# Patient Record
Sex: Female | Born: 1970 | Race: White | Hispanic: No | Smoking: Never smoker
Health system: Southern US, Academic
[De-identification: ages and names within clinical notes are randomized; demographics above are authoritative.]

## PROBLEM LIST (undated history)

## (undated) DIAGNOSIS — D649 Anemia, unspecified: Secondary | ICD-10-CM

## (undated) DIAGNOSIS — I639 Cerebral infarction, unspecified: Secondary | ICD-10-CM

## (undated) DIAGNOSIS — G4730 Sleep apnea, unspecified: Secondary | ICD-10-CM

## (undated) DIAGNOSIS — E079 Disorder of thyroid, unspecified: Secondary | ICD-10-CM

## (undated) DIAGNOSIS — Z853 Personal history of malignant neoplasm of breast: Secondary | ICD-10-CM

## (undated) DIAGNOSIS — G40909 Epilepsy, unspecified, not intractable, without status epilepticus: Secondary | ICD-10-CM

## (undated) DIAGNOSIS — E119 Type 2 diabetes mellitus without complications: Secondary | ICD-10-CM

## (undated) DIAGNOSIS — Z9989 Dependence on other enabling machines and devices: Secondary | ICD-10-CM

## (undated) DIAGNOSIS — C801 Malignant (primary) neoplasm, unspecified: Secondary | ICD-10-CM

## (undated) DIAGNOSIS — I10 Essential (primary) hypertension: Secondary | ICD-10-CM

## (undated) HISTORY — DX: Personal history of malignant neoplasm of breast: Z85.3

## (undated) HISTORY — DX: Anemia, unspecified: D64.9

## (undated) HISTORY — DX: Malignant (primary) neoplasm, unspecified: C80.1

## (undated) HISTORY — PX: KIDNEY SURGERY: SHX687

## (undated) HISTORY — DX: Essential (primary) hypertension: I10

## (undated) HISTORY — PX: HX HERNIA REPAIR: SHX51

## (undated) HISTORY — PX: GASTROJEJUNOSTOMY W/ JEJUNOSTOMY TUBE: SHX1698

## (undated) HISTORY — DX: Epilepsy, unspecified, not intractable, without status epilepticus: G40.909

## (undated) HISTORY — DX: Disorder of thyroid, unspecified: E07.9

## (undated) HISTORY — PX: HX GALL BLADDER SURGERY/CHOLE: SHX55

## (undated) HISTORY — PX: BRAIN SURGERY: SHX531

## (undated) HISTORY — DX: Type 2 diabetes mellitus without complications: E11.9

---

## 1992-12-02 ENCOUNTER — Other Ambulatory Visit (HOSPITAL_COMMUNITY): Payer: Self-pay

## 2017-01-30 NOTE — Telephone Encounter (Signed)
 Call was transferred from CL to triage nurse. [facility] calling to request copy of letter regarding retro authorization on MRI done 05/21/2016. Have routed to referrals and authorization to assist.  Maylon Cos, RN 01/30/2017 1:41 PM

## 2021-02-27 IMAGING — MR MRI BRAIN WITHOUT AND WITH CONTRAST
8 of 13 series · 25 of 48 positions shown · IV contrast (gadavist)
Comparison: MRI brain from [facility] dated 03/05/2020, & 02/15/2019

﻿EXAM:  [id number]   MRI BRAIN WITHOUT AND WITH CONTRAST
INDICATION: 50-year-old female with prior history of grade 3 astrocytoma of left frontal temporal region diagnosed in 9027 status post radiation therapy.
TECHNIQUE: Axial, coronal and sagittal images including diffusion-weighted sequence, T1 and T2 sequence, FLAIR sequence and postcontrast T1 series after administration of 10 mL Gadavist IV.

[Series 6: DWI · axial · 5.0mm · 1.35mm/px · z∈[-71,+55]mm · 2 of 22 slices shown (1 of 2)]
[im 1/22]
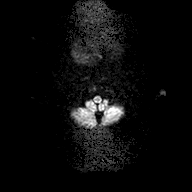
[im 22/22]
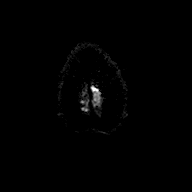

[Series 7: DWI · axial · 5.0mm · 1.35mm/px · z∈[-71,+55]mm · 2 of 21 slices shown (2 of 2)]
[im 1/21]
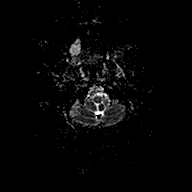
[im 21/21]
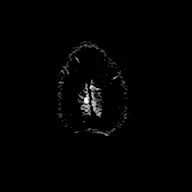

[Series 8: FLAIR · sagittal · 4.0mm · 0.75mm/px · 4 of 26 slices shown (1 of 2)]
[im 1/26]
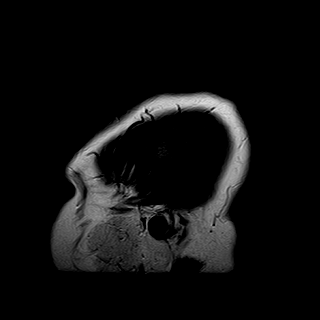
[im 9/26]
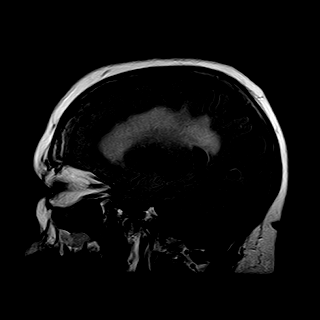
[im 17/26]
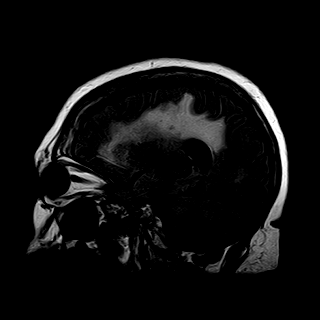
[im 26/26]
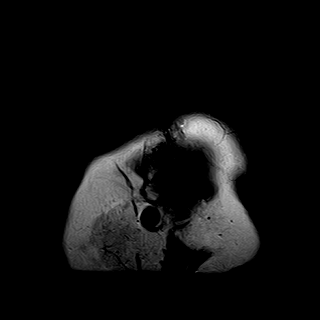

[Series 9: T2 · axial · 5.0mm · 0.43mm/px · z∈[-84,+60]mm · 4 of 25 slices shown (1 of 2)]
[im 1/25]
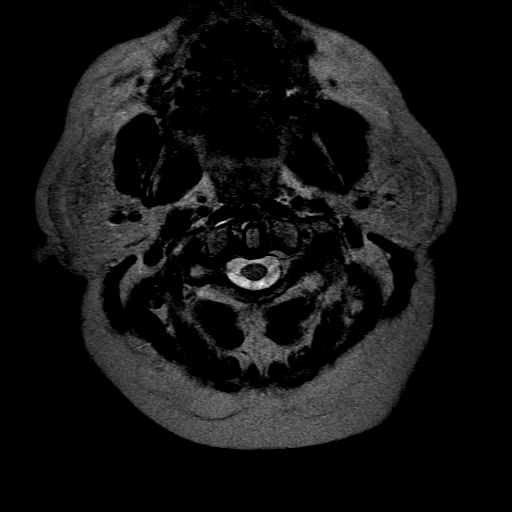
[im 9/25]
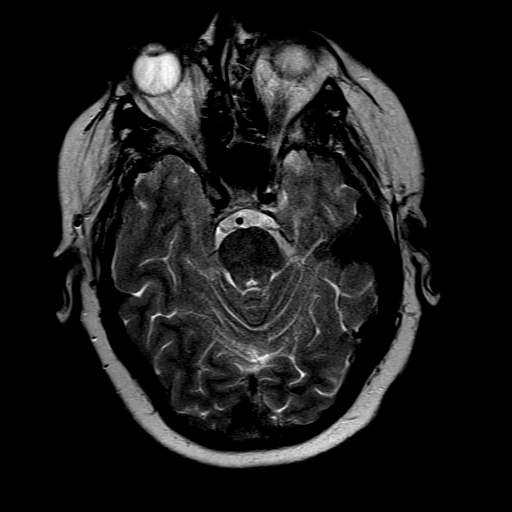
[im 17/25]
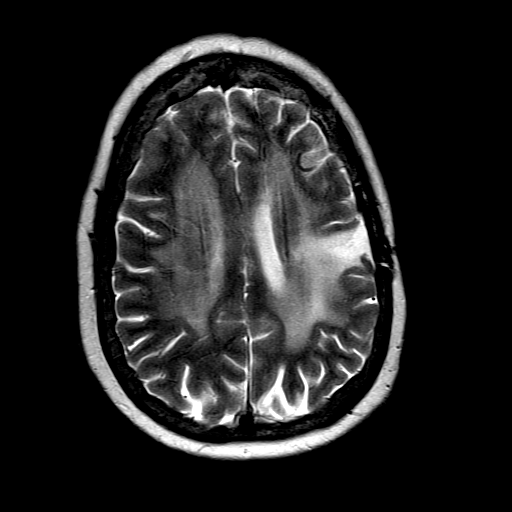
[im 25/25]
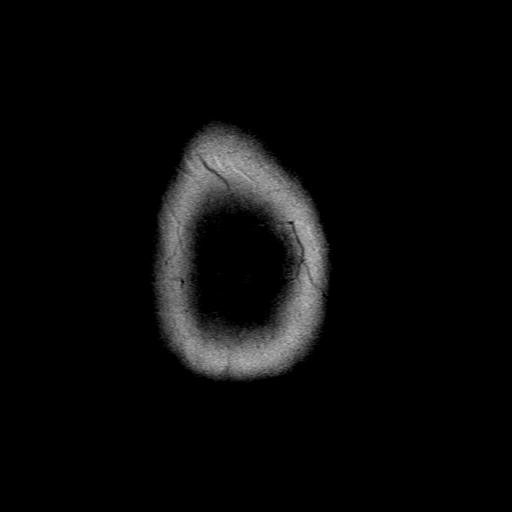

[Series 10: FLAIR · axial · 5.0mm · 0.43mm/px · z∈[-84,+60]mm · 4 of 25 slices shown (2 of 2)]
[im 1/25]
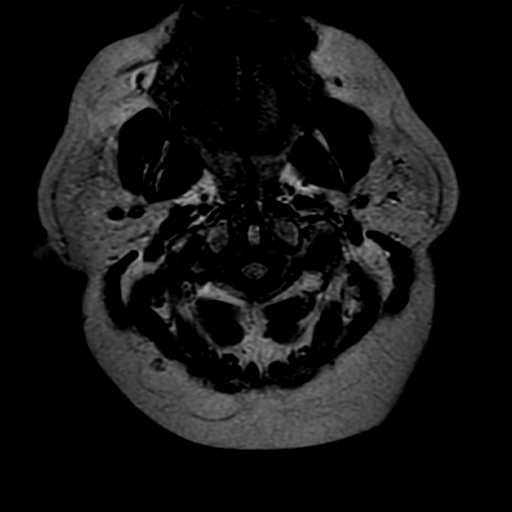
[im 9/25]
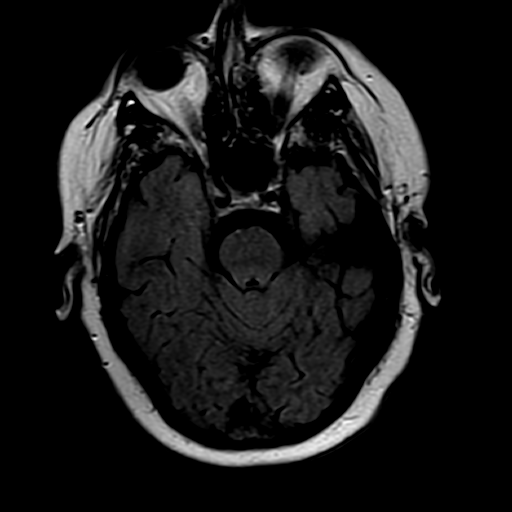
[im 17/25]
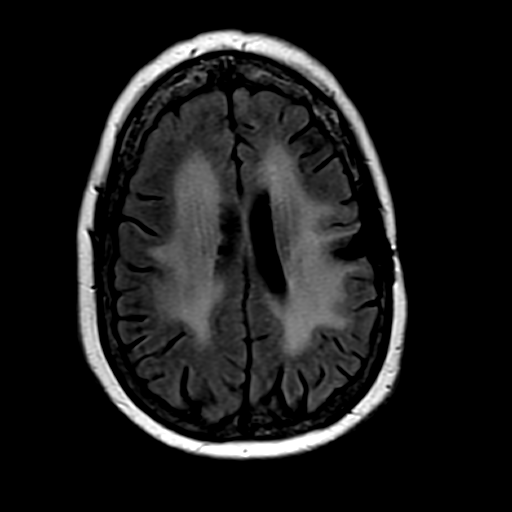
[im 25/25]
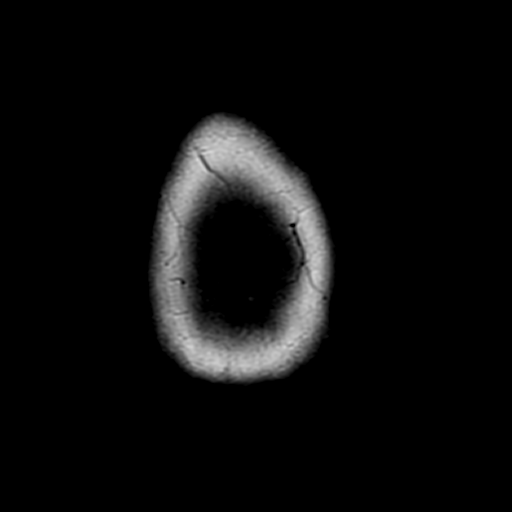

[Series 11: T1 · axial · 5.0mm · 0.43mm/px · z∈[-84,+60]mm · 4 of 25 slices shown]
[im 1/25]
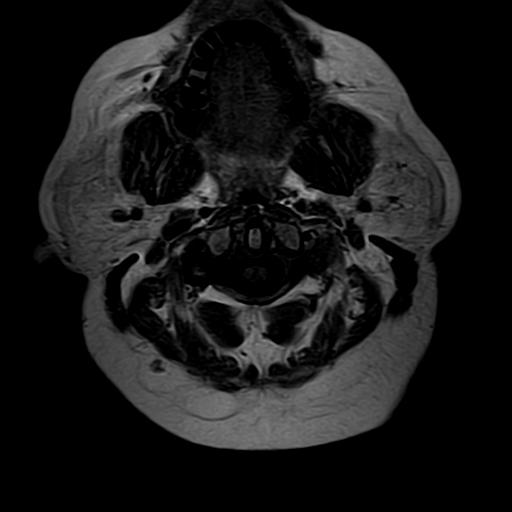
[im 9/25]
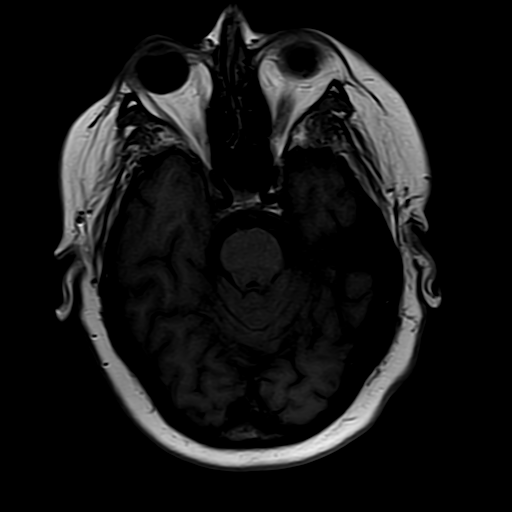
[im 17/25]
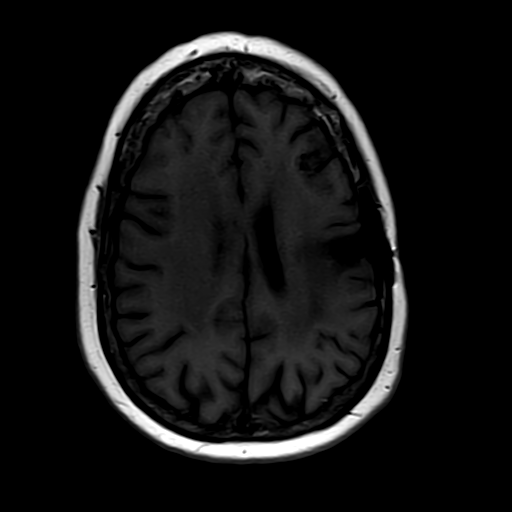
[im 25/25]
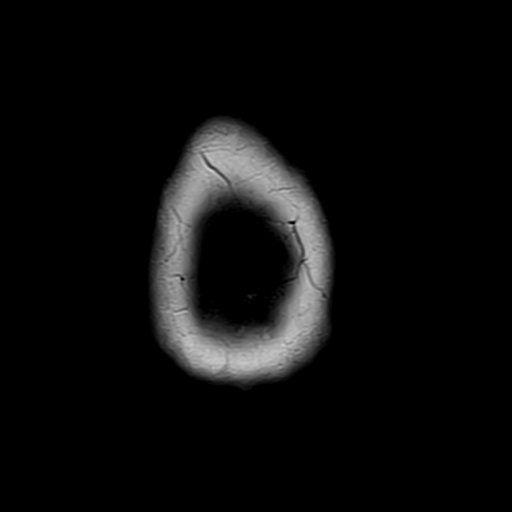

[Series 13: T2 · coronal · 6.0mm · 0.43mm/px · 4 of 24 slices shown (2 of 2)]
[im 1/24]
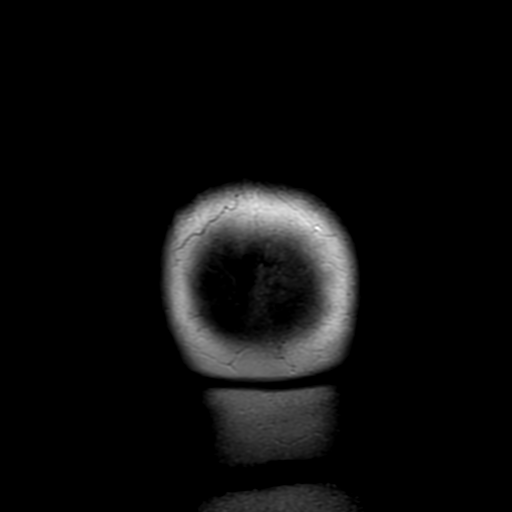
[im 8/24]
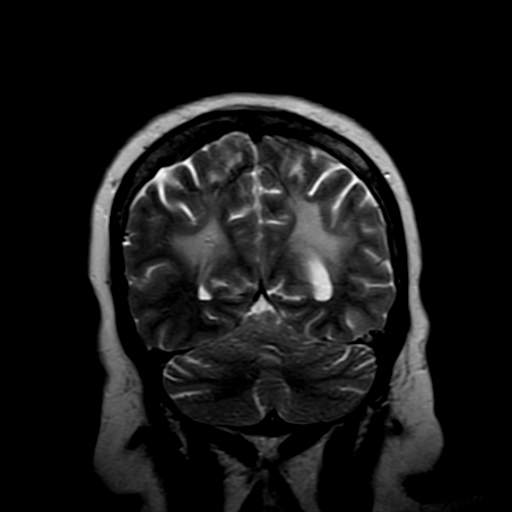
[im 16/24]
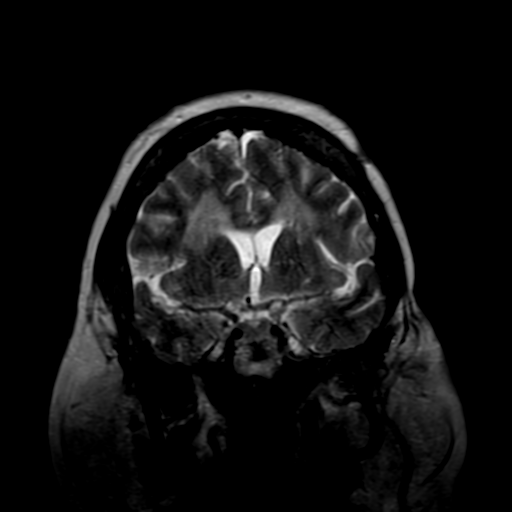
[im 24/24]
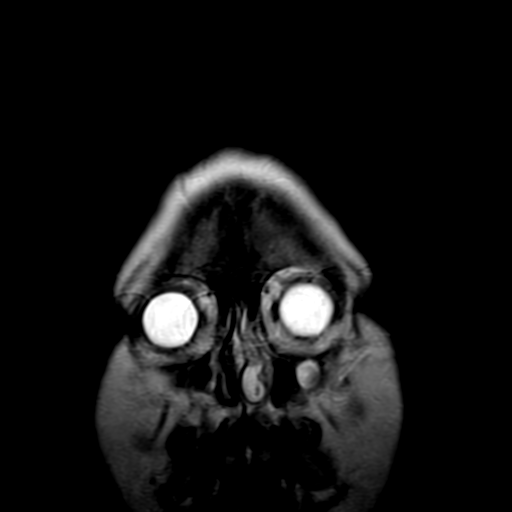

[Series 14: T1 fat-sat · coronal · 5.0mm · 0.57mm/px · 1 of 24 slices shown]
[im 1/24]
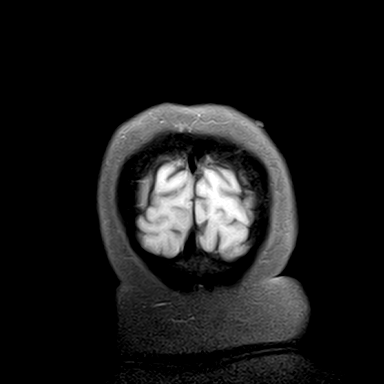

[25 of 48 positions shown; findings below may reference images not displayed]

FINDINGS: No focal areas of restricted diffusion.  No intracranial bleed or extra-axial collections.  No ventriculomegaly or midline shift.  Major arteries of circle of Willis are patent.  

Significant encephalomalacia of the left frontotemporal region is noted.  Residual cavitary lesion measures 2.3 x 1 cm in axial dimensions.  No abnormal enhancement on the postcontrast study.  Dural venous sinuses are patent.
IMPRESSION: 1. Stable encephalomalacia and post radiation changes of left frontotemporal region with stable cystic cavity.  No abnormal enhancement is noted.  

2. Chronic small vessel ischemic change of periventricular white matter on both sides.  No midline shift or intracranial bleed.  

3. No interval change noted from prior examination from [facility] dated 03/05/2020& 02/15/2019.

## 2021-04-05 ENCOUNTER — Other Ambulatory Visit: Payer: Self-pay

## 2021-04-05 ENCOUNTER — Encounter (INDEPENDENT_AMBULATORY_CARE_PROVIDER_SITE_OTHER): Payer: Self-pay

## 2021-04-05 ENCOUNTER — Ambulatory Visit (INDEPENDENT_AMBULATORY_CARE_PROVIDER_SITE_OTHER): Payer: BC Managed Care – PPO | Admitting: Hematology & Oncology

## 2021-04-05 ENCOUNTER — Encounter (INDEPENDENT_AMBULATORY_CARE_PROVIDER_SITE_OTHER): Payer: Self-pay | Admitting: Hematology & Oncology

## 2021-04-05 VITALS — BP 194/94 | HR 98 | Temp 97.9°F | Ht 62.0 in | Wt 220.7 lb

## 2021-04-05 DIAGNOSIS — D496 Neoplasm of unspecified behavior of brain: Secondary | ICD-10-CM

## 2021-04-18 ENCOUNTER — Encounter (INDEPENDENT_AMBULATORY_CARE_PROVIDER_SITE_OTHER): Payer: Self-pay | Admitting: Hematology & Oncology

## 2021-04-18 NOTE — Progress Notes (Signed)
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    HISTORY OF PRESENT ILLNESS:  Elizabeth [name] is a 51 y.o. 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  Patient was in usual state of health until August 2015 when patient had generalized seizure, patient went to the ER ,and  CT scan of the brain August 2015 showed white matter hypodensity within posterior inferior left frontal lobe, concern for malignancy, patient was transferred to [facility] hospital and  had left frontal temporal craniotomy with mapping for resection of the brain lesion on September 10, 2013 and it showed grade 3 anaplastic astrocytoma ,patient saw radiation oncologist Dr. Candee Furbish ,who recommended postoperative radiation along with Temodar which  patient got for 6 months ,patient had some speech difficulty.  Patient had MRI March 05, 2020 was negative and patient was continued on Dilantin ,patient finished radiation and Temodar October 2015.      ROS:  Brain Tumor, hypothyroidism, anemia ,seizure disorder, hypertension.    History:  Past Medical History:   Diagnosis Date   . Anemia    . Cancer (CMS [place])    . Disorder of thyroid    . 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 Essential hypertension            Past Surgical History:   Procedure Laterality Date   . BRAIN SURGERY     . HX CHOLECYSTECTOMY             Social History     Socioeconomic History   . Marital status: Divorced     Spouse name: Not on file   . Number of children: Not on file   . Years of education: Not on file   . Highest education level: Not on file   Occupational History   . Not on file   Tobacco Use   . Smoking status: Never   . Smokeless tobacco: Never   Vaping Use   . Vaping Use: Never used   Substance and  Sexual Activity   . Alcohol use: Never   . Drug use: Never   . Sexual activity: Not on file   Other Topics Concern   . Not on file   Social History Narrative   . Not on file     Social Determinants of Health     Financial Resource Strain: Not on file   Transportation Needs: Not on file   Social Connections: Not on file   Intimate Partner Violence: Not on file   Housing Stability: Not on file       Social History     Social History Narrative   . Not on file       Social History     Substance and Sexual Activity   Drug Use Never       Family Medical History:     Problem Relation (Age of Onset)    Arthritis-osteo Mother    Heart Disease Father    Hypertension (High Blood Pressure) Mother            Current Outpatient Medications   Medication Sig   . ergocalciferol, vitamin D2, (DRISDOL) 1,250 mcg (50,000 unit) Oral Capsule AS DIRECTED ONCE A  WEEK   . escitalopram oxalate (LEXAPRO) 10 mg Oral Tablet Take 1 Tablet (10 mg total) by mouth Every night   . famotidine (PEPCID) 20 mg Oral Tablet Take 1 Tablet (20 mg total) by mouth Once a day   . hydroCHLOROthiazide (HYDRODIURIL) 25 mg Oral Tablet Take 1 Tablet (25 mg total) by mouth Every morning   . levothyroxine (SYNTHROID) 25 mcg Oral Tablet TAKE 1 TABLET ONCE A DAY IN THE MORNING 30 MINUTES BEFORE MEALS OR ANY OTHER MEDICATIONS   . losartan (COZAAR) 100 mg Oral Tablet Take 1 Tablet (100 mg total) by mouth Once a day   . magnesium Oxide 420 mg Oral Tablet Take 1 Tablet (420 mg total) by mouth Once a day   . phenytoin sodium extended release (DILANTIN) 100 mg Oral Capsule TAKE 3 CAPSULES BY MOUTH TWICE A DAY       Allergies   Allergen Reactions   . Ace Inhibitors      Other reaction(s): Angioedema         PHYSICAL EXAM:  Most Recent Vitals    Flowsheet Row Office Visit from 04/05/2021 in Hematology/Oncology, [facility]   Temperature 36.6 C (97.9 F) filed at... 04/05/2021 1522   Heart Rate 98 filed at... 04/05/2021 1522   BP (Non-Invasive) 194/94 filed  at... 04/05/2021 1522   Height 1.575 m ('5\' 2"'$ ) filed at... 04/05/2021 1522   Weight 100 kg (220 lb 11.2 oz) filed at... 04/05/2021 1522   BMI (Calculated) 40.45 filed at... 04/05/2021 1522   BSA (Calculated) 2.09 filed at... 04/05/2021 1522      Physical Exam  Constitutional:       Appearance: Normal appearance.   HENT:      Head: Normocephalic and atraumatic.      Right Ear: Tympanic membrane normal.      Left Ear: Tympanic membrane normal.      Nose: Nose normal.      Mouth/Throat:      Mouth: Mucous membranes are moist.   Eyes:      Extraocular Movements: Extraocular movements intact.      Pupils: Pupils are equal, round, and reactive to light.   Cardiovascular:      Rate and Rhythm: Normal rate and regular rhythm.      Pulses: Normal pulses.      Heart sounds: Normal heart sounds.   Pulmonary:      Effort: Pulmonary effort is normal.      Breath sounds: Normal breath sounds.   Abdominal:      General: Abdomen is flat.   Musculoskeletal:         General: Normal range of motion.      Cervical back: Normal range of motion and neck supple.   Skin:     General: Skin is warm.      Capillary Refill: Capillary refill takes less than 2 seconds.   Neurological:      General: No focal deficit present.      Mental Status: She is alert and oriented to person, place, and time.   Psychiatric:         Mood and Affect: Mood normal.         Behavior: Behavior normal.         Thought Content: Thought content normal.         Judgment: Judgment normal.            ASSESSMENT:  anaplastic astrocytoma.      PLAN:  1. All relative external and internal medical records were reviewed including available H&Ps, progress notes, procedure notes, imaging's, laboratories, and pathology.   2. All labs from last visit were reviewed with the patient including CBC/differential, CMP, LFTs,details of exam finding's discussed.   Patient with history of astrocytoma, had surgery followed by Temodar and radiation, we will check CBC CMP CEA T3,T4 , TSH,  lipid profile follow up in 2 months.    Elizabeth [name] was given the chance to ask questions, and these were answered to their satisfaction. The patient is welcome to call with any questions or concerns in the meantime.     On the day of the encounter, a total of  45 minutes was spent on this patient encounter including review of historical information, examination, documentation and post-visit activities.   No follow-ups on file.     Livia Snellen, MD  04/18/2021, 20:32    CC:  Dan Humphreys, FNP-BC  [place] [id number]    Adolphus Birchwood, [address] [place]  [place],  [place] [address]      This note was partially generated using MModal Fluency Direct system, and there may be some incorrect words, spellings, and punctuation that were not noted in checking the note before saving.

## 2021-05-22 ENCOUNTER — Other Ambulatory Visit (HOSPITAL_COMMUNITY): Payer: Self-pay | Admitting: NURSE PRACTITIONER

## 2021-05-22 DIAGNOSIS — Z1231 Encounter for screening mammogram for malignant neoplasm of breast: Secondary | ICD-10-CM

## 2021-06-07 ENCOUNTER — Ambulatory Visit (HOSPITAL_COMMUNITY): Payer: Self-pay | Admitting: HEMATOLOGY-ONCOLOGY

## 2021-06-14 ENCOUNTER — Encounter (HOSPITAL_COMMUNITY): Payer: Self-pay

## 2021-06-14 ENCOUNTER — Inpatient Hospital Stay
Admission: RE | Admit: 2021-06-14 | Discharge: 2021-06-14 | Disposition: A | Discharge status: Home / Self Care | Payer: BC Managed Care – PPO | Source: Ambulatory Visit | Attending: NURSE PRACTITIONER | Admitting: NURSE PRACTITIONER

## 2021-06-14 ENCOUNTER — Other Ambulatory Visit: Payer: Self-pay

## 2021-06-14 DIAGNOSIS — Z1231 Encounter for screening mammogram for malignant neoplasm of breast: Secondary | ICD-10-CM | POA: Insufficient documentation

## 2021-07-05 ENCOUNTER — Other Ambulatory Visit: Payer: Self-pay

## 2021-07-05 ENCOUNTER — Encounter (HOSPITAL_COMMUNITY): Payer: Self-pay | Admitting: HEMATOLOGY-ONCOLOGY

## 2021-07-05 ENCOUNTER — Ambulatory Visit: Payer: BC Managed Care – PPO | Attending: HEMATOLOGY-ONCOLOGY | Admitting: HEMATOLOGY-ONCOLOGY

## 2021-07-05 VITALS — BP 158/70 | HR 76 | Temp 98.3°F | Ht 62.0 in | Wt 208.3 lb

## 2021-07-05 DIAGNOSIS — Z9221 Personal history of antineoplastic chemotherapy: Secondary | ICD-10-CM | POA: Insufficient documentation

## 2021-07-05 DIAGNOSIS — Z923 Personal history of irradiation: Secondary | ICD-10-CM | POA: Insufficient documentation

## 2021-07-05 DIAGNOSIS — Z85841 Personal history of malignant neoplasm of brain: Secondary | ICD-10-CM | POA: Insufficient documentation

## 2021-07-05 DIAGNOSIS — Z9889 Other specified postprocedural states: Secondary | ICD-10-CM | POA: Insufficient documentation

## 2021-07-05 DIAGNOSIS — Z803 Family history of malignant neoplasm of breast: Secondary | ICD-10-CM | POA: Insufficient documentation

## 2021-07-05 DIAGNOSIS — D496 Neoplasm of unspecified behavior of brain: Secondary | ICD-10-CM

## 2021-07-05 DIAGNOSIS — Z08 Encounter for follow-up examination after completed treatment for malignant neoplasm: Secondary | ICD-10-CM | POA: Insufficient documentation

## 2021-07-05 NOTE — Progress Notes (Signed)
Department of Hematology/Oncology  Progress Note   Name: Elizabeth [name]  GLO:[id number]  Date of Birth: [birth year]  Encounter Date: 07/05/2021    REFERRING PROVIDER:  Dan Humphreys, FNP-BC  [place],   [address]    REASON FOR OFFICE VISIT:  Follow Up (Anaplastic astrocytoma)       HISTORY OF PRESENT ILLNESS:  Elizabeth [name] is a 51 y.o. 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  She was treated with surgery followed by temozolomide plus radiation.  The combination therapy was finished in October 2015.    Clinically, she is been doing well since that time.  She does have some aphasia and word-finding difficulty.    ROS:   Pertinent review of systems as discussed in HPI    HISTORY:  Past Medical History:   Diagnosis Date   . Anemia    . Cancer (CMS [facility])     brain tumor 8/15 with chemo and radiation   . Disorder of thyroid    . 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 Essential hypertension          Past Surgical History:   Procedure Laterality Date   . BRAIN SURGERY     . HX CHOLECYSTECTOMY           Social History     Socioeconomic History   . Marital status: Divorced     Spouse name: Not on file   . Number of children: Not on file   . Years of education: Not on file   . Highest education level: Not on file   Occupational History   . Not on file   Tobacco Use   . Smoking status: Never   . Smokeless tobacco: Never   Vaping Use   . Vaping Use: Never used   Substance and Sexual Activity   . Alcohol use: Never   . Drug use: Never   . Sexual activity: Not on file   Other Topics Concern   . Not on file   Social History Narrative   . Not on file     Social Determinants of Health     Financial Resource Strain: Not on file   Transportation Needs: Not on file   Social Connections: Not on file   Intimate Partner Violence: Not on file   Housing Stability: Not on file     Family Medical History:     Problem Relation (Age of Onset)     Arthritis-osteo Mother    Breast Cancer Paternal Grandmother    Heart Disease Father    Hypertension (High Blood Pressure) Mother    No Known Problems Sister, Brother, Maternal Grandmother, Maternal Grandfather, Paternal Grandfather, Daughter, Son, Maternal Aunt, Maternal Uncle, Paternal Aunt, Paternal Uncle, Other          Current Outpatient Medications   Medication Sig   . atorvastatin (LIPITOR) 20 mg Oral Tablet Take 1 Tablet (20 mg total) by mouth Every night   . clobetasoL (CORMAX) 0.05 % Solution APPLY TO SCALP EVERY DAY TWO WEEKS OUT OF THE MONTH OR LESS   . ergocalciferol, vitamin D2, (DRISDOL) 1,250 mcg (50,000 unit) Oral Capsule AS DIRECTED ONCE A WEEK   . escitalopram oxalate (LEXAPRO) 10 mg Oral Tablet Take 1 Tablet (10 mg total) by mouth Every night   . famotidine (PEPCID) 20 mg Oral Tablet Take 1 Tablet (20 mg total) by mouth Once a  day   . FREESTYLE LANCETS 28 gauge Misc TEST ONCE DAILY   . FREESTYLE LITE METER Kit EVERY DAY   . FREESTYLE LITE STRIPS Strip TEST EVERY DAY   . hydroCHLOROthiazide (HYDRODIURIL) 25 mg Oral Tablet Take 1 Tablet (25 mg total) by mouth Every morning   . ketoconazole (NIZORAL) 2 % Shampoo PLEASE SEE ATTACHED FOR DETAILED DIRECTIONS   . levothyroxine (SYNTHROID) 25 mcg Oral Tablet TAKE 1 TABLET ONCE A DAY IN THE MORNING 30 MINUTES BEFORE MEALS OR ANY OTHER MEDICATIONS   . losartan (COZAAR) 100 mg Oral Tablet Take 1 Tablet (100 mg total) by mouth Once a day   . magnesium Oxide 420 mg Oral Tablet Take 1 Tablet (420 mg total) by mouth Once a day   . metFORMIN (GLUCOPHAGE) 500 mg Oral Tablet Take 1 Tablet (500 mg total) by mouth Twice daily   . phenytoin sodium extended release (DILANTIN) 100 mg Oral Capsule TAKE 3 CAPSULES BY MOUTH TWICE A DAY     Allergies   Allergen Reactions   . Ace Inhibitors      Other reaction(s): Angioedema       PHYSICAL EXAM:  Most Recent Vitals    Flowsheet Row Office Visit from 07/05/2021 in Hematology/Oncology, [facility]    Temperature 36.8 C (98.3 F) filed at... 07/05/2021 1548   Heart Rate 76 filed at... 07/05/2021 1548   Respiratory Rate --   BP (Non-Invasive) 158/70 filed at... 07/05/2021 1548   SpO2 --   Height 1.575 m (5' 2" ) filed at... 07/05/2021 1548   Weight 94.5 kg (208 lb 4.8 oz) filed at... 07/05/2021 1548   BMI (Calculated) 38.18 filed at... 07/05/2021 1548   BSA (Calculated) 2.03 filed at... 07/05/2021 1548      ECOG Status: (1) Restricted in physically strenuous activity, ambulatory and able to do work of light nature   Physical Exam  Constitutional:       General: She is not in acute distress.     Appearance: Normal appearance.   Eyes:      Extraocular Movements: Extraocular movements intact.   Cardiovascular:      Rate and Rhythm: Normal rate and regular rhythm.   Pulmonary:      Effort: Pulmonary effort is normal.   Abdominal:      General: Abdomen is flat.      Palpations: Abdomen is soft.   Musculoskeletal:         General: Normal range of motion.      Cervical back: Normal range of motion.   Skin:     General: Skin is warm and dry.   Neurological:      General: No focal deficit present.      Mental Status: She is alert.   Psychiatric:         Mood and Affect: Mood normal.         DIAGNOSTIC DATA:  No results found for this or any previous visit (from the past 17520 hour(s)).    LABS:   CBC  Diff   No results found for: WBC, WBCJ, HGB, HCT, PLTCNT, SEDRATE, ESR, RBC, MCV, MCHC, MCH, RDW, MPV No results found for: PMNS, LYMPHOCYTES, EOSINOPHIL, MONOCYTES, BASOPHILS, PMNABS, LYMPHSABS, EOSABS, MONOSABS, BASOSABS, BASABS         Comprehensive Metabolic Profile    No results found for: SODIUM, POTASSIUM, CHLORIDE, CO2, ANIONGAP, BUN, CREATININE, ALBUMIN, CALCIUM, GLUCOSENF, GLUCOSEFAST, ALKPHOS, ALT, AST, TOTBILIRUBIN, TOTALPROTEIN       BASIC METABOLIC  PANEL  No results found for: SODIUM, POTASSIUM, CHLORIDE, CO2, ANIONGAP, BUN, CREATININE, BUNCRRATIO, GFR, CALCIUM, GLUCOSENF        ASSESSMENT:  Problem List Items  Addressed This Visit    None  Visit Diagnoses     Brain tumor (CMS [place])    -  Primary           ICD-10-CM    1. Brain tumor (CMS [place])  D49.6            PLAN:   1. All relevant medical records were reviewed including available pertinent provider notes, procedure notes, imaging, laboratory, and pathology.   2. All pertinent labs and/or imaging were reviewed with the patient.   3. Anaplastic astrocytoma:  The patient received definitive treat 8 years ago and has been stable since that time.  At this point, we will see her back every 6 months for ongoing surveillance.    Anyeli Nakama was given the chance to ask questions, and these were answered to their satisfaction. The patient is welcome to call with any questions or concerns in the meantime.     On the day of the encounter, a total of 35 minutes was spent on this patient encounter including review of historical information, examination, documentation and post-visit activities.   Return in about 6 months (around 01/04/2022).     Narda Rutherford, MD  07/05/2021 , 16:57    CC:  [phone] [place] [address]    Dan Humphreys, FNP-BC  [place],  [place] [address]    This note was partially generated using MModal Fluency Direct system, and there may be some incorrect words, spellings, and punctuation that were not noted in checking the note before saving.

## 2021-09-13 ENCOUNTER — Ambulatory Visit (HOSPITAL_BASED_OUTPATIENT_CLINIC_OR_DEPARTMENT_OTHER): Payer: Self-pay

## 2021-09-18 ENCOUNTER — Other Ambulatory Visit: Payer: Self-pay

## 2021-09-20 ENCOUNTER — Other Ambulatory Visit: Payer: Self-pay

## 2021-09-20 ENCOUNTER — Inpatient Hospital Stay
Admission: RE | Admit: 2021-09-20 | Discharge: 2021-09-20 | Disposition: A | Discharge status: Still In Hospital | Payer: BC Managed Care – PPO | Source: Ambulatory Visit | Attending: Radiation Oncology | Admitting: Radiation Oncology

## 2021-09-20 NOTE — Progress Notes (Signed)
Name: Elizabeth [name] MRN:  [id number]   Date: 09/20/2021 Age: 51 y.o.        TUMOR:  Grade 3 left frontotemporal astrocytoma.    TREATMENT INTERVAL:  November 23, 2013 she completed a 2 phase IMRT plan delivering 45 Gy to the initial volume and 59.4 Gy to the boost volume along with concurrent temozolomide chemotherapy.    CHIEF COMPLAINT:  Routine follow-up.    INTERVAL ONCOLOGIC HISTORY:  I saw her on September 07, 2020.  She continues to have an expressive aphasia and mild receptive aphasia as well as right leg weakness.  She is had no further seizures and no visual problems or headaches.    OTHER MEDICAL PROBLEMS:  She has been started on medication for diabetes.  She continues on medication for seizures, thyroid replacement, depression, blood pressure, and reflux.    PAST MEDICAL HISTORY    ALLERGIES:   Allergies   Allergen Reactions    Ace Inhibitors      Other reaction(s): Angioedema        MEDICATIONS:   Current Outpatient Medications   Medication Instructions    atorvastatin (LIPITOR) 20 mg, Oral, NIGHTLY    clobetasoL (CORMAX) 0.05 % Solution APPLY TO SCALP EVERY DAY TWO WEEKS OUT OF THE MONTH OR LESS    ergocalciferol, vitamin D2, (DRISDOL) 1,250 mcg (50,000 unit) Oral Capsule AS DIRECTED ONCE A WEEK    escitalopram oxalate (LEXAPRO) 10 mg, Oral, NIGHTLY    famotidine (PEPCID) 20 mg, Oral, DAILY    FREESTYLE LANCETS 28 gauge Misc TEST ONCE DAILY    FREESTYLE LITE METER Kit EVERY DAY    FREESTYLE LITE STRIPS Strip TEST EVERY DAY    hydroCHLOROthiazide (HYDRODIURIL) 25 mg, Oral, EVERY MORNING    ketoconazole (NIZORAL) 2 % Shampoo PLEASE SEE ATTACHED FOR DETAILED DIRECTIONS    levothyroxine (SYNTHROID) 25 mcg Oral Tablet TAKE 1 TABLET ONCE A DAY IN THE MORNING 30 MINUTES BEFORE MEALS OR ANY OTHER MEDICATIONS    losartan (COZAAR) 100 mg, Oral, DAILY    magnesium Oxide 420 mg, Oral, DAILY    metFORMIN (GLUCOPHAGE) 500 mg, Oral, 2 TIMES DAILY    phenytoin sodium extended release (DILANTIN) 100 mg Oral Capsule TAKE 3  CAPSULES BY MOUTH TWICE A DAY        INTERVAL SURGERIES:   Past Surgical History:   Procedure Laterality Date    BRAIN SURGERY      HX CHOLECYSTECTOMY          REVIEW OF SYSTEMS  General: No anorexia, weight loss, chills, night sweats, or documented fevers.  Eyes: No loss of acuity, diplopia, cataracts, glaucoma, macular degeneration.  Ears: No loss of acuity, tinnitus, otalgia, eustachian tube dysfunction.  Endocrine: No diabetes, thyroid problems, corticosteroid use.  Respiratory: No coughing, wheezing, shortness of breath, asthma, emphysema, tuberculosis, black lung, asbestos exposure.  Cardiovascular: No syncope, palpitations, angina, hypertension, hyperlipidemia, myocardial infarction, deep vein thrombosis, history of rheumatic fever.  GI: No dysphagia, abdominal pain, nausea, vomiting, diarrhea, constipation, rectal bleeding.  GU: No kidney stones, urinary infections, urinary leakage.  Musculoskeletal: No back pain, joint pain, broken bones.  Psychiatric: No anxiety, depression.  Neurologic: No headaches, stroke, seizures, paresthesias, weakness.      PHYSICAL EXAMINATION  General:  Ill-appearing  Performance Status:  ECOG 1  Vital Signs: There were no vitals taken for this visit.   Skin: No jaundice, urticaria, pigmented lesions, or radiation changes.  Oral: No visible or palpable lesions of the oral cavity or oropharynx.  Extremities: No clubbing, cyanosis, edema, or arthritic deformities.  Psychiatric: Alert, oriented, and cooperative, with appropriate judgment and affect.  Neurologic: Cranial nerves II through XII intact.  Motor strength 5/5 and equal.  Rapid alternating motions are unimpaired. Gait normal.    RADIOGRAPHS:   MRI brain report February 26, 2021 described stable encephalomalacia with a residual 10 x 23 mm left frontotemporal cavity.  There was no change from prior studies and no evidence of recurrent brain tumor.    COMMENT:  It has been 8 years from diagnosis of brain tumor in August  2015.    She appears clinically disease-free.      She is due for another MRI in January.  I will see her a couple of weeks after that.      Charlotte Crumb, MD       ML:[id number]@

## 2021-09-20 NOTE — Addendum Note (Signed)
Encounter addended by: Milas Kocher, RN on: 09/20/2021 1:45 PM   Actions taken: Flowsheet accepted

## 2022-01-03 ENCOUNTER — Encounter (HOSPITAL_COMMUNITY): Payer: Self-pay | Admitting: NURSE PRACTITIONER

## 2022-01-03 ENCOUNTER — Ambulatory Visit: Payer: BC Managed Care – PPO | Attending: NURSE PRACTITIONER | Admitting: NURSE PRACTITIONER

## 2022-01-03 ENCOUNTER — Other Ambulatory Visit: Payer: Self-pay

## 2022-01-03 VITALS — BP 166/78 | HR 78 | Temp 96.2°F | Ht 62.0 in | Wt 204.5 lb

## 2022-01-03 DIAGNOSIS — Z85841 Personal history of malignant neoplasm of brain: Secondary | ICD-10-CM | POA: Insufficient documentation

## 2022-01-03 DIAGNOSIS — Z853 Personal history of malignant neoplasm of breast: Secondary | ICD-10-CM | POA: Insufficient documentation

## 2022-01-03 DIAGNOSIS — R4701 Aphasia: Secondary | ICD-10-CM | POA: Insufficient documentation

## 2022-01-03 DIAGNOSIS — Z803 Family history of malignant neoplasm of breast: Secondary | ICD-10-CM | POA: Insufficient documentation

## 2022-01-03 DIAGNOSIS — Z923 Personal history of irradiation: Secondary | ICD-10-CM | POA: Insufficient documentation

## 2022-01-03 DIAGNOSIS — Z08 Encounter for follow-up examination after completed treatment for malignant neoplasm: Secondary | ICD-10-CM | POA: Insufficient documentation

## 2022-01-03 DIAGNOSIS — D496 Neoplasm of unspecified behavior of brain: Secondary | ICD-10-CM

## 2022-01-03 NOTE — Cancer Center Note (Signed)
Department of Hematology/Oncology  Progress Note   Name: Elizabeth [name]  BEE:[id number]  Date of Birth: 1970-11-11  Encounter Date: 01/03/2022    REFERRING PROVIDER:  No referring provider defined for this encounter.    REASON FOR OFFICE VISIT:  Follow Up (Brain tumor)       HISTORY OF PRESENT ILLNESS:  Elizabeth [name] is a 51 y.o. 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  She was treated with surgery followed by temozolomide plus radiation.  The combination therapy was finished in October 2015.    Clinically, she is been doing well since that time.  She does have some aphasia and word-finding difficulty. She reports some right lower back pain that developed this am upon awakening and believes it occurred while helping her daughter bathe. She additionally added she has intermittent chest pain that comes and goes without any chest pain occurences today. She also adds she recently discontinued her prescribed levothyroxine ordered per her PCP without there knowledge.     ROS:   Pertinent review of systems as discussed in HPI    HISTORY:  Past Medical History:   Diagnosis Date    Anemia     Cancer (CMS [facility])     brain tumor 8/15 with chemo and radiation    Disorder of thyroid     Epileptic seizure (CMS HCC)     Essential hypertension     Hx of breast cancer        Past Surgical History:   Procedure Laterality Date    BRAIN SURGERY      HX CHOLECYSTECTOMY           Social History     Socioeconomic History    Marital status: Divorced     Spouse name: Not on file    Number of children: Not on file    Years of education: Not on file    Highest education level: Not on file   Occupational History    Not on file   Tobacco Use    Smoking status: Never    Smokeless tobacco: Never   Vaping Use    Vaping Use: Never used   Substance and Sexual Activity    Alcohol use: Never    Drug use: Never    Sexual activity: Not on file   Other Topics  Concern    Not on file   Social History Narrative    Not on file     Social Determinants of Health     Financial Resource Strain: Not on file   Transportation Needs: Not on file   Social Connections: Not on file   Intimate Partner Violence: Not on file   Housing Stability: Not on file     Family Medical History:       Problem Relation (Age of Onset)    Arthritis-osteo Mother    Breast Cancer Paternal Grandmother    Heart Disease Father    Hypertension (High Blood Pressure) Mother    No Known Problems Sister, Brother, Maternal Grandmother, Maternal Grandfather, Paternal Grandfather, Daughter, Son, Maternal Aunt, Maternal Uncle, Paternal Aunt, Paternal Uncle, Other            Current Outpatient Medications   Medication Sig    atorvastatin (LIPITOR) 20 mg Oral Tablet Take 1 Tablet (20 mg total) by mouth Every night    clobetasoL (CORMAX) 0.05 % Solution APPLY TO SCALP EVERY DAY TWO WEEKS OUT  OF THE MONTH OR LESS    ergocalciferol, vitamin D2, (DRISDOL) 1,250 mcg (50,000 unit) Oral Capsule AS DIRECTED ONCE A WEEK    escitalopram oxalate (LEXAPRO) 10 mg Oral Tablet Take 1 Tablet (10 mg total) by mouth Every night    famotidine (PEPCID) 20 mg Oral Tablet Take 1 Tablet (20 mg total) by mouth Once a day    FREESTYLE LANCETS 28 gauge Misc TEST ONCE DAILY    FREESTYLE LITE METER Kit EVERY DAY    FREESTYLE LITE STRIPS Strip TEST EVERY DAY    hydroCHLOROthiazide (HYDRODIURIL) 25 mg Oral Tablet Take 1 Tablet (25 mg total) by mouth Every morning    ketoconazole (NIZORAL) 2 % Shampoo PLEASE SEE ATTACHED FOR DETAILED DIRECTIONS    levothyroxine (SYNTHROID) 25 mcg Oral Tablet TAKE 1 TABLET ONCE A DAY IN THE MORNING 30 MINUTES BEFORE MEALS OR ANY OTHER MEDICATIONS    losartan (COZAAR) 100 mg Oral Tablet Take 1 Tablet (100 mg total) by mouth Once a day    magnesium Oxide 420 mg Oral Tablet Take 1 Tablet (420 mg total) by mouth Once a day    metFORMIN (GLUCOPHAGE) 500 mg Oral Tablet Take 1 Tablet (500 mg total) by mouth Twice daily     methocarbamoL (ROBAXIN) 500 mg Oral Tablet Take 1 Tablet (500 mg total) by mouth Three times a day as needed    phenytoin sodium extended release (DILANTIN) 100 mg Oral Capsule TAKE 3 CAPSULES BY MOUTH TWICE A DAY     Allergies   Allergen Reactions    Ace Inhibitors      Other reaction(s): Angioedema       PHYSICAL EXAM:  BP (!) 166/78 (Site: Left, Patient Position: Sitting, Cuff Size: Adult)   Pulse 78   Temp (!) 35.7 C (96.2 F) (Temporal)   Ht 1.575 m (_0 )   Wt 92.8 kg (204 lb 8 oz)   SpO2 99%   BMI 37.40 kg/m        ECOG Status: (1) Restricted in physically strenuous activity, ambulatory and able to do work of light nature   Physical Exam  Constitutional:       General: She is not in acute distress.     Appearance: Normal appearance.   Eyes:      Extraocular Movements: Extraocular movements intact.   Cardiovascular:      Rate and Rhythm: Normal rate and regular rhythm.   Pulmonary:      Effort: Pulmonary effort is normal.   Abdominal:      Palpations: Abdomen is soft.      Tenderness: There is abdominal tenderness (Right upper quadrant and left flank). There is no right CVA tenderness or left CVA tenderness.   Musculoskeletal:         General: Normal range of motion.      Cervical back: Normal range of motion.   Skin:     General: Skin is warm and dry.   Neurological:      General: No focal deficit present.      Mental Status: She is oriented to person, place, and time.      Gait: Gait abnormal (Right side cane use).   Psychiatric:         Mood and Affect: Mood normal. Affect is flat.         Cognition and Memory: Memory is impaired.      Comments: Dysphagia Chronic and unchanged            ASSESSMENT:  Problem List Items Addressed This Visit    None  Visit Diagnoses       Brain tumor (CMS [facility])    -  Primary               ICD-10-CM    1. Brain tumor (CMS [place])  D49.6              PLAN:   1. All relevant medical records were reviewed including available pertinent provider notes, procedure notes,  imaging, laboratory, and pathology.   2. Anaplastic astrocytoma:  The patient received definitive treat 8 years ago and has been stable since that time.  At this point, we will see her back every 6 months for ongoing surveillance.    Renelle Lodato was given the chance to ask questions, and these were answered to their satisfaction. The patient is welcome to call with any questions or concerns in the meantime.     On the day of the encounter, a total of 32 minutes was spent on this patient encounter including review of historical information, examination, documentation and post-visit activities.   Return in about 6 months (around 07/04/2022).     Juwann Sherk, APRN,FNP-BC ,01/03/2022 ,14:18     CC:  [facility]  [address]  [place] [address]    No referring provider defined for this encounter.    This note was partially generated using MModal Fluency Direct system, and there may be some incorrect words, spellings, and punctuation that were not noted in checking the note before saving.

## 2022-02-27 ENCOUNTER — Other Ambulatory Visit: Payer: Self-pay

## 2022-02-27 ENCOUNTER — Other Ambulatory Visit: Payer: BC Managed Care – PPO | Attending: RADIATION ONCOLOGY

## 2022-02-27 DIAGNOSIS — C719 Malignant neoplasm of brain, unspecified: Secondary | ICD-10-CM | POA: Insufficient documentation

## 2022-02-28 LAB — BUN: BUN: 14 mg/dL (ref 7–25)

## 2022-02-28 LAB — CREATININE WITH EGFR
CREATININE: 0.61 mg/dL (ref 0.60–1.30)
ESTIMATED GFR: 107 mL/min/{1.73_m2} (ref >59)

## 2022-03-04 IMAGING — MR MRI BRAIN WITHOUT AND WITH CONTRAST
11 series · 42 of 48 positions shown · IV contrast (gadavist)
Comparison: MRI brain dated 02/27/2021, 03/05/2020 with and without contrast.

﻿EXAM:  MRI BRAIN WITHOUT AND WITH CONTRAST
INDICATION: 51-year-old with diagnosis of astrocytoma status post surgery and radiation, chemotherapy in 7425.  Follow-up evaluation.
TECHNIQUE: Multiplanar, multisequential MRI of the brain was performed without and with 10 mL of Gadavist.

[Series 5: DWI · axial · 5.0mm · 1.35mm/px · z∈[-60,+66]mm · 9 of 88 slices shown (1 of 3)]
[im 1/88]
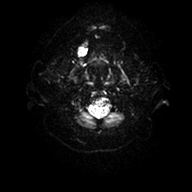
[im 16/88]
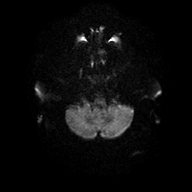
[im 24/88]
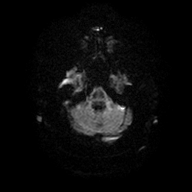
[im 40/88]
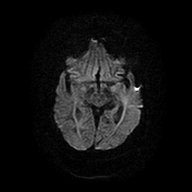
[im 48/88]
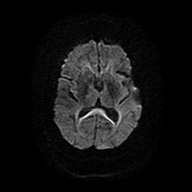
[im 64/88]
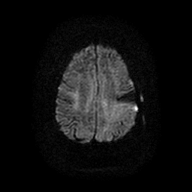
[im 72/88]
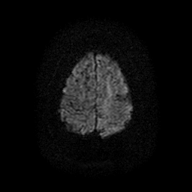
[im 80/88]
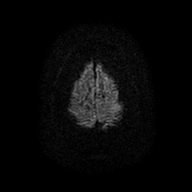
[im 88/88]
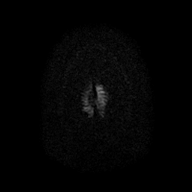

[Series 6: DWI · axial · 5.0mm · 1.35mm/px · z∈[-60,+66]mm · 2 of 22 slices shown (2 of 3)]
[im 1/22]
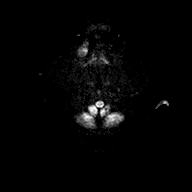
[im 22/22]
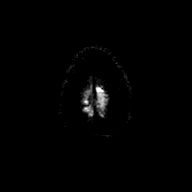

[Series 7: DWI · axial · 5.0mm · 1.35mm/px · z∈[-60,+66]mm · 3 of 22 slices shown (3 of 3)]
[im 1/22]
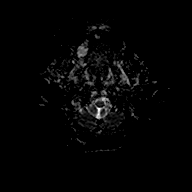
[im 11/22]
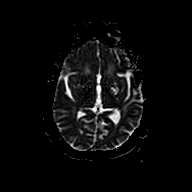
[im 22/22]
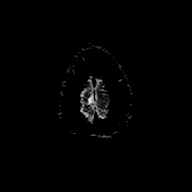

[Series 8: FLAIR · sagittal · 4.0mm · 0.75mm/px · 4 of 26 slices shown (1 of 2)]
[im 1/26]
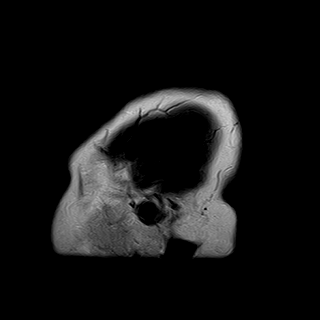
[im 9/26]
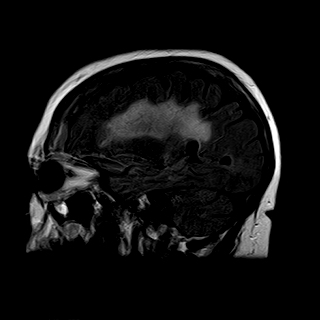
[im 17/26]
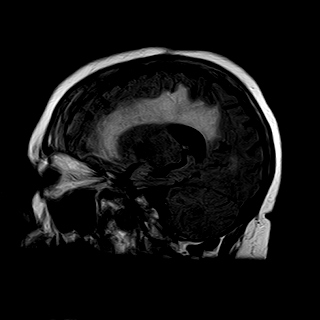
[im 26/26]
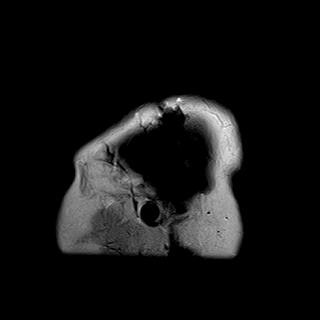

[Series 9: T2 · axial · 5.0mm · 0.43mm/px · z∈[-64,+74]mm · 4 of 24 slices shown (1 of 2)]
[im 1/24]
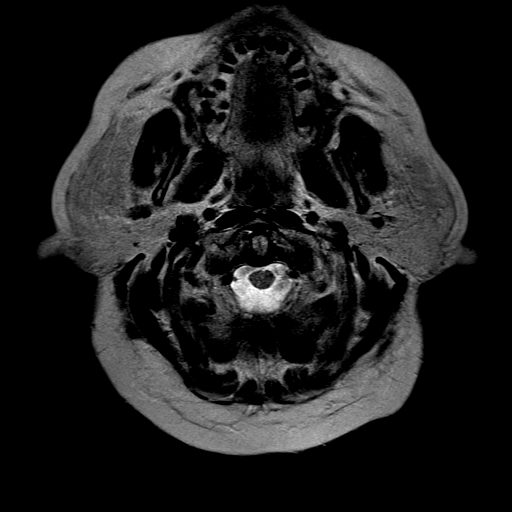
[im 8/24]
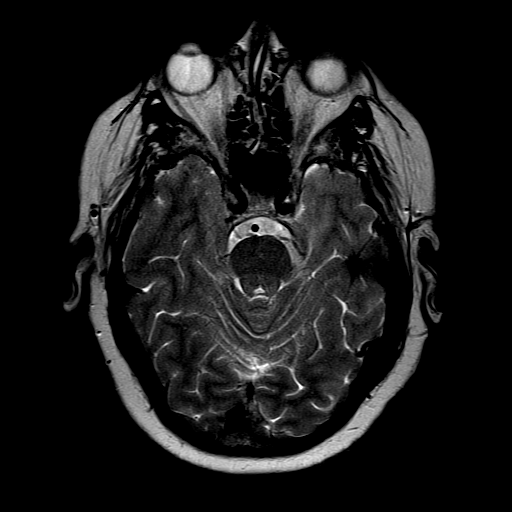
[im 16/24]
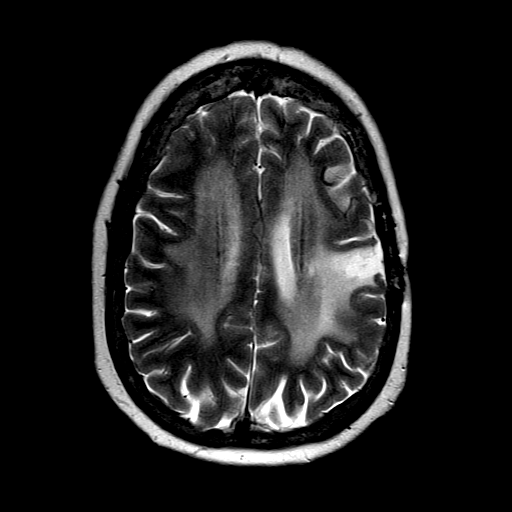
[im 24/24]
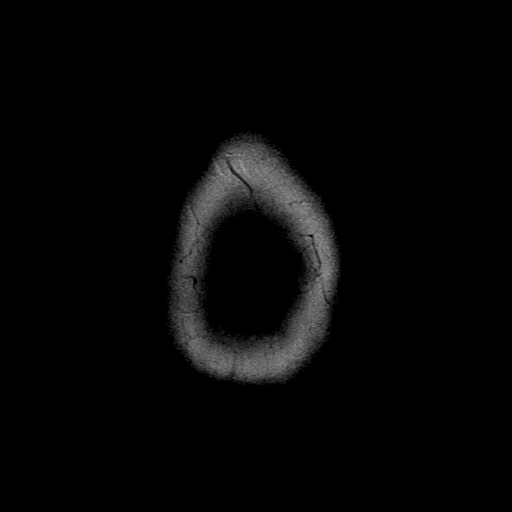

[Series 10: T2-star · axial · 5.0mm · 0.69mm/px · 1 of 24 slices shown]
[im 1/24]
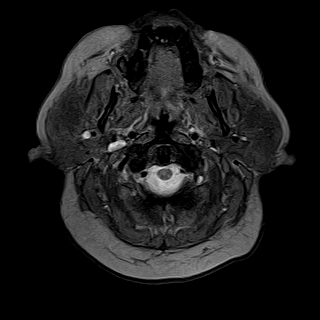

[Series 11: FLAIR · axial · 5.0mm · 0.76mm/px · z∈[-58,+68]mm · 3 of 22 slices shown (2 of 2)]
[im 1/22]
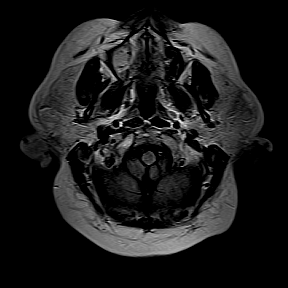
[im 11/22]
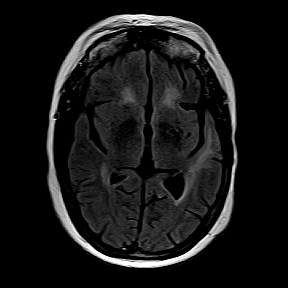
[im 22/22]
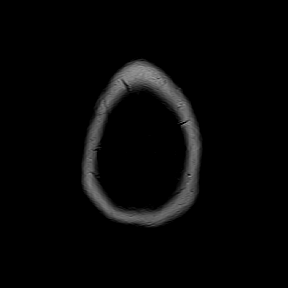

[Series 12: T2 · coronal · 6.0mm · 0.43mm/px · 4 of 24 slices shown (2 of 2)]
[im 1/24]
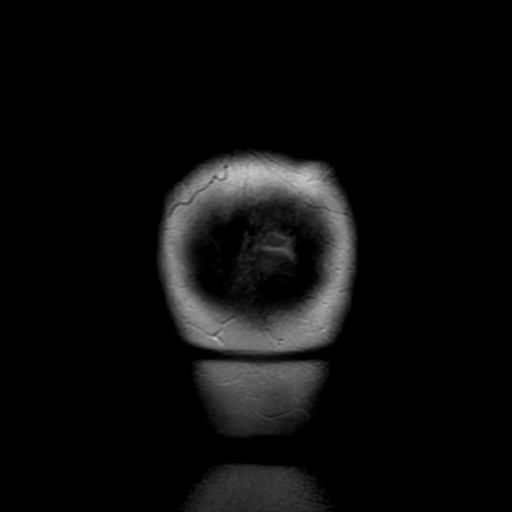
[im 8/24]
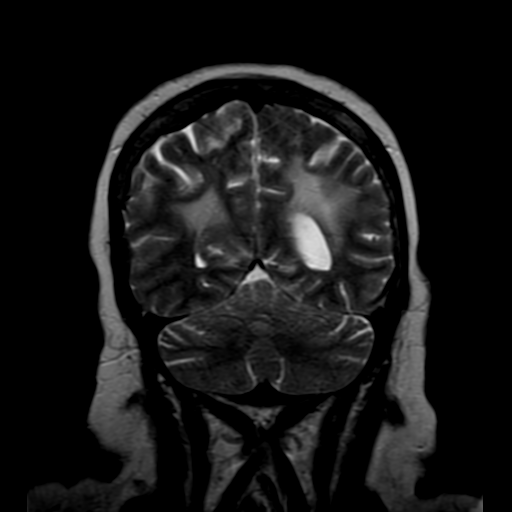
[im 16/24]
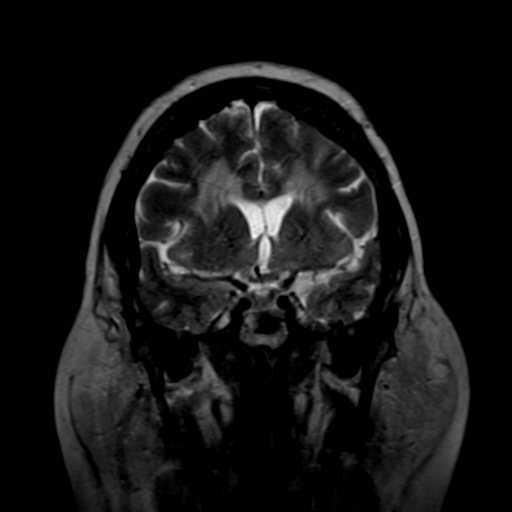
[im 24/24]
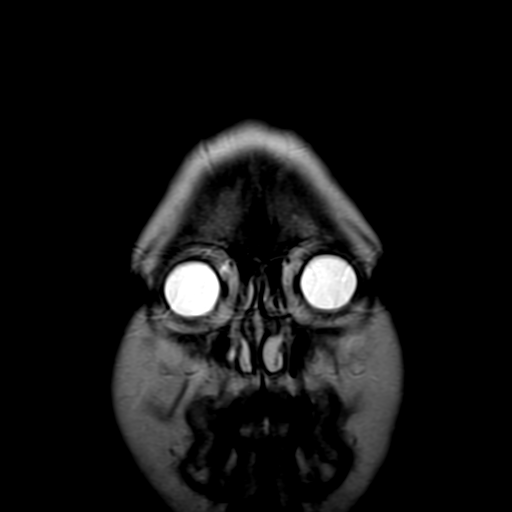

[Series 13: T1 · axial · non-contrast · 5.0mm · 0.69mm/px · z∈[-64,+74]mm · 4 of 24 slices shown (1 of 2)]
[im 1/24]
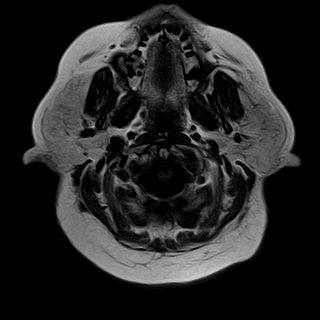
[im 8/24]
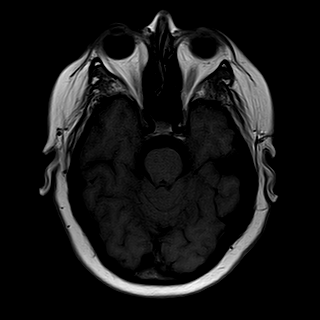
[im 16/24]
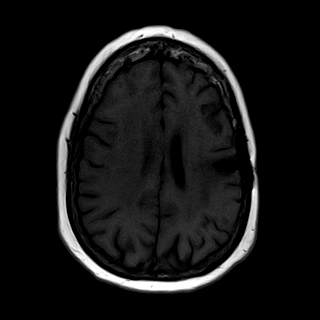
[im 24/24]
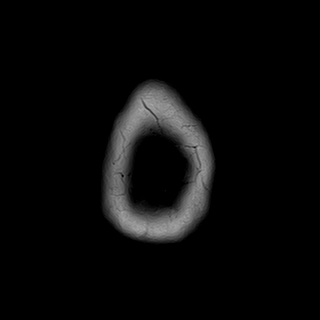

[Series 15: T1 fat-sat post-contrast · coronal · 5.0mm · 0.57mm/px · 4 of 24 slices shown]
[im 1/24]
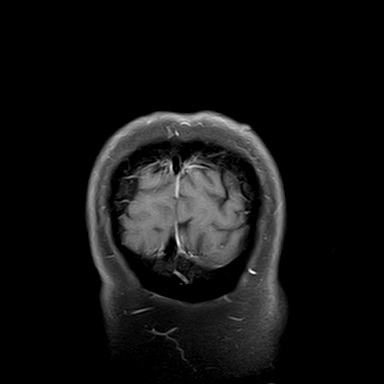
[im 8/24]
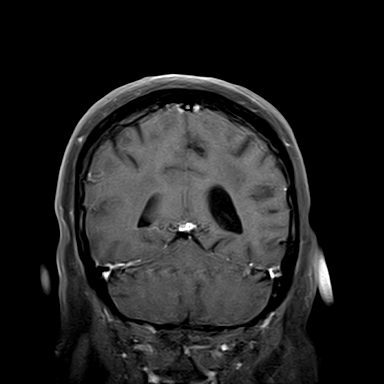
[im 16/24]
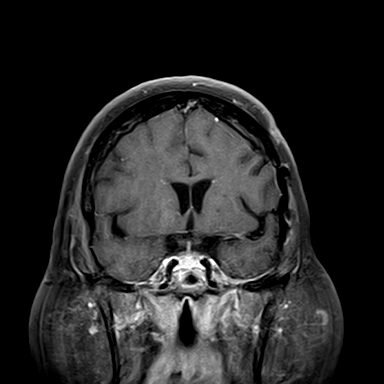
[im 24/24]
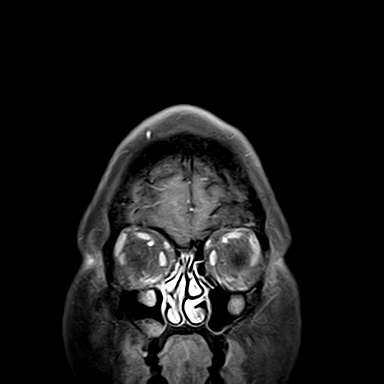

[Series 16: T1 · axial · 5.0mm · 0.69mm/px · z∈[-79,+59]mm · 4 of 24 slices shown (2 of 2)]
[im 1/24]
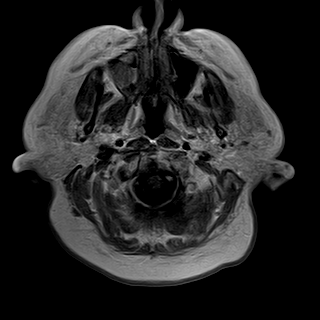
[im 8/24]
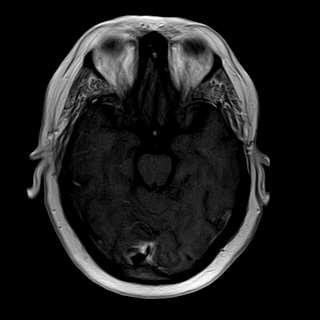
[im 16/24]
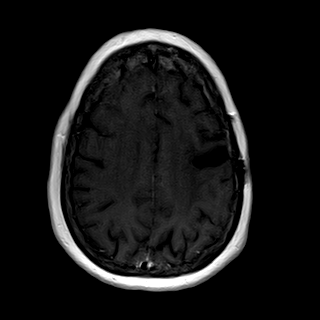
[im 24/24]
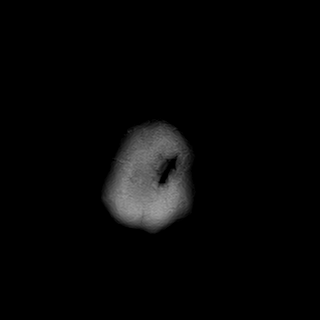

[42 of 48 positions shown; findings below may reference images not displayed]

FINDINGS: No focal areas of restricted diffusion on diffusion sequence.  Stable encephalomalacia of left frontoparietal region representing postsurgical changes and postradiation changes. No abnormal enhancement is noted in this region on postcontrast study.

Chronic ischemic change of periventricular white matter on both sides.  Major arteries of circle of Willis and dural venous sinuses are patent.  No ventriculomegaly or midline shift.
IMPRESSION: 1. Stable chronic encephalomalacia representing posttreatment changes of left frontoparietal region.  No abnormal enhancement is noted on postcontrast study.

2. Stable chronic small-vessel ischemic change of periventricular white matter.  No significant change in the MRI findings from 03/05/2020 and 02/27/2021.

## 2022-03-11 ENCOUNTER — Ambulatory Visit (HOSPITAL_BASED_OUTPATIENT_CLINIC_OR_DEPARTMENT_OTHER): Payer: Self-pay

## 2022-03-15 ENCOUNTER — Inpatient Hospital Stay
Admission: RE | Admit: 2022-03-15 | Discharge: 2022-03-15 | Disposition: A | Discharge status: Still In Hospital | Payer: BC Managed Care – PPO | Source: Ambulatory Visit | Attending: RADIATION ONCOLOGY | Admitting: RADIATION ONCOLOGY

## 2022-03-15 ENCOUNTER — Other Ambulatory Visit: Payer: Self-pay

## 2022-03-15 NOTE — Progress Notes (Signed)
Name: Elizabeth [name] MRN:  [id number]   Date: 03/15/2022 Age: 52 y.o.       TUMOR:  Astrocytoma post resection and adjuvant chemoradiation completed 2015    TREATMENT INTERVAL:  9 years    INTERVAL ONCOLOGIC HISTORY:  Elizabeth [name] 52 year old who is now 9 years post treatment for primary astrocytoma.  She reports stable difficulty with word finding.  She has some mild balance issues.  She has no new headache or other new complaints.  MRI was performed January 29th [facility] that showed stable findings.  There is no new contrast enhancing areas.    OTHER MEDICAL PROBLEMS:     There are no exam notes on file for this visit.     PAST MEDICAL HISTORY    Past Medical History:   Diagnosis Date    Anemia     Cancer (CMS [facility])     brain tumor 8/15 with chemo and radiation    Disorder of thyroid     Epileptic seizure (CMS HCC)     Essential hypertension     Hx of breast cancer         ALLERGIES:   Allergies   Allergen Reactions    Ace Inhibitors      Other reaction(s): Angioedema        MEDICATIONS:   Current Outpatient Medications   Medication Instructions    atorvastatin (LIPITOR) 20 mg, Oral, NIGHTLY    clobetasoL (CORMAX) 0.05 % Solution APPLY TO SCALP EVERY DAY TWO WEEKS OUT OF THE MONTH OR LESS    ergocalciferol, vitamin D2, (DRISDOL) 1,250 mcg (50,000 unit) Oral Capsule AS DIRECTED ONCE A WEEK    escitalopram oxalate (LEXAPRO) 10 mg, Oral, NIGHTLY    famotidine (PEPCID) 20 mg, Oral, DAILY    FREESTYLE LANCETS 28 gauge Misc TEST ONCE DAILY    FREESTYLE LITE METER Kit EVERY DAY    FREESTYLE LITE STRIPS Strip TEST EVERY DAY    hydroCHLOROthiazide (HYDRODIURIL) 25 mg, Oral, EVERY MORNING    ketoconazole (NIZORAL) 2 % Shampoo PLEASE SEE ATTACHED FOR DETAILED DIRECTIONS    levothyroxine (SYNTHROID) 25 mcg Oral Tablet TAKE 1 TABLET ONCE A DAY IN THE MORNING 30 MINUTES BEFORE MEALS OR ANY OTHER MEDICATIONS    losartan (COZAAR) 100 mg, Oral, DAILY    magnesium Oxide 420 mg, Oral, DAILY    metFORMIN (GLUCOPHAGE) 500 mg,  Oral, 2 TIMES DAILY    methocarbamoL (ROBAXIN) 500 mg, Oral, 3 TIMES DAILY PRN    Ozempic 0.25 mg, Subcutaneous, EVERY 7 DAYS    phenytoin sodium extended release (DILANTIN) 100 mg Oral Capsule TAKE 3 CAPSULES BY MOUTH TWICE A DAY        INTERVAL SURGERIES:   Past Surgical History:   Procedure Laterality Date    BRAIN SURGERY      HX CHOLECYSTECTOMY          FAMILY HISTORY: Reviewed and unchanged    SOCIAL HISTORY: Reviewed and unchanged    REVIEW OF SYSTEMS  Pertinent review of systems as discussed in interval oncological history      PHYSICAL EXAMINATION  Physical Exam  Eyes:      Extraocular Movements: Extraocular movements intact.      Pupils: Pupils are equal, round, and reactive to light.   Neurological:      Mental Status: She is alert and oriented to person, place, and time. Mental status is at baseline.          RADIOGRAPHS:   MRI reviewed  LABORATORY RESULT:  None  ASSESSMENT & PLAN  52 year old with primary brain tumor 9 years out from chemoradiation in the adjuvant setting.  She is doing well without any evidence of recurrence radiographically or clinically.  We will repeat MRI in 1 year in follow up and see her at that time.  She is instructed to call in the interim should she have problems or concerns.    Adah Salvage, MD  03/15/2022 11:54     KT:[id number]

## 2022-07-04 ENCOUNTER — Ambulatory Visit (HOSPITAL_COMMUNITY): Payer: Self-pay | Admitting: NURSE PRACTITIONER

## 2023-02-18 ENCOUNTER — Other Ambulatory Visit (HOSPITAL_BASED_OUTPATIENT_CLINIC_OR_DEPARTMENT_OTHER): Payer: Self-pay | Admitting: RADIATION ONCOLOGY

## 2023-02-18 DIAGNOSIS — C719 Malignant neoplasm of brain, unspecified: Secondary | ICD-10-CM

## 2023-02-28 ENCOUNTER — Ambulatory Visit: Payer: BC Managed Care – PPO | Attending: RADIATION ONCOLOGY

## 2023-02-28 ENCOUNTER — Other Ambulatory Visit: Payer: Self-pay

## 2023-02-28 DIAGNOSIS — C719 Malignant neoplasm of brain, unspecified: Secondary | ICD-10-CM | POA: Insufficient documentation

## 2023-02-28 LAB — CREATININE WITH EGFR
CREATININE: 0.62 mg/dL (ref 0.60–1.30)
ESTIMATED GFR: 106 mL/min/{1.73_m2} (ref >59)

## 2023-03-09 ENCOUNTER — Ambulatory Visit
Admission: RE | Admit: 2023-03-09 | Discharge: 2023-03-09 | Disposition: A | Discharge status: Home / Self Care | Payer: BC Managed Care – PPO | Source: Ambulatory Visit | Attending: RADIATION ONCOLOGY | Admitting: RADIATION ONCOLOGY

## 2023-03-09 ENCOUNTER — Other Ambulatory Visit: Payer: Self-pay

## 2023-03-09 DIAGNOSIS — C719 Malignant neoplasm of brain, unspecified: Secondary | ICD-10-CM | POA: Insufficient documentation

## 2023-03-09 MED ORDER — GADOBUTROL 10 MMOL/10 ML (1 MMOL/ML) INTRAVENOUS SOLUTION
10.0000 mL | INTRAVENOUS | Status: AC
Start: 2023-03-09 — End: 2023-03-09
  Administered 2023-03-09: 9 mL via INTRAVENOUS

## 2023-03-10 ENCOUNTER — Ambulatory Visit (HOSPITAL_BASED_OUTPATIENT_CLINIC_OR_DEPARTMENT_OTHER): Payer: BC Managed Care – PPO

## 2023-03-21 ENCOUNTER — Ambulatory Visit (HOSPITAL_BASED_OUTPATIENT_CLINIC_OR_DEPARTMENT_OTHER): Payer: Self-pay

## 2023-04-15 ENCOUNTER — Emergency Department (HOSPITAL_BASED_OUTPATIENT_CLINIC_OR_DEPARTMENT_OTHER)

## 2023-04-15 ENCOUNTER — Ambulatory Visit
Admission: RE | Admit: 2023-04-15 | Discharge: 2023-04-15 | Disposition: A | Discharge status: Still In Hospital | Payer: BC Managed Care – PPO | Source: Ambulatory Visit | Attending: RADIATION ONCOLOGY | Admitting: RADIATION ONCOLOGY

## 2023-04-15 ENCOUNTER — Emergency Department: Admission: EM | Admit: 2023-04-15 | Discharge: 2023-04-15 | Disposition: A | Discharge status: Home / Self Care

## 2023-04-15 ENCOUNTER — Other Ambulatory Visit: Payer: Self-pay

## 2023-04-15 ENCOUNTER — Ambulatory Visit: Payer: BC Managed Care – PPO

## 2023-04-15 ENCOUNTER — Telehealth (INDEPENDENT_AMBULATORY_CARE_PROVIDER_SITE_OTHER): Payer: Self-pay | Admitting: UROLOGY

## 2023-04-15 ENCOUNTER — Encounter (HOSPITAL_BASED_OUTPATIENT_CLINIC_OR_DEPARTMENT_OTHER): Payer: Self-pay

## 2023-04-15 DIAGNOSIS — N2889 Other specified disorders of kidney and ureter: Secondary | ICD-10-CM

## 2023-04-15 DIAGNOSIS — N201 Calculus of ureter: Secondary | ICD-10-CM

## 2023-04-15 DIAGNOSIS — N1330 Unspecified hydronephrosis: Secondary | ICD-10-CM

## 2023-04-15 DIAGNOSIS — N132 Hydronephrosis with renal and ureteral calculous obstruction: Secondary | ICD-10-CM

## 2023-04-15 DIAGNOSIS — Z85841 Personal history of malignant neoplasm of brain: Secondary | ICD-10-CM | POA: Insufficient documentation

## 2023-04-15 LAB — CBC WITH DIFF
BASOPHIL #: 0.02 10*3/uL (ref 0.00–0.10)
BASOPHIL %: 0 % (ref 0–1)
EOSINOPHIL #: 0.21 10*3/uL (ref 0.00–0.50)
EOSINOPHIL %: 2 % (ref 1–7)
HCT: 36.1 % (ref 31.2–41.9)
HGB: 12.4 g/dL (ref 10.9–14.3)
LYMPHOCYTE #: 2.93 10*3/uL (ref 1.10–3.10)
LYMPHOCYTE %: 32 % (ref 16–46)
MCH: 33.9 pg — ABNORMAL HIGH (ref 24.7–32.8)
MCHC: 34.4 g/dL (ref 32.3–35.6)
MCV: 98.6 fL — ABNORMAL HIGH (ref 75.5–95.3)
MONOCYTE #: 0.77 10*3/uL (ref 0.20–0.90)
MONOCYTE %: 8 % (ref 4–11)
MPV: 7.2 fL — ABNORMAL LOW (ref 7.9–10.8)
NEUTROPHIL #: 5.19 10*3/uL (ref 1.90–8.20)
NEUTROPHIL %: 57 % (ref 43–77)
PLATELETS: 225 10*3/uL (ref 140–440)
RBC: 3.66 10*6/uL (ref 3.63–4.92)
RDW: 15.3 % (ref 12.3–17.7)
WBC: 9.1 10*3/uL (ref 3.8–11.8)

## 2023-04-15 LAB — LACTIC ACID LEVEL W/ REFLEX FOR LEVEL >2.0: LACTIC ACID: 1.2 mmol/L (ref 0.4–2.0)

## 2023-04-15 LAB — URINALYSIS, MACRO/MICRO
BILIRUBIN: NEGATIVE mg/dL
GLUCOSE: NEGATIVE mg/dL
KETONES: NEGATIVE mg/dL
NITRITE: NEGATIVE
PH: 6 (ref 4.6–8.0)
SPECIFIC GRAVITY: 1.025 (ref 1.003–1.035)
UROBILINOGEN: 0.2 mg/dL (ref 0.2–1.0)

## 2023-04-15 LAB — COMPREHENSIVE METABOLIC PANEL, NON-FASTING
ALBUMIN/GLOBULIN RATIO: 1.2 (ref 0.8–1.4)
ALBUMIN: 3.9 g/dL (ref 3.4–5.0)
ALKALINE PHOSPHATASE: 146 U/L — ABNORMAL HIGH (ref 46–116)
ALT (SGPT): 47 U/L (ref <=78)
ANION GAP: 9 mmol/L (ref 4–13)
AST (SGOT): 21 U/L (ref 15–37)
BILIRUBIN TOTAL: 0.4 mg/dL (ref 0.2–1.0)
BUN/CREA RATIO: 16
BUN: 11 mg/dL (ref 7–18)
CALCIUM, CORRECTED: 9.3 mg/dL
CALCIUM: 9.2 mg/dL (ref 8.5–10.1)
CHLORIDE: 105 mmol/L (ref 98–107)
CO2 TOTAL: 30 mmol/L (ref 21–32)
CREATININE: 0.69 mg/dL (ref 0.55–1.02)
ESTIMATED GFR: 103 mL/min/{1.73_m2} (ref >59)
GLOBULIN: 3.2
GLUCOSE: 104 mg/dL (ref 74–106)
OSMOLALITY, CALCULATED: 287 mosm/kg (ref 270–290)
POTASSIUM: 3.4 mmol/L — ABNORMAL LOW (ref 3.5–5.1)
PROTEIN TOTAL: 7.1 g/dL (ref 6.4–8.2)
SODIUM: 144 mmol/L (ref 136–145)

## 2023-04-15 LAB — URINALYSIS, MICROSCOPIC

## 2023-04-15 MED ORDER — TAMSULOSIN 0.4 MG CAPSULE
0.4000 mg | ORAL_CAPSULE | ORAL | Status: AC
Start: 2023-04-15 — End: 2023-04-15
  Administered 2023-04-15: 0.4 mg via ORAL

## 2023-04-15 MED ORDER — KETOROLAC 30 MG/ML (1 ML) INJECTION SOLUTION
30.0000 mg | INTRAMUSCULAR | Status: AC
Start: 2023-04-15 — End: 2023-04-15
  Administered 2023-04-15: 30 mg via INTRAMUSCULAR

## 2023-04-15 MED ORDER — LIDOCAINE HCL 10 MG/ML (1 %) INJECTION SOLUTION
1.0000 g | Freq: Once | INTRAMUSCULAR | Status: AC
Start: 2023-04-15 — End: 2023-04-15
  Administered 2023-04-15: 1 g via INTRAMUSCULAR

## 2023-04-15 MED ORDER — CEFDINIR 300 MG CAPSULE
300.0000 mg | ORAL_CAPSULE | Freq: Two times a day (BID) | ORAL | 0 refills | Status: AC
Start: 2023-04-15 — End: 2023-04-22

## 2023-04-15 MED ORDER — KETOROLAC 30 MG/ML (1 ML) INJECTION SOLUTION
INTRAMUSCULAR | Status: AC
Start: 2023-04-15 — End: 2023-04-15
  Filled 2023-04-15: qty 1

## 2023-04-15 MED ORDER — KETOROLAC 30 MG/ML (1 ML) INJECTION SOLUTION
30.0000 mg | INTRAMUSCULAR | Status: DC
Start: 2023-04-15 — End: 2023-04-15

## 2023-04-15 MED ORDER — TAMSULOSIN 0.4 MG CAPSULE
ORAL_CAPSULE | ORAL | Status: AC
Start: 2023-04-15 — End: 2023-04-15
  Filled 2023-04-15: qty 1

## 2023-04-15 MED ORDER — TAMSULOSIN 0.4 MG CAPSULE
0.4000 mg | ORAL_CAPSULE | Freq: Every evening | ORAL | 0 refills | Status: DC
Start: 2023-04-15 — End: 2023-07-16

## 2023-04-15 MED ORDER — CEFTRIAXONE 1 GRAM SOLUTION FOR INJECTION
INTRAMUSCULAR | Status: AC
Start: 2023-04-15 — End: 2023-04-15
  Filled 2023-04-15: qty 10

## 2023-04-15 MED ORDER — HYDROCODONE 5 MG-ACETAMINOPHEN 325 MG TABLET
1.0000 | ORAL_TABLET | Freq: Four times a day (QID) | ORAL | 0 refills | Status: DC | PRN
Start: 2023-04-15 — End: 2023-07-16

## 2023-04-15 MED ORDER — ONDANSETRON 4 MG DISINTEGRATING TABLET
4.0000 mg | ORAL_TABLET | Freq: Three times a day (TID) | ORAL | 0 refills | Status: DC | PRN
Start: 2023-04-15 — End: 2023-07-16

## 2023-04-15 NOTE — Telephone Encounter (Signed)
 Patient presented to the emergency room with flank pain he has a history of astrocytoma treated by Oncology but a incidental finding she has a large left renal mass.  Can we please make arrangements in the next week or 2 to see this patient and have her referred to the appropriate facility    Patient also has a right 3 mm distal stone she is placed on medical expulsive therapy    Thank you    Sharlene Motts

## 2023-04-15 NOTE — ED Provider Notes (Signed)
 [facility]  ED Primary Provider Note      Name: Elizabeth [name]  Age and Gender: 53 y.o. female  Date of Birth: 1970/05/03  MRN: [id number]  PCP: [facility]    CC:  Chief Complaint   Patient presents with    Flank Pain     Rt flank pain couple day, says urine is coming out as a trickle        HPI:  Elizabeth [name] is a 53 y.o. White female who presents to the ER with right flank pain.  Patient states that she has had flank pain that radiates down for the last couple of days has taken Tylenol and Motrin for this without relief of pain. Reports that she is having difficulty urinating, reports it is just a trickle.  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 Has seen Dr. Joslyn Devon, follows with him yearly, he sent her here to be evaluated. Denies any fever, chills, nausea vomiting or diarrhea.    Below pertinent information reviewed with patient:  Past Medical History:   Diagnosis Date    Anemia     Cancer (CMS HCC)     brain tumor 8/15 with chemo and radiation    Disorder of thyroid     Epileptic seizure (CMS HCC)     Essential hypertension     Hx of breast cancer        Allergies   Allergen Reactions    Ace Inhibitors      Other reaction(s): Angioedema       Past Surgical History:   Procedure Laterality Date    BRAIN SURGERY      HX CHOLECYSTECTOMY          Social History     Socioeconomic History    Marital status: Divorced   Tobacco Use    Smoking status: Never    Smokeless tobacco: Never   Vaping Use    Vaping status: Never Used   Substance and Sexual Activity    Alcohol use: Never    Drug use: Never       ROS:  No other overt positive review of systems are noted other than stated in the HPI.      Objective:    ED Triage Vitals [04/15/23 1400]   BP (Non-Invasive) (!) 163/75   Heart Rate 78   Respiratory Rate 17   Temperature 36.4 C (97.6 F)   SpO2 100 %   Weight 93 kg (205 lb)   Height 1.575 m (5\' 2" )     Filed Vitals:    04/15/23  1400   BP: (!) 163/75   Pulse: 78   Resp: 17   Temp: 36.4 C (97.6 F)   SpO2: 100%       Nursing notes and vital signs reviewed.    Constitutional - No acute distress.  Alert and Active.  HEENT - Normocephalic. Atraumatic. PERRL. EOMI. Conjunctiva clear.  Moist mucous membranes.   Neck - Trachea midline. No stridor. No hoarseness.  Cardiac - Regular rate and rhythm. No murmurs, rubs, or gallops. Intact distal pulses.  Respiratory/Chest - Normal respiratory effort. Clear to auscultation bilaterally. No rales, wheezes or rhonchi. No chest tenderness.  Abdomen - Normal bowel sounds. Non-tender, soft, non-distended. Right CVA tenderness  Musculoskeletal - Good AROM. No muscle or joint tenderness appreciated. No clubbing, cyanosis or edema.  Skin - Warm and dry, without any rashes or other lesions.  Neuro -  Alert and oriented x 3. Cranial nerves II-XII are grossly intact.  Moving all extremities symmetrically. Normal gait.  Psych - Normal mood and affect. Behavior is normal        Any pertinent labs and imaging obtained during this encounter reviewed below in MDM.    MDM/ED Course:    Medical Decision Making  Patient presents to the ER with right flank pain.  Patient states that she has had flank pain that radiates down for the last couple of days has taken Tylenol and Motrin for this without relief of pain. Reports that she is having difficulty urinating, reports it is just a trickle.  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 Has seen Dr. Joslyn Devon, follows with him yearly, he sent her here to be evaluated. Denies any fever, chills, nausea vomiting or diarrhea.    Differential diagnoses include:  Urinary tract infection, ureteral calculus, hydronephrosis, pyelonephritis    Patient underwent diagnostics with results as noted in ED course.    Patient was found to have right distal ureter calculus measuring 3.2 mm with mild hydronephrosis.  CBC was unremarkable.  CMP was  unremarkable.  Lactic acid was within normal limits.  CT scan did show the obstructing distal right ureter calculus.  CT also showed a 14 cm left renal mass which should be considered renal cell carcinoma until proven otherwise.  I spoke with on-call Urology Dr. Sharlene Motts, who states he will call the clinic and schedule a close follow up with this patient.  I discussed the findings with the patient, she states that she has had concern for quite some time that there was something there.  Dr. Daphine Deutscher office also called regarding his results and I will let them know that she has a urologic close follow up.  Regarding the kidney stone, patient will be discharged with medical expulsive therapy including Flomax, Norco, Zofran and cefdinir.  PDMP Reviewed. Patient received Toradol 30 mg intramuscularly, Rocephin 1 g intramuscularly, and Flomax in the emergency department.  We are covering for bacteria in her urine.  Encouraged patient to follow up with Urology office if she has not heard from them in the next couple of days.  I did place an inpatient referral for care.  Strict ER return precautions given.  Patient agreeable to treatment course.  All questions answered.    Patient will be discharged in stable condition at this time.    Amount and/or Complexity of Data Reviewed  Labs: ordered. Decision-making details documented in ED Course.  Radiology: ordered. Decision-making details documented in ED Course.  ECG/medicine tests: independent interpretation performed.    Risk  OTC drugs.  Prescription drug management.          ED Course as of 04/15/23 2053   Tue Apr 15, 2023   1433 CBC/DIFF(!)  WBCs 9.1, hemoglobin 12.4, hematocrit 36.1   1509 COMPREHENSIVE METABOLIC PANEL, NON-FASTING(!)  Na 144, K 3.4, BUN 11, creatinine 0.69   1509 LACTIC ACID: 1.2   1527 CT RENAL CALCULI SERIES WO IV CONTRAST  1.A 3.2 MM OBSTRUCTING DISTAL RIGHT URETERAL CALCULUS WITH MILD RIGHT HYDRONEPHROSIS.  2.A 14 CM EXOPHYTIC LEFT RENAL MASS SHOULD BE  CONSIDERED RENAL CELL CARCINOMA UNTIL PROVEN OTHERWISE. A 15 MM SOFT TISSUE NODULE NEAR THE LATERAL MARGIN OF THE MASS MAY REPRESENT A SPLENULE ALTHOUGH A SATELLITE NODULE CAN NOT BE EXCLUDED.  3.AN 8 MM LEFT PARAAORTIC LYMPH NODE IS INDETERMINATE.  4.A 9 MM HYPERDENSE RIGHT RENAL LESION  IS INCOMPLETELY CHARACTERIZED.     1527 Spoke with on-call urology, Dr. Sharlene Motts, who states he will call the office and have this patient seen within the next week. Also will place referral.    1535 URINALYSIS WITH REFLEX MICROSCOPIC AND CULTURE IF POSITIVE(!)  Trace leukocytes, 20-50 WBCs with few bacteria,       Orders Placed This Encounter    URINE CULTURE,ROUTINE    CT RENAL CALCULI SERIES WO IV CONTRAST    URINALYSIS WITH REFLEX MICROSCOPIC AND CULTURE IF POSITIVE    CBC/DIFF    COMPREHENSIVE METABOLIC PANEL, NON-FASTING    LACTIC ACID LEVEL W/ REFLEX FOR LEVEL >2.0    URINALYSIS, MACRO/MICRO    CBC WITH DIFF    URINALYSIS, MICROSCOPIC    Referral to UROLOGY - [place] - CUTRONE    ketorolac (TORADOL) 30 mg/mL injection    tamsulosin (FLOMAX) capsule    cefTRIAXone (ROCEPHIN) 1 g in lidocaine 2.86 mL (tot vol) IM injection    tamsulosin (FLOMAX) 0.4 mg Oral Capsule    cefdinir (OMNICEF) 300 mg Oral Capsule    HYDROcodone-acetaminophen (NORCO) 5-325 mg Oral Tablet    ondansetron (ZOFRAN ODT) 4 mg Oral Tablet, Rapid Dissolve     Impression:   Clinical Impression   Calculus of distal right ureter (Primary)   Hydronephrosis, unspecified hydronephrosis type   Left renal mass       Disposition: Discharged    / M. Toney Sang, APRN, FNP-C 04/15/2023, 14:08  [facility]  Department of Emergency Medicine  [employer]    Portions of this note may have been dictated using voice recognition software.     -----------------------  Results for orders placed or performed during the hospital encounter of 04/15/23 (from the past 12 hours)   COMPREHENSIVE METABOLIC PANEL, NON-FASTING   Result Value Ref Range    SODIUM  144 136 - 145 mmol/L    POTASSIUM 3.4 (L) 3.5 - 5.1 mmol/L    CHLORIDE 105 98 - 107 mmol/L    CO2 TOTAL 30 21 - 32 mmol/L    ANION GAP 9 4 - 13 mmol/L    BUN 11 7 - 18 mg/dL    CREATININE 5.40 9.81 - 1.02 mg/dL    BUN/CREA RATIO 16     ESTIMATED GFR 103 >59 mL/min/1.87m^2    ALBUMIN 3.9 3.4 - 5.0 g/dL    CALCIUM 9.2 8.5 - 19.1 mg/dL    GLUCOSE 478 74 - 295 mg/dL    ALKALINE PHOSPHATASE 146 (H) 46 - 116 U/L    ALT (SGPT) 47 <=78 U/L    AST (SGOT) 21 15 - 37 U/L    BILIRUBIN TOTAL 0.4 0.2 - 1.0 mg/dL    PROTEIN TOTAL 7.1 6.4 - 8.2 g/dL    ALBUMIN/GLOBULIN RATIO 1.2 0.8 - 1.4    OSMOLALITY, CALCULATED 287 270 - 290 mOsm/kg    CALCIUM, CORRECTED 9.3 mg/dL    GLOBULIN 3.2    LACTIC ACID LEVEL W/ REFLEX FOR LEVEL >2.0   Result Value Ref Range    LACTIC ACID 1.2 0.4 - 2.0 mmol/L   CBC WITH DIFF   Result Value Ref Range    WBC 9.1 3.8 - 11.8 x10^3/uL    RBC 3.66 3.63 - 4.92 x10^6/uL    HGB 12.4 10.9 - 14.3 g/dL    HCT 62.1 30.8 - 65.7 %    MCV 98.6 (H) 75.5 - 95.3 fL    MCH 33.9 (H) 24.7 - 32.8 pg  MCHC 34.4 32.3 - 35.6 g/dL    RDW 16.1 09.6 - 04.5 %    PLATELETS 225 140 - 440 x10^3/uL    MPV 7.2 (L) 7.9 - 10.8 fL    NEUTROPHIL % 57 43 - 77 %    LYMPHOCYTE % 32 16 - 46 %    MONOCYTE % 8 4 - 11 %    EOSINOPHIL % 2 1 - 7 %    BASOPHIL % 0 0 - 1 %    NEUTROPHIL # 5.19 1.90 - 8.20 x10^3/uL    LYMPHOCYTE # 2.93 1.10 - 3.10 x10^3/uL    MONOCYTE # 0.77 0.20 - 0.90 x10^3/uL    EOSINOPHIL # 0.21 0.00 - 0.50 x10^3/uL    BASOPHIL # 0.02 0.00 - 0.10 x10^3/uL   URINALYSIS, MACRO/MICRO   Result Value Ref Range    COLOR Light Yellow Light Yellow, Yellow    APPEARANCE Slightly Hazy (A) Clear    SPECIFIC GRAVITY 1.025 1.003 - 1.035    PH 6.0 4.6 - 8.0    LEUKOCYTES Trace (A) Negative WBCs/uL    NITRITE Negative Negative    PROTEIN Trace (A) Negative mg/dL    GLUCOSE Negative Negative mg/dL    KETONES Negative Negative mg/dL    BILIRUBIN Negative Negative mg/dL    BLOOD Large (A) Negative mg/dL    UROBILINOGEN 0.2 0.2 - 1.0 mg/dL    URINALYSIS, MICROSCOPIC   Result Value Ref Range    RBCS TNTC (A) 0-3, Not Present /hpf    BACTERIA Few (A) Negative /hpf    WBCS 20-50 (A) Not Present, Occasional, 0-5 /hpf    SQUAMOUS EPITHELIAL Few Not Present, Few /hpf     CT RENAL CALCULI SERIES WO IV CONTRAST   Final Result   1. A 3.2 MM OBSTRUCTING DISTAL RIGHT URETERAL CALCULUS WITH MILD RIGHT HYDRONEPHROSIS.   2. A 14 CM EXOPHYTIC LEFT RENAL MASS SHOULD BE CONSIDERED RENAL CELL CARCINOMA UNTIL PROVEN OTHERWISE. A 15 MM SOFT TISSUE NODULE NEAR THE LATERAL MARGIN OF THE MASS MAY REPRESENT A SPLENULE ALTHOUGH A SATELLITE NODULE CAN NOT BE EXCLUDED.   3. AN 8 MM LEFT PARAAORTIC LYMPH NODE IS INDETERMINATE.   4. A 9 MM HYPERDENSE RIGHT RENAL LESION IS INCOMPLETELY CHARACTERIZED.             One or more dose reduction techniques were used (e.g., Automated exposure control, adjustment of the mA and/or kV according to patient size, use of iterative reconstruction technique).         Radiologist location ID: [id number]

## 2023-04-15 NOTE — ED Nurses Note (Signed)
 Patient discharged home with family.  AVS reviewed with patient/care giver.  A written copy of the AVS and discharge instructions was given to the patient/care giver. Prescriptions escribed to preferred pharmacy. Questions sufficiently answered as needed.  Patient/care giver encouraged to follow up with PCP as indicated.  In the event of an emergency, patient/care giver instructed to call 911 or go to the nearest emergency room.

## 2023-04-15 NOTE — Discharge Instructions (Signed)
 Thank you for allowing [place] to be part of your care.    Please call Dr. Sharlene Motts with the number above if you have not heard from him in the next 2-3 days to schedule an appointment for CT scan findings.    Please discuss all medications with your pharmacist to ensure there are no concerns of interactions.    Please ensure all questions or concerns are addressed prior to leaving the hospital. We want to make sure your concerns are addressed to make sure you are as safe and healthy as possible. By leaving the hospital, it is understood you are in agreement with your treatment plan.    You may have received sedating medication during your visit. Please discuss this with your discharging provider nurse as you may not be able to operate machines while the medication is in your system, or while you are taking any potentially sedating prescriptions.    Please call the hospital medical records office for a copy of your finalized results, and review them with a primary care physician, for any findings needing further attention.    If you feel your situation worsens, or does not get better in 48 hours, please see a physician for evaluation.    We encourage you to see your regular doctor as soon as possible to let them know you were seen in the emergency department. They may want to do further testing. If you do not have a doctor, please feel free to call the hospital, and ask for contact information of accepting providers. Please also discuss your vaccinations, and ensure all are up to date.    You may use this document to take today off work or school.

## 2023-04-18 LAB — URINE CULTURE,ROUTINE: URINE CULTURE: NO GROWTH

## 2023-04-22 ENCOUNTER — Ambulatory Visit (INDEPENDENT_AMBULATORY_CARE_PROVIDER_SITE_OTHER): Payer: Self-pay | Admitting: UROLOGY

## 2023-04-25 ENCOUNTER — Ambulatory Visit
Admission: RE | Admit: 2023-04-25 | Discharge: 2023-04-25 | Disposition: A | Discharge status: Still In Hospital | Source: Ambulatory Visit | Attending: RADIATION ONCOLOGY | Admitting: RADIATION ONCOLOGY

## 2023-04-25 ENCOUNTER — Other Ambulatory Visit: Payer: Self-pay

## 2023-04-25 DIAGNOSIS — Z08 Encounter for follow-up examination after completed treatment for malignant neoplasm: Secondary | ICD-10-CM | POA: Insufficient documentation

## 2023-04-25 DIAGNOSIS — Z85841 Personal history of malignant neoplasm of brain: Secondary | ICD-10-CM | POA: Insufficient documentation

## 2023-04-25 DIAGNOSIS — Z923 Personal history of irradiation: Secondary | ICD-10-CM | POA: Insufficient documentation

## 2023-04-25 DIAGNOSIS — N2889 Other specified disorders of kidney and ureter: Secondary | ICD-10-CM | POA: Insufficient documentation

## 2023-04-25 DIAGNOSIS — Z9221 Personal history of antineoplastic chemotherapy: Secondary | ICD-10-CM | POA: Insufficient documentation

## 2023-04-25 NOTE — Progress Notes (Signed)
 RADIATION ONCOLOGY FOLLOW-UP NOTE      Patient Name: Elizabeth [name]  Med Record #: [id number]  Date of Birth:  July 29, 1970      SUMMARY     Diagnosis/Stage:  Cancer Staging   Astrocytoma post resection and adjuvant chemoradiation completed 2015      Assessment:  53 year old with a history of primary brain tumor with no evidence of recurrence on MRI.  She was found to have a large left renal mass on recent imaging.    Oncology disease status: The patient has no clinical or biochemical evidence of cancer at this visit    Recommendations:  I have recommended continued follow up with repeat MRI in 1 year of the brain.  We will order a CT-guided biopsy of the left renal mass and follow up afterwards to coordinate care if this results in diagnosis of a kidney cancer.    The indications, time course, benefits, risks and side effects of radiation treatment were explained to the patient, and her questions were answered to her apparent satisfaction. I encouraged her to contact us  at any time should she have any further questions or concerns. I personally saw and examined the patient, and reviewed all prior imaging and pathologic findings with her. I spent greater than 50% of a 30 minute visit in discussion of the patient's diagnosis and management.    FULL NOTE     Interval History :   Elizabeth [name] is a 53 y.o. female with a history of primary brain tumor.  She has a distant history of resection and adjuvant radiotherapy.  She has had stable difficulty with word finding and cloudy thinking.  Recent MRI showed no evidence of recurrence.  She was however seen in the emergency room with right flank pain and found to have a right kidney stone.  The pain has improved with passing of the kidney stone but imaging revealed a very large left renal mass.    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     Disorder of thyroid     Epileptic seizure (CMS HCC)     Essential hypertension     Hx of breast cancer          Past Surgical History:   Procedure Laterality Date    BRAIN SURGERY      HX CHOLECYSTECTOMY             Family History:   Family Medical History:       Problem Relation (Age of Onset)    Arthritis-osteo Mother    Breast Cancer Paternal Grandmother    Heart Disease Father    Hypertension (High Blood Pressure) Mother    No Known Problems Sister, Brother, Maternal Grandmother, Maternal Grandfather, Paternal Grandfather, Daughter, Son, Maternal Aunt, Maternal Uncle, Paternal Aunt, Paternal Uncle, Other              Social History:   Social History     Socioeconomic History    Marital status: Divorced     Spouse name: Not on file    Number of children: Not on file    Years of education: Not on file    Highest education level: Not on file   Occupational History    Not on file   Tobacco Use    Smoking status:  Never    Smokeless tobacco: Never   Vaping Use    Vaping status: Never Used   Substance and Sexual Activity    Alcohol use: Never    Drug use: Never    Sexual activity: Not on file   Other Topics Concern    Not on file   Social History Narrative    Not on file     Social Determinants of Health     Financial Resource Strain: Not on file   Transportation Needs: Not on file   Social Connections: Not on file   Intimate Partner Violence: Not on file   Housing Stability: Not on file       ALLERGIES:   Allergies   Allergen Reactions    Ace Inhibitors      Other reaction(s): Angioedema        MEDICATIONS:   Current Outpatient Medications   Medication Instructions    atorvastatin  (LIPITOR) 20 mg, NIGHTLY    clobetasoL (CORMAX) 0.05 % Solution APPLY TO SCALP EVERY DAY TWO WEEKS OUT OF THE MONTH OR LESS    ergocalciferol, vitamin D2, (DRISDOL) 1,250 mcg (50,000 unit) Oral Capsule AS DIRECTED ONCE A WEEK    escitalopram  oxalate (LEXAPRO ) 10 mg, NIGHTLY    famotidine  (PEPCID ) 20 mg, Daily    FREESTYLE LANCETS 28 gauge  Misc TEST ONCE DAILY    FREESTYLE LITE METER Kit EVERY DAY    FREESTYLE LITE STRIPS Strip TEST EVERY DAY    hydroCHLOROthiazide  (HYDRODIURIL ) 25 mg, EVERY MORNING    HYDROcodone -acetaminophen  (NORCO) 5-325 mg Oral Tablet 1 Tablet, Oral, EVERY 6 HOURS PRN    ketoconazole (NIZORAL) 2 % Shampoo PLEASE SEE ATTACHED FOR DETAILED DIRECTIONS    levothyroxine  (SYNTHROID ) 25 mcg Oral Tablet TAKE 1 TABLET ONCE A DAY IN THE MORNING 30 MINUTES BEFORE MEALS OR ANY OTHER MEDICATIONS    losartan  (COZAAR ) 100 mg, Daily    magnesium  Oxide 420 mg, Oral, Daily    metFORMIN (GLUCOPHAGE) 500 mg, Oral, 2 TIMES DAILY    methocarbamoL  (ROBAXIN ) 500 mg, Oral, 3 TIMES DAILY PRN    ondansetron  (ZOFRAN  ODT) 4 mg, Oral, EVERY 8 HOURS PRN    Ozempic 0.25 mg, EVERY 7 DAYS    phenytoin  sodium extended release (DILANTIN ) 100 mg Oral Capsule TAKE 3 CAPSULES BY MOUTH TWICE A DAY    tamsulosin  (FLOMAX ) 0.4 mg, Oral, EVERY EVENING AFTER DINNER        REVIEW OF SYSTEMS  Pertinent review of systems as discussed in Interval History.      Objective:   There were no vitals filed for this visit.          PHYSICAL EXAMINATION  Physical Exam  Constitutional:       Appearance: Normal appearance.   Eyes:      Extraocular Movements: Extraocular movements intact.      Pupils: Pupils are equal, round, and reactive to light.   Pulmonary:      Effort: Pulmonary effort is normal.   Neurological:      Mental Status: She is alert.      Comments: Patient is oriented x3 but has some difficulty with word finding.   Psychiatric:         Mood and Affect: Mood normal.         Behavior: Behavior normal.          LABS/IMAGING: All relevant labs and imaging were reviewed as per HPI.      Olita Best, MD 04/25/2023, 14:14  cc:[email or web address]@

## 2023-04-28 ENCOUNTER — Other Ambulatory Visit (HOSPITAL_BASED_OUTPATIENT_CLINIC_OR_DEPARTMENT_OTHER): Payer: Self-pay | Admitting: RADIATION ONCOLOGY

## 2023-04-28 DIAGNOSIS — N2889 Other specified disorders of kidney and ureter: Secondary | ICD-10-CM

## 2023-05-12 ENCOUNTER — Other Ambulatory Visit: Payer: Self-pay

## 2023-05-12 ENCOUNTER — Ambulatory Visit
Admission: RE | Admit: 2023-05-12 | Discharge: 2023-05-12 | Disposition: A | Discharge status: Home / Self Care | Payer: Self-pay | Source: Ambulatory Visit

## 2023-05-13 ENCOUNTER — Other Ambulatory Visit (HOSPITAL_BASED_OUTPATIENT_CLINIC_OR_DEPARTMENT_OTHER): Payer: Self-pay | Admitting: Urology

## 2023-05-13 DIAGNOSIS — N2889 Other specified disorders of kidney and ureter: Secondary | ICD-10-CM

## 2023-05-14 ENCOUNTER — Encounter (HOSPITAL_COMMUNITY): Payer: Self-pay

## 2023-05-19 ENCOUNTER — Ambulatory Visit (HOSPITAL_COMMUNITY): Payer: Self-pay

## 2023-05-27 ENCOUNTER — Ambulatory Visit
Admission: RE | Admit: 2023-05-27 | Discharge: 2023-05-27 | Disposition: A | Discharge status: Home / Self Care | Source: Ambulatory Visit | Attending: Urology | Admitting: Urology

## 2023-05-27 ENCOUNTER — Other Ambulatory Visit: Payer: Self-pay

## 2023-05-27 ENCOUNTER — Ambulatory Visit (HOSPITAL_BASED_OUTPATIENT_CLINIC_OR_DEPARTMENT_OTHER)
Admission: RE | Admit: 2023-05-27 | Discharge: 2023-05-27 | Disposition: A | Discharge status: Home / Self Care | Payer: Self-pay | Source: Ambulatory Visit

## 2023-05-27 DIAGNOSIS — N2889 Other specified disorders of kidney and ureter: Secondary | ICD-10-CM | POA: Insufficient documentation

## 2023-05-27 LAB — POC ISTAT CREATININE (RESULT)
CREATININE, POC: 0.6 mg/dL (ref 0.60–1.05)
ESTIMATED GFR FEMALE: >60 mL/min/{1.73_m2} (ref >=60)

## 2023-05-27 MED ORDER — IOPAMIDOL 370 MG IODINE/ML (76 %) INTRAVENOUS SOLUTION
100.0000 mL | INTRAVENOUS | Status: AC
Start: 2023-05-27 — End: 2023-05-27
  Administered 2023-05-27: 100 mL via INTRAVENOUS

## 2023-05-28 ENCOUNTER — Encounter (HOSPITAL_BASED_OUTPATIENT_CLINIC_OR_DEPARTMENT_OTHER): Payer: Self-pay | Admitting: Urology

## 2023-05-28 ENCOUNTER — Ambulatory Visit: Payer: Self-pay | Attending: Urology | Admitting: Urology

## 2023-05-28 VITALS — BP 139/58 | HR 84 | Temp 98.4°F | Resp 18 | Ht 62.0 in | Wt 198.9 lb

## 2023-05-28 DIAGNOSIS — N2889 Other specified disorders of kidney and ureter: Secondary | ICD-10-CM | POA: Insufficient documentation

## 2023-05-28 DIAGNOSIS — R918 Other nonspecific abnormal finding of lung field: Secondary | ICD-10-CM

## 2023-05-28 NOTE — Progress Notes (Signed)
 UROLOGIC ONCOLOGY SURGERY NOTE    CHIEF COMPLAINT:   New patient visit for large left renal mass    SUBJECTIVE:  53 y.o. female presents today for evaluation of large left renal mass. She recently presented to outside facility with right flank pain found to have a distal ureteral stone. Large left renal mass seen on CT and referred for evaluation. 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 No other apparent disease on our review. She denies gross hematuria. She does not have abdominal pain. Surgical history positive for lap cholecystectomy. She has HTN, Diabetes and is on Ozempic. She has a history of a brain tumor that was treated in 2015 with some chronic mental deficits but no acute changes.    OBJECTIVE:  BP (!) 139/58   Pulse 84   Temp 36.9 C (98.4 F)   Resp 18   Ht 1.575 m (5\' 2" )   Wt 90.2 kg (198 lb 14.4 oz)   SpO2 95%   BMI 36.38 kg/m       General: Patient is vitally stable (see values). Appears calm and at rest. Dressed appropriately.    Skin: Warm and dry.     Eyes: Eyes are clear.    Pulmonary: Respiratory effort is unlabored.     Psychiatric: Patient is alert, appropriate mood, and in no acute distress.     Musculoskeletal: Patient ambulates without assistance   Cardiovascular: Palpable peripheral pulses.    Neurologic: Neurological exam is consistent with patient's age.      Gastrointestinal: The abdomen is soft, nontender and non-distended.   Well-healed trocar incisions without evidence of incisional hernia.    Genitourinary: No costovertebral angle tenderness bilaterally.  No suprapubic fullness or tenderness.     ASSESSMENT:  53 y.o. female with:    1. 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 Past medical history significant for  diabetes, HTN, astrocytoma  3. Past surgical history significant for lap cholecystectomy  4. Blood thinners: None  5. ECOG: 1 - Ambulatory, restricted in strenuous activity, able to carry out work of a light or sedentary nature     PLAN:  Tentative plan for open left radical nephrectomy for treatment of large left renal mass.  Will pre-admit patient prior and obtain MRI to further characterize involvement of IVC.  She will need Ozempic held a week prior. She is not on blood thinners.    Lynnea Satchel, MD 09:11 05/28/2023  PGY-4 Resident  Urology  [facility] [employer]    I saw and examined the patient.  I reviewed the resident's note.   I agree with the findings and plan of care as documented in the resident's note.   Any exceptions/additions are edited/noted.    I had a detailed discussion with the patient regarding her imaging findings and plan of care.  We discussed that it appears that she has a large left renal mass with invasion of the renal vein at least up to the level of the junction with the inferior vena cava.  At the time of this visit, the final report from her CT chest is pending; however, appreciate a few small nodules concerning for possible metastatic disease.  The patient is currently asymptomatic from the renal mass.  We  discussed management options including neoadjuvant systemic therapy followed by consolidative surgery versus upfront surgery followed by systemic therapy.  The patient understands the risks and benefits of each approach.  We will tentatively plan for surgery and review this patient's case at [facility] Genitourinary Tumor Board.    On the day of the encounter, a total of N5 - 60 minutes was spent on this patient encounter including review of historical information, examination, documentation and post-visit activities. The time documented excludes procedural time.    Mina Alter, MD  Assistant Professor, Surgeon   Division of Urologic Oncology  Department of  Urology   [place]  [facility]

## 2023-06-02 DIAGNOSIS — N2889 Other specified disorders of kidney and ureter: Secondary | ICD-10-CM

## 2023-06-04 ENCOUNTER — Inpatient Hospital Stay (HOSPITAL_COMMUNITY)
Admission: RE | Admit: 2023-06-04 | Discharge: 2023-06-04 | Disposition: A | Discharge status: Home / Self Care | Source: Ambulatory Visit

## 2023-06-04 ENCOUNTER — Encounter (HOSPITAL_COMMUNITY): Payer: Self-pay

## 2023-06-04 ENCOUNTER — Other Ambulatory Visit (HOSPITAL_BASED_OUTPATIENT_CLINIC_OR_DEPARTMENT_OTHER): Payer: Self-pay | Admitting: Urology

## 2023-06-04 HISTORY — DX: Sleep apnea, unspecified: G47.30

## 2023-06-04 HISTORY — DX: Dependence on other enabling machines and devices: Z99.89

## 2023-06-04 HISTORY — DX: Cerebral infarction, unspecified: I63.9

## 2023-06-04 NOTE — Progress Notes (Signed)
 MULTIDISCIPLINARY GENITOURINARY TUMOR BOARD DISCUSSION:    PATIENTAlilah [name] NUMBER:   [id number]  DOB:    20-Jun-1970  AGE:   53 y.o.  DATE:   06/04/2023    REFERRING PROVIDER: No ref. provider found  PCP: [facility]    PRESENTER: Mina Alter, MD  TYPE OF PRESENTATION: Prospective  PATHOLOGY REVIEWED AT [facility]?: No  RADIOGRAPHS REVIEWED AT [facility]?: Yes  NATIONAL GUIDELINES DISCUSSED?: Yes; NCCN    DIAGNOSIS: Left renal mass  DIAGNOSIS LOCATION: Outside Facility  DIAGNOSIS METHOD: Imaging  RECURRENT?: No    FAMILY HISTORY?: Not Significant; Paternal grandmother w/ breast cancer  GENETIC TESTING?: No  PROGNOSTIC INDICATORS DISCUSSED: Yes    CLINICAL TRIAL PARTICIPATION: No  ELIGIBLE CLINICAL TRIALS: No    NEED FOR PALLIATIVE CARE?:  No  PSYCHOSOCIAL CONCERNS?:  No  REHABILITATION CONCERNS?:  No  NUTRITIONAL CONCERNS?:  No    PATIENT NAVIGATOR DISCUSSED OUTREACH ACTIVITIES/PROGRAMS?:  No  PATIENT NAVIGATOR PROVIDED EDUCATIONAL MATERIAL TO PATIENT?:  No    THE PATIENT'S MOST RECENT CLINICAL INFORMATION, IMAGING, AND PATHOLOGY WAS DISCUSSED TODAY AT [facility] MULTIDISCIPLINARY GENITOURINARY TUMOR BOARD BY THE MEDICAL ONCOLOGY, RADIATION ONCOLOGY, SURGERY, RADIOLOGY, PATHOLOGY, PHARMACY, SOCIAL SERVICES, GENETIC, AND NURSING SERVICES.    PRELIMINARY RECOMMENDATIONS BASED ON TODAY'S TUMOR BOARD DISCUSSION:   -Pulmonary nodules concerning for metastatic disease, per radiology  -Obtain PET/CT, if pulmonary nodules are avid, the consensus is to start systemic therapy and delay surgery    Elizabeth [name] 06/04/2023 16:06  Oncology Tumor Board Coordinator

## 2023-06-04 NOTE — Nurses Notes (Signed)
 [facility] MEDICINE    Preoperative Evaluation Center   Department of Anesthesiology      Name: Elizabeth [name], Elizabeth [name]   DOB: [birth year]    PRE-PROCEDURE/OPERATIVE INSTRUCTIONS   Preoperative Evaluation Center [facility])  [address] [place], [place] [address]    Thank you for choosing [facility] for your health care needs. [facility] is a tobacco-free campus. Please refrain from using any forms of tobacco while on the premises.     Please review upon receipt and again prior to procedure date.    If, after your PEC call or visit, you experience any of the changes below or develop any of the listed symptoms, Call the Preoperative Evaluation Center [facility]) at [phone].   Change in your best contact phone number.  Changes in your medications, especially starting a new medication.  Changes in your medical history, including an Emergency room visit, a hospital admission, or you receive a new diagnosis.    Develop any of these symptoms:  fever, cough, sore throat, shortness of breath, chills, muscle pain, new loss of taste or smell, vomiting or diarrhea, or fatigue (extreme tiredness or lack of energy).  Diagnosed with conjunctivitis (pink eye) or shingles.  Diagnosed as COVID positive.  Develop a wound, rash, open area or sores.  If you use or are prescribed an antibiotic.     Also contact your PCP to discuss your symptom(s) and the potential need for treatment/ appointments/etc.     PROCEDURE:    Procedure(s):  NEPHRECTOMY    DATE of SURGERY:   Jun 19, 2023    ARRIVAL LOCATION:   1st Floor Registration     Arrival and diet instruction times will be given the business day prior to your procedure between 2 pm - 5 pm. If you should not hear from anyone by 5 pm the business day prior to your procedure, please call [phone] (Option 4).    Patients and visitors may park for free in the lots in front of the hospital. If you are need assistance from [place] parking lot, we have transportation available to you.  Call security at  [phone] to arrange pick up from the parking lot.    DIET INSTRUCTIONS:   FULL LIQUID diet the morning before surgery/procedure.  CLEAR LIQUID diet 8 hours before the arrival time of surgery/procedure.  STOP clear liquids 3 hours before scheduled surgery time then NPO (NPO means NO FOOD or DRINK).    ACCEPTABLE FULL LIQUIDS:  Beverages include milk, including lactose-free and plant based milks, tea or coffee with milk or cream, but no solid additives or pulp, juice strained to remove pulp.  Soups/Broths include strained cream soups; broth or bullion; pureed soups.  Protein sources include liquid supplements, Ensure/Boosts, etc; protein shakes, powdered protein mixed with liquids/  Dairy/dairy substitutes include yogurt without fruit pieces; pudding or custard; ice cream, sherbet, frozen yogurt- all without solid chunks, seeds, nuts.  Grains include cooked, blended cereals (cream of wheat, cream of rice, grits).  Other liquids include smooth sauces/gravies; gelatin/Jell-O; honey or syrup; butter, margarine, and oils.    ACCEPTABLE CLEAR LIQUIDS:  Water , fruit juices (white grape or apple), Pedialyte, Gatorade, contrast dye (limited to non-particulate forms, such as gastrografin), coffee & tea without fat/milk/creamer, and clear broth without fat or protein.    Your diet instructions are specific to your safety. When not followed, particles left in your stomach could enter your lungs during surgery. Failure to follow the instructions could result in a delay or cancellation  of your surgery.    No tobacco (cigarettes, vaping, snuff, or chewing tobacco) or medical marijuana (all types) 8 hours prior to scheduled arrival time.  No Alcohol, or recreational drugs, including marijuana, 24 hours prior to scheduled arrival time.  Chewing gum is permitted but do not swallow the gum as your surgery may be cancelled if swallowed.    MEDICATION INSTRUCTIONS:    Meds to take day of procedure with a sip of water : Pepcid ,  synthroid ,         Meds to stop: Hydrochlorothiazide  day of surgery, Hold losartan  evening prior and morning of surgery,    Avoid taking:   All NSAIDS (Ibuprofen/Motrin/Advil, Aleve/Naprosyn/Anaprox, Diclofenac/Voltaren, Meloxicam/Mobic, Ketorolac /Toradol ). Vitamin E, Multivitamins, Herbals, Fish Oil, Supplements, & other over the counter meds for 7 days prior to your procedure, unless instructed otherwise. Tylenol  is safe to take up until and including the morning of surgery.    PREP:   Before surgery you can play an important role in your own health. Because skin is not sterile, we need to be sure that your skin is as free from germs as possible before surgery. You can help reduce the number of germs on your skin by carefully washing before surgery. To help make the process more effective, you will need to take 2 showers (one the evening before and one the morning of) using an antibacterial soap. Washing your hair (head) with normal Shampoo is acceptable. Dress in clean clothes after each shower. No shaving for 2 days prior to surgery to prevent any break in the skin. Once you have showered, do not apply deodorant, lotions, moisturizers, makeup, body spray, perfume, cologne, or aftershave unless instructed differently by your Surgeon or their Clinic.    TRANSPORTATION:  You must arrange for a responsible person, 18 or older, to drive you home and stay for 24 hours following discharge after the procedure. Public transportation may only be used in the event you still have the responsible person with you, drivers of the transportation are not responsible for your care.  If you have not arranged for a driver and responsible person to accompany you, your procedure will be cancelled.      If you use home oxygen, bring enough for travel to hospital and return trip home.     OTHER IMPORTANT INFORMATION:  Do not bring money, jewelry, or valuables with you.    Do not wear make-up, nail polish, lotion, powder, or aftershave.  Do not wear any jewelry, watches, earrings, rings, or body piercings.    Piercings, including silicone, will be required to be removed in Pre-op area prior to surgery.  Remove your continuous glucose monitoring system prior to arrival as staff are required to check your glucose using facility monitors. Feel free to bring your device with you as you can reapply it after surgery, upon discharge.    Bring Programmer for implantable devices (Deep Brain Stimulator or Nerve Stimulator for pain/mental health/sleep apnea, etc.) as they may be required to be turned off.  Minors must be accompanied by a parent or legal guardian. The responsible adult must stay with the patient before and after the procedure.  No visitors under age 67 are permitted in the preparation/recovery areas. It is strongly suggested that child-care arrangements be made for other children at home.  If child stays overnight, one parent or other adult family member is encouraged to stay during your child's hospitalization. Each room has a sleeper chair, couch, and shower, you are permitted to  use. Siblings are not allowed to stay overnight. Rules are subject to change due to COVID guidelines.  Children may bring a special toy, doll, or blanket.  Wear glasses instead of contact lenses. If possible, bring a case for your glasses.  It may be possible to wear your hearing aid throughout the procedure. Discuss this with the Pre op nursing staff.  Wear comfortable shoes and clean clothes to the hospital as you will wear them home after the procedure.  Bring any equipment - crutches, splints, etc., that you are already using at home.  Bring any legal paperwork with you (guardianship, custody, Medical Power of Attorney, Designer, industrial/product, etc) if applicable.  Before using the bathroom at the hospital, check with hospital staff for needed specimens.  You must have a valid photo ID to fill prescriptions for narcotics.  If discharged with prescriptions, we offer  an in-house delivery.  Narcotics require in-person pick up with valid ID.  Other medications can be delivered to your bedside.    LODGING:    Both the [employer] and [facility] provide "homes away from home" for families of patients. If you, the patient, plan to stay, a responsible person 43 or older, is required to stay with you for 24 hours following discharge, the same as if you are going home after discharge from facility. The [facility], located across the parking lot from the hospital provides lodging and supportive services to adult patients and their families. To be eligible, guests must live 50 miles or more from the [place] area and request a referral from a hospital staff person to be placed on the waiting list. [facility] staff and volunteers can assist you with hotel reservations, usually at a discounted rate, until a room becomes available. [phone]. The [employer], located to the right of [facility] provides temporary lodging to families of children receiving hospital treatment. [phone].    For more information on local motels and a list of private homes providing rental rooms, contact Social Services at [phone].    SPIRITUAL CARE:  [facility] has chaplains available every day for your spiritual needs. Our Interfaith Prayer and Meditation Room is located at [facility] on the first floor between the [employer] and elevators. You may ask your nurse or staff member to contact the on-call chaplain anytime.    For questions regarding instructions please contact PEC at [phone]. Leave a message if no answer.      CANCEL/PROCEDURE/SURGERY:  If you must cancel your procedure, call [phone] (Option 4) between 6 am and 5:30 pm. After 5:30 pm, call [facility] Healthline at [phone].

## 2023-06-05 ENCOUNTER — Other Ambulatory Visit (HOSPITAL_BASED_OUTPATIENT_CLINIC_OR_DEPARTMENT_OTHER): Payer: Self-pay | Admitting: Urology

## 2023-06-05 ENCOUNTER — Other Ambulatory Visit (HOSPITAL_BASED_OUTPATIENT_CLINIC_OR_DEPARTMENT_OTHER): Payer: Self-pay | Admitting: Family

## 2023-06-05 ENCOUNTER — Encounter (HOSPITAL_BASED_OUTPATIENT_CLINIC_OR_DEPARTMENT_OTHER): Payer: Self-pay | Admitting: Family

## 2023-06-05 DIAGNOSIS — N2889 Other specified disorders of kidney and ureter: Secondary | ICD-10-CM

## 2023-06-05 DIAGNOSIS — Z029 Encounter for administrative examinations, unspecified: Secondary | ICD-10-CM

## 2023-06-05 DIAGNOSIS — Z9889 Other specified postprocedural states: Secondary | ICD-10-CM

## 2023-06-05 HISTORY — DX: Other specified postprocedural states: Z98.890

## 2023-06-05 NOTE — Nursing Note (Signed)
"  Hajiran"    Sister, [name] called stating that Dilia has an upcoming surgery and they want to know if there any instructions beforehand, as far as discontinuation of medication for a period. I did go over the call the day before with [name] and informing of the time and I also referred her to the [facility].  Her best callback number is listed.    Thank you!   Attempted to return call to [place]. Left detailed VM with return call phone number. Will inform her to call PEC @ [phone]. This is with anesthesia and they determine which meds the patient is to hold and take the day of surgery and leading up to surgery. Hosie Macadamia, RN

## 2023-06-05 NOTE — Progress Notes (Signed)
 Multidisciplinary Urologic Tumor Board Note    The patient's most recent clinical information, imaging, and pathology was discussed today at [facility] Multidisciplinary Urologic Tumor Board by the Urology, Medical Oncology, Radiation Oncology, Radiology, and Pathology Services.    Selinda Korzeniewski is a 53 y.o. female with large left renal mass concerning for malignancy.     Plan:  Patient was presented at GU Tumor Board for review of CT chest. Radiologist reviewed imaging and concluded the pulmonary nodules are concerning for metastatic disease and recommended a PET CT. We were able to arrange the PET CT to be completed at [facility]. I was unable to make contact with the patient due to her "sleeping" however I left messages with her son with these instructions.     Daneen Dunk, FNP-C   [place]  [facility]  Department of Urology  Division of Urologic Oncology  06/05/2023, 13:37

## 2023-06-09 ENCOUNTER — Ambulatory Visit (HOSPITAL_BASED_OUTPATIENT_CLINIC_OR_DEPARTMENT_OTHER): Payer: Self-pay | Admitting: Urology

## 2023-06-09 NOTE — Telephone Encounter (Signed)
 Message from Eldonna Greenspan sent at 06/09/2023  3:03 PM EDT    Summary: [name]    Copied From CRM [id number].  Elizabeth [name] Psychologist, occupational) called with a general question or another reason.  [name]    Pts sister is asking to speak to [place]. Please call back.    Thank you                Call History    Contact Date/Time Type Contact Phone/Fax By   06/09/2023 03:02 PM EDT Phone (Incoming) Elizabeth [name] (Brother/Sister) [phone] Elizabeth [name]     I was able to make contact with the sister and advised her of the patient's scheduled PET scan at [facility] on 06/12/23 at 5:15 p.m. Discussed with Elizabeth [name] that after the PET scan is completed someone from the Urology Oncology team will reach out to the patient to discuss the results. Discussed that the patient is scheduled for surgery pre admission on 5/11 and that someone from the pre admission team will reach out when a bed is available for her. Also advised Elizabeth [name] that this had been discussed with the patient's son on 5/1 per her note as well as a MyChart message sent. No other questions were offered at this time.    Bryon Caraway, RN

## 2023-06-12 ENCOUNTER — Other Ambulatory Visit: Payer: Self-pay

## 2023-06-12 ENCOUNTER — Ambulatory Visit
Admission: RE | Admit: 2023-06-12 | Discharge: 2023-06-12 | Disposition: A | Discharge status: Home / Self Care | Payer: Self-pay | Source: Ambulatory Visit | Attending: Urology | Admitting: Urology

## 2023-06-12 DIAGNOSIS — N2889 Other specified disorders of kidney and ureter: Secondary | ICD-10-CM | POA: Insufficient documentation

## 2023-06-12 DIAGNOSIS — Z853 Personal history of malignant neoplasm of breast: Secondary | ICD-10-CM | POA: Insufficient documentation

## 2023-06-14 DIAGNOSIS — N2889 Other specified disorders of kidney and ureter: Secondary | ICD-10-CM

## 2023-06-14 DIAGNOSIS — Z9049 Acquired absence of other specified parts of digestive tract: Secondary | ICD-10-CM

## 2023-06-14 DIAGNOSIS — J3489 Other specified disorders of nose and nasal sinuses: Secondary | ICD-10-CM

## 2023-06-14 DIAGNOSIS — Z9889 Other specified postprocedural states: Secondary | ICD-10-CM

## 2023-06-14 DIAGNOSIS — N1330 Unspecified hydronephrosis: Secondary | ICD-10-CM

## 2023-06-14 DIAGNOSIS — I878 Other specified disorders of veins: Secondary | ICD-10-CM

## 2023-06-15 ENCOUNTER — Inpatient Hospital Stay (HOSPITAL_COMMUNITY)

## 2023-06-15 ENCOUNTER — Inpatient Hospital Stay
Admission: RE | Admit: 2023-06-15 | Discharge: 2023-06-24 | DRG: 657 | Disposition: A | Discharge status: Rehabilitation Facility | Source: Ambulatory Visit | Attending: Urology | Admitting: Urology

## 2023-06-15 ENCOUNTER — Other Ambulatory Visit: Payer: Self-pay

## 2023-06-15 DIAGNOSIS — G40909 Epilepsy, unspecified, not intractable, without status epilepticus: Secondary | ICD-10-CM | POA: Diagnosis present

## 2023-06-15 DIAGNOSIS — Z7985 Long-term (current) use of injectable non-insulin antidiabetic drugs: Secondary | ICD-10-CM

## 2023-06-15 DIAGNOSIS — I10 Essential (primary) hypertension: Secondary | ICD-10-CM | POA: Diagnosis present

## 2023-06-15 DIAGNOSIS — Z79890 Hormone replacement therapy (postmenopausal): Secondary | ICD-10-CM

## 2023-06-15 DIAGNOSIS — I823 Embolism and thrombosis of renal vein: Secondary | ICD-10-CM | POA: Diagnosis present

## 2023-06-15 DIAGNOSIS — Z853 Personal history of malignant neoplasm of breast: Secondary | ICD-10-CM

## 2023-06-15 DIAGNOSIS — N2889 Other specified disorders of kidney and ureter: Principal | ICD-10-CM | POA: Diagnosis present

## 2023-06-15 DIAGNOSIS — Z8673 Personal history of transient ischemic attack (TIA), and cerebral infarction without residual deficits: Secondary | ICD-10-CM

## 2023-06-15 DIAGNOSIS — E119 Type 2 diabetes mellitus without complications: Secondary | ICD-10-CM | POA: Diagnosis present

## 2023-06-15 DIAGNOSIS — J4489 Other specified chronic obstructive pulmonary disease: Secondary | ICD-10-CM | POA: Diagnosis present

## 2023-06-15 DIAGNOSIS — Z85841 Personal history of malignant neoplasm of brain: Secondary | ICD-10-CM

## 2023-06-15 DIAGNOSIS — C652 Malignant neoplasm of left renal pelvis: Principal | ICD-10-CM | POA: Diagnosis present

## 2023-06-15 LAB — BASIC METABOLIC PANEL
ANION GAP: 9 mmol/L (ref 4–13)
BUN/CREA RATIO: 20 (ref 6–22)
BUN: 12 mg/dL (ref 8–25)
CALCIUM: 8.6 mg/dL (ref 8.6–10.2)
CHLORIDE: 106 mmol/L (ref 96–111)
CO2 TOTAL: 24 mmol/L (ref 22–30)
CREATININE: 0.61 mg/dL (ref 0.60–1.05)
ESTIMATED GFR - FEMALE: >90 mL/min/BSA (ref >=60)
GLUCOSE: 127 mg/dL — ABNORMAL HIGH (ref 65–125)
POTASSIUM: 3.6 mmol/L (ref 3.5–5.1)
SODIUM: 139 mmol/L (ref 136–145)

## 2023-06-15 LAB — CBC
HCT: 33.9 % — ABNORMAL LOW (ref 34.8–46.0)
HGB: 11.5 g/dL (ref 11.5–16.0)
MCH: 32.2 pg — ABNORMAL HIGH (ref 26.0–32.0)
MCHC: 33.9 g/dL (ref 31.0–35.5)
MCV: 95 fL (ref 78.0–100.0)
MPV: 8.7 fL (ref 8.7–12.5)
PLATELETS: 189 10*3/uL (ref 150–400)
RBC: 3.57 10*6/uL — ABNORMAL LOW (ref 3.85–5.22)
RDW-CV: 12.8 % (ref 11.5–15.5)
WBC: 7.7 10*3/uL (ref 3.7–11.0)

## 2023-06-15 LAB — MAGNESIUM: MAGNESIUM: 2.1 mg/dL (ref 1.8–2.6)

## 2023-06-15 LAB — PHOSPHORUS: PHOSPHORUS: 3.9 mg/dL (ref 2.4–4.7)

## 2023-06-15 MED ORDER — METHOCARBAMOL 500 MG TABLET
500.0000 mg | ORAL_TABLET | Freq: Three times a day (TID) | ORAL | Status: DC | PRN
Start: 2023-06-15 — End: 2023-06-24
  Administered 2023-06-17 – 2023-06-24 (×5): 500 mg via ORAL
  Filled 2023-06-15 (×5): qty 1

## 2023-06-15 MED ORDER — PHENYTOIN SODIUM EXTENDED 100 MG CAPSULE
300.0000 mg | ORAL_CAPSULE | Freq: Two times a day (BID) | ORAL | Status: DC
Start: 2023-06-15 — End: 2023-06-24
  Administered 2023-06-15 – 2023-06-18 (×8): 300 mg via ORAL
  Administered 2023-06-19 (×2): 0 mg via ORAL
  Administered 2023-06-20 – 2023-06-24 (×9): 300 mg via ORAL
  Filled 2023-06-15 (×20): qty 3

## 2023-06-15 MED ORDER — LOSARTAN 50 MG TABLET
100.0000 mg | ORAL_TABLET | Freq: Every day | ORAL | Status: DC
Start: 2023-06-15 — End: 2023-06-24
  Administered 2023-06-15 – 2023-06-17 (×3): 100 mg via ORAL
  Filled 2023-06-15 (×3): qty 2

## 2023-06-15 MED ORDER — SODIUM CHLORIDE 0.9% FLUSH BAG - 250 ML
INTRAVENOUS | Status: DC | PRN
Start: 2023-06-15 — End: 2023-06-19

## 2023-06-15 MED ORDER — LEVOTHYROXINE 25 MCG TABLET
25.0000 ug | ORAL_TABLET | Freq: Every morning | ORAL | Status: DC
Start: 2023-06-15 — End: 2023-06-24
  Administered 2023-06-15: 0 ug via ORAL
  Administered 2023-06-16 – 2023-06-24 (×9): 25 ug via ORAL
  Filled 2023-06-15 (×10): qty 1

## 2023-06-15 MED ORDER — DEXTROSE 5% IN WATER (D5W) FLUSH BAG - 250 ML
INTRAVENOUS | Status: DC | PRN
Start: 2023-06-15 — End: 2023-06-19

## 2023-06-15 MED ORDER — SODIUM CHLORIDE 0.9 % (FLUSH) INJECTION SYRINGE
2.0000 mL | INJECTION | INTRAMUSCULAR | Status: DC | PRN
Start: 2023-06-15 — End: 2023-06-19

## 2023-06-15 MED ORDER — ESCITALOPRAM 10 MG TABLET
10.0000 mg | ORAL_TABLET | Freq: Every evening | ORAL | Status: DC
Start: 2023-06-15 — End: 2023-06-24
  Administered 2023-06-15 – 2023-06-18 (×4): 10 mg via ORAL
  Administered 2023-06-19: 0 mg via ORAL
  Administered 2023-06-20 – 2023-06-23 (×4): 10 mg via ORAL
  Filled 2023-06-15 (×8): qty 1

## 2023-06-15 MED ORDER — CALCIUM 200 MG (AS CALCIUM CARBONATE 500 MG) CHEWABLE TABLET
500.0000 mg | CHEWABLE_TABLET | Freq: Three times a day (TID) | ORAL | Status: DC | PRN
Start: 2023-06-15 — End: 2023-06-24

## 2023-06-15 MED ORDER — ATORVASTATIN 10 MG TABLET
20.0000 mg | ORAL_TABLET | Freq: Every evening | ORAL | Status: DC
Start: 2023-06-15 — End: 2023-06-24
  Administered 2023-06-15 – 2023-06-18 (×4): 20 mg via ORAL
  Administered 2023-06-19: 0 mg via ORAL
  Administered 2023-06-20 – 2023-06-23 (×4): 20 mg via ORAL
  Filled 2023-06-15 (×8): qty 2

## 2023-06-15 MED ORDER — PHENYTOIN SODIUM EXTENDED 100 MG CAPSULE
100.0000 mg | ORAL_CAPSULE | Freq: Three times a day (TID) | ORAL | Status: DC
Start: 2023-06-15 — End: 2023-06-15

## 2023-06-15 MED ORDER — PROCHLORPERAZINE EDISYLATE 10 MG/2 ML (5 MG/ML) INJECTION SOLUTION
10.0000 mg | Freq: Four times a day (QID) | INTRAMUSCULAR | Status: DC | PRN
Start: 2023-06-15 — End: 2023-06-24

## 2023-06-15 MED ORDER — HYDROCODONE 5 MG-ACETAMINOPHEN 325 MG TABLET
1.0000 | ORAL_TABLET | Freq: Four times a day (QID) | ORAL | Status: DC | PRN
Start: 2023-06-15 — End: 2023-06-19

## 2023-06-15 MED ORDER — ACETAMINOPHEN 325 MG TABLET
650.0000 mg | ORAL_TABLET | Freq: Four times a day (QID) | ORAL | Status: DC
Start: 2023-06-15 — End: 2023-06-19
  Administered 2023-06-15 (×2): 0 mg via ORAL
  Administered 2023-06-15 – 2023-06-16 (×5): 650 mg via ORAL
  Administered 2023-06-17: 0 mg via ORAL
  Administered 2023-06-17 – 2023-06-18 (×5): 650 mg via ORAL
  Administered 2023-06-18: 0 mg via ORAL
  Administered 2023-06-18: 650 mg via ORAL
  Administered 2023-06-19: 0 mg via ORAL
  Filled 2023-06-15 (×12): qty 2

## 2023-06-15 MED ORDER — ONDANSETRON HCL (PF) 4 MG/2 ML INJECTION SOLUTION
4.0000 mg | Freq: Four times a day (QID) | INTRAMUSCULAR | Status: DC | PRN
Start: 2023-06-15 — End: 2023-06-24

## 2023-06-15 MED ORDER — SODIUM CHLORIDE 0.9 % (FLUSH) INJECTION SYRINGE
2.0000 mL | INJECTION | Freq: Three times a day (TID) | INTRAMUSCULAR | Status: DC
Start: 2023-06-15 — End: 2023-06-19
  Administered 2023-06-15: 0 mL
  Administered 2023-06-15 – 2023-06-16 (×3): 6 mL
  Administered 2023-06-16: 0 mL
  Administered 2023-06-16 – 2023-06-17 (×2): 6 mL
  Administered 2023-06-17 (×2): 5 mL
  Administered 2023-06-18: 0 mL
  Administered 2023-06-18: 5 mL
  Administered 2023-06-18 – 2023-06-19 (×2): 0 mL

## 2023-06-15 MED ORDER — HEPARIN (PORCINE) 5,000 UNIT/ML INJECTION SOLUTION
5000.0000 [IU] | Freq: Three times a day (TID) | INTRAMUSCULAR | Status: DC
Start: 2023-06-15 — End: 2023-06-19
  Administered 2023-06-15: 5000 [IU] via SUBCUTANEOUS
  Administered 2023-06-15: 0 [IU] via SUBCUTANEOUS
  Administered 2023-06-16 – 2023-06-19 (×8): 5000 [IU] via SUBCUTANEOUS
  Filled 2023-06-15 (×10): qty 1

## 2023-06-15 MED ORDER — DIPHENHYDRAMINE 25 MG CAPSULE
50.0000 mg | ORAL_CAPSULE | Freq: Four times a day (QID) | ORAL | Status: DC | PRN
Start: 2023-06-15 — End: 2023-06-24
  Administered 2023-06-22: 50 mg via ORAL
  Filled 2023-06-15: qty 2

## 2023-06-15 MED ORDER — TAMSULOSIN 0.4 MG CAPSULE
0.4000 mg | ORAL_CAPSULE | Freq: Every evening | ORAL | Status: DC
Start: 2023-06-15 — End: 2023-06-24
  Administered 2023-06-15 – 2023-06-18 (×4): 0.4 mg via ORAL
  Administered 2023-06-19: 0 mg via ORAL
  Administered 2023-06-20 – 2023-06-23 (×4): 0.4 mg via ORAL
  Filled 2023-06-15 (×8): qty 1

## 2023-06-15 MED ORDER — GADOPICLENOL 0.5 MMOL/ML INTRAVENOUS SOLUTION
9.0000 mL | INTRAVENOUS | Status: AC
Start: 2023-06-15 — End: 2023-06-15
  Administered 2023-06-15: 9 mL via INTRAVENOUS

## 2023-06-15 MED ORDER — HYDROCHLOROTHIAZIDE 25 MG TABLET
25.0000 mg | ORAL_TABLET | Freq: Every morning | ORAL | Status: DC
Start: 2023-06-15 — End: 2023-06-24
  Administered 2023-06-15 – 2023-06-18 (×4): 25 mg via ORAL
  Administered 2023-06-19: 0 mg via ORAL
  Administered 2023-06-20 – 2023-06-24 (×5): 25 mg via ORAL
  Filled 2023-06-15 (×9): qty 1

## 2023-06-15 NOTE — Nurses Notes (Signed)
 Assessed and passed medication. Patient is A&O x 4. Denies any pain, nausea, or vomiting. Assessment charted per flow sheet. Fall precautions maintained. Call light within reach. No questions or concerns at this time.

## 2023-06-15 NOTE — Care Plan (Signed)
 Patient direct admit for surgery on 06/19/23. Patient is A&O X4. Denies pain or NV. Talked about plan of care for the day. Assessment charted per flowsheet.  Low fall precautions maintained.   Problem: Adult Inpatient Plan of Care  Goal: Plan of Care Review  Outcome: Ongoing (see interventions/notes)  Goal: Patient-Specific Goal (Individualized)  Outcome: Ongoing (see interventions/notes)  Flowsheets (Taken 06/15/2023 0718)  Individualized Care Needs: na  Anxieties, Fears or Concerns: building too high  Goal: Absence of Hospital-Acquired Illness or Injury  Outcome: Ongoing (see interventions/notes)  Intervention: Identify and Manage Fall Risk  Recent Flowsheet Documentation  Taken 06/15/2023 0718 by Atlas Blank, RN  Safety Promotion/Fall Prevention:   nonskid shoes/slippers when out of bed   safety round/check completed  Intervention: Prevent Skin Injury  Recent Flowsheet Documentation  Taken 06/15/2023 0718 by Atlas Blank, RN  Body Position: sitting  Skin Protection:   adhesive use limited   transparent dressing maintained  Intervention: Prevent Infection  Recent Flowsheet Documentation  Taken 06/15/2023 0718 by Atlas Blank, RN  Infection Prevention: promote handwashing  Goal: Optimal Comfort and Wellbeing  Outcome: Ongoing (see interventions/notes)  Intervention: Provide Person-Centered Care  Recent Flowsheet Documentation  Taken 06/15/2023 0718 by Atlas Blank, RN  Trust Relationship/Rapport:   care explained   questions answered  Goal: Rounds/Family Conference  Outcome: Ongoing (see interventions/notes)     Problem: Health Knowledge, Opportunity to Enhance (Adult,Obstetrics,Pediatric)  Goal: Knowledgeable about Health Subject/Topic  Description: Patient will demonstrate the desired outcomes by discharge/transition of care.  Outcome: Ongoing (see interventions/notes)  Intervention: Enhance Health Knowledge  Recent Flowsheet Documentation  Taken 06/15/2023 0718 by Atlas Blank, RN  Family/Support System Care: self-care  encouraged     Problem: Fall Injury Risk  Goal: Absence of Fall and Fall-Related Injury  Outcome: Ongoing (see interventions/notes)  Intervention: Promote Injury-Free Environment  Recent Flowsheet Documentation  Taken 06/15/2023 0718 by Atlas Blank, RN  Safety Promotion/Fall Prevention:   nonskid shoes/slippers when out of bed   safety round/check completed     Problem: Surgery Nonspecified  Goal: Absence of Bleeding  Outcome: Ongoing (see interventions/notes)  Goal: Effective Bowel Elimination  Outcome: Ongoing (see interventions/notes)  Goal: Fluid and Electrolyte Balance  Outcome: Ongoing (see interventions/notes)  Goal: Blood Glucose Level Within Target Range  Outcome: Ongoing (see interventions/notes)  Goal: Absence of Infection Signs and Symptoms  Outcome: Ongoing (see interventions/notes)  Goal: Anesthesia/Sedation Recovery  Outcome: Ongoing (see interventions/notes)  Intervention: Optimize Anesthesia Recovery  Recent Flowsheet Documentation  Taken 06/15/2023 0718 by Atlas Blank, RN  Safety Promotion/Fall Prevention:   nonskid shoes/slippers when out of bed   safety round/check completed  Goal: Optimal Pain Control and Function  Outcome: Ongoing (see interventions/notes)  Intervention: Prevent or Manage Pain  Recent Flowsheet Documentation  Taken 06/15/2023 0718 by Atlas Blank, RN  Diversional Activities: smartphone  Goal: Nausea and Vomiting Relief  Outcome: Ongoing (see interventions/notes)  Goal: Effective Urinary Elimination  Outcome: Ongoing (see interventions/notes)  Goal: Effective Oxygenation and Ventilation  Outcome: Ongoing (see interventions/notes)  Intervention: Optimize Oxygenation and Ventilation  Recent Flowsheet Documentation  Taken 06/15/2023 0718 by Atlas Blank, RN  [facility]) Positioning: HOB flat

## 2023-06-15 NOTE — Nurses Notes (Signed)
 Patient taken back to room in bed via transport.

## 2023-06-15 NOTE — H&P (Signed)
 [place]  [facility]  DEPARTMENT OF UROLOGY  HISTORY AND PHYSICAL    Patient: Elizabeth [name], Elizabeth [name], 53 y.o. female  MRN: [id number]  Date of Admission:  06/15/2023  Date of Service:  06/15/2023  Date of Birth:  January 16, 1971  PCP: [facility].    Impression:   53 y.o. female with:    1. 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 Past medical history significant for diabetes, HTN, astrocytoma  3. Past surgical history significant for lap cholecystectomy  4. Blood thinners: None  5. ECOG: 1 - Ambulatory, restricted in strenuous activity, able to carry out work of a light or sedentary nature     Recommendations:    Admit to urology  Regular diet  Home medications restarted as appropriate  MRI thrombus protocol has been ordered to assist in operative planning  OOB ad lib  CBC, BMP ordered  Begin subq heparin  Oral pain medication prn      HPI:    Kendricka [name] is a 53 y.o. female with a history of DM2, HTN, astrocytoma. She initially presented to outside facility with right flank pain found to have a distal ureteral stone. Large left renal mass seen on CT and referred for evaluation. 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 No other apparent disease on our review. She denies gross hematuria. She does not have abdominal pain. Surgical history positive for lap cholecystectomy. She has HTN, Diabetes and is on Ozempic. She has a history of a brain tumor that was treated in 2015 with some chronic mental deficits but no acute changes.      ROS:     ROS Other than ROS in the HPI, all other systems were negative.    PAST MEDICAL/ FAMILY/ SOCIAL HISTORY:    PAST MEDICAL:    Past Medical History:   Diagnosis Date    Anemia     Cancer (CMS HCC)     brain tumor  8/15 with chemo and radiation    CPAP (continuous positive airway pressure) dependence     CVA (cerebrovascular accident)     mini during brain surgery per pt    Diabetes mellitus, type 2     Disorder of thyroid     Epileptic seizure (CMS HCC)     Essential hypertension     Hx of breast cancer     Sleep apnea         Past Surgical History:   Procedure Laterality Date    BRAIN SURGERY      HX CHOLECYSTECTOMY              Medications Prior to Admission       Prescriptions    atorvastatin (LIPITOR) 20 mg Oral Tablet    Take 1 Tablet (20 mg total) by mouth Every night    clobetasoL (CORMAX) 0.05 % Solution    APPLY TO SCALP EVERY DAY TWO WEEKS OUT OF THE MONTH OR LESS    ergocalciferol, vitamin D2, (DRISDOL) 1,250 mcg (50,000 unit) Oral Capsule    AS DIRECTED ONCE A WEEK    escitalopram oxalate (LEXAPRO) 10 mg Oral Tablet    Take 1 Tablet (10 mg total) by mouth Every night    famotidine (PEPCID)  20 mg Oral Tablet    Take 1 Tablet (20 mg total) by mouth Daily    FREESTYLE LANCETS 28 gauge Misc    TEST ONCE DAILY    FREESTYLE LITE METER Kit    EVERY DAY    FREESTYLE LITE STRIPS Strip    TEST EVERY DAY    hydroCHLOROthiazide (HYDRODIURIL) 25 mg Oral Tablet    Take 1 Tablet (25 mg total) by mouth Every morning    HYDROcodone -acetaminophen  (NORCO) 5-325 mg Oral Tablet    Take 1 Tablet by mouth Every 6 hours as needed for Pain    Patient not taking:  Reported on 06/04/2023    ketoconazole (NIZORAL) 2 % Shampoo    PLEASE SEE ATTACHED FOR DETAILED DIRECTIONS    levothyroxine (SYNTHROID) 25 mcg Oral Tablet    TAKE 1 TABLET ONCE A DAY IN THE MORNING 30 MINUTES BEFORE MEALS OR ANY OTHER MEDICATIONS    losartan (COZAAR) 100 mg Oral Tablet    Take 1 Tablet (100 mg total) by mouth Daily    magnesium Oxide 420 mg Oral Tablet    Take 1 Tablet (420 mg total) by mouth Daily    methocarbamoL (ROBAXIN) 500 mg Oral Tablet    Take 1 Tablet (500 mg total) by mouth Three times a day as needed    ondansetron  (ZOFRAN  ODT) 4 mg Oral Tablet, Rapid  Dissolve    Take 1 Tablet (4 mg total) by mouth Every 8 hours as needed for Nausea/Vomiting    Patient not taking:  Reported on 06/04/2023    phenytoin sodium extended release (DILANTIN) 100 mg Oral Capsule    TAKE 3 CAPSULES BY MOUTH TWICE A DAY    semaglutide (OZEMPIC) 0.25 mg or 0.5 mg(2 mg/1.5 mL) Subcutaneous Pen Injector    Inject 0.25 mg under the skin Every 7 days    Patient not taking:  Reported on 06/04/2023    tamsulosin  (FLOMAX ) 0.4 mg Oral Capsule    Take 1 Capsule (0.4 mg total) by mouth Every evening after dinner          Allergies   Allergen Reactions    Ace Inhibitors      Other reaction(s): Angioedema       Family History  Family Medical History:       Problem Relation (Age of Onset)    Arthritis-osteo Mother    Breast Cancer Paternal Grandmother    Heart Disease Father    Hypertension (High Blood Pressure) Mother    No Known Problems Sister, Brother, Maternal Grandmother, Maternal Grandfather, Paternal Grandfather, Daughter, Son, Maternal Aunt, Maternal Uncle, Paternal Aunt, Paternal Education officer, community, Other            Social History  Social History     Socioeconomic History    Marital status: Divorced   Tobacco Use    Smoking status: Never    Smokeless tobacco: Never   Vaping Use    Vaping status: Never Used   Substance and Sexual Activity    Alcohol use: Never    Drug use: Never   Other Topics Concern    Ability to Walk 1 Flight of Steps without SOB/CP Yes    Routine Exercise No    Ability to Walk 2 Flight of Steps without SOB/CP No    Ability To Do Own ADL's Yes     Social Determinants of Health     Social Connections: Low Risk  (06/15/2023)    Social Connections     SDOH Social Isolation:  5 or more times a week        PHYSICAL EXAMINATION:       Constitutional (e.g. vital signs, general appearance) - 53 y.o. female, no distress  Cardiovascular - Pulse palpable.  Respiratory - Unlabored respiratory effort  Abdomen - Soft, non-distended, non-tender  Skin - Warm and dry  Neurological - CN II-XII grossly intact,  A&O x 3  Psychiatric - Normal affect  Hematologic/lymphatic/immunologic - No petechiae.  Genitourinary - No flank TTP      Labs Ordered/ Reviewed   Reviewed: Labs:  Lab Results for Last 24 Hours:   No results found for any visits on 06/15/23 (from the past 24 hours).     Radiology Tests Ordered/ Reviewed    No results found.        Verneda Golder, MD   [place]  [facility]  Department of Urology   PGY3  06/15/2023, 08:13      I saw and examined the patient.  I reviewed the resident's note.  I agree with the findings and plan of care as documented in the resident's note.  Any exceptions/additions are edited/noted.    Laurel Harnden Abdelhalim Sybil Euler, MD

## 2023-06-15 NOTE — Ancillary Notes (Signed)
 [place]  [facility]  MRI Technologist Note        MRI has been completed.        Lorrane Rosette, RT (R)(MR) 06/15/2023, 20:47

## 2023-06-15 NOTE — Nurses Notes (Addendum)
 Verneda Golder paged per patient request for dilantin 100 mg 3 capsules BID    1745: Dr Con Decant paged per patient request for CPAP orders

## 2023-06-15 NOTE — Nurses Notes (Signed)
 Patient arrived to MRI in bed via transport.

## 2023-06-16 DIAGNOSIS — D49512 Neoplasm of unspecified behavior of left kidney: Secondary | ICD-10-CM

## 2023-06-16 DIAGNOSIS — N2889 Other specified disorders of kidney and ureter: Secondary | ICD-10-CM

## 2023-06-16 DIAGNOSIS — R918 Other nonspecific abnormal finding of lung field: Secondary | ICD-10-CM

## 2023-06-16 LAB — HGA1C (HEMOGLOBIN A1C WITH EST AVG GLUCOSE)
ESTIMATED AVERAGE GLUCOSE: 88 mg/dL
HEMOGLOBIN A1C: 4.7 % (ref 4.0–5.6)

## 2023-06-16 MED ORDER — POLYETHYLENE GLYCOL 3350 17 GRAM ORAL POWDER PACKET
34.0000 g | Freq: Every day | ORAL | Status: DC
Start: 2023-06-17 — End: 2023-06-24
  Administered 2023-06-17: 34 g via ORAL
  Administered 2023-06-18 – 2023-06-19 (×2): 0 g via ORAL
  Administered 2023-06-20: 34 g via ORAL
  Administered 2023-06-21 – 2023-06-24 (×4): 0 g via ORAL
  Filled 2023-06-16 (×5): qty 2

## 2023-06-16 MED ORDER — POLYETHYLENE GLYCOL 3350 17 GRAM ORAL POWDER PACKET
17.0000 g | Freq: Every day | ORAL | Status: DC
Start: 2023-06-16 — End: 2023-06-16
  Administered 2023-06-16: 17 g via ORAL
  Filled 2023-06-16: qty 1

## 2023-06-16 NOTE — Care Plan (Signed)
 Pt alert and oriented x4.  Denies any pain, nausea, or vomiting.  Fall precautions maintained.  Assessment done per flowsheet.  Call bell within reach.  Plan of care ongoing.    Problem: Adult Inpatient Plan of Care  Goal: Plan of Care Review  Outcome: Ongoing (see interventions/notes)  Goal: Patient-Specific Goal (Individualized)  Outcome: Ongoing (see interventions/notes)  Flowsheets (Taken 06/16/2023 0745)  Individualized Care Needs: na  Anxieties, Fears or Concerns: none stated  Goal: Absence of Hospital-Acquired Illness or Injury  Outcome: Ongoing (see interventions/notes)  Intervention: Identify and Manage Fall Risk  Recent Flowsheet Documentation  Taken 06/16/2023 0745 by Geni Keto, RN  Safety Promotion/Fall Prevention:   fall prevention program maintained   nonskid shoes/slippers when out of bed   safety round/check completed  Intervention: Prevent Skin Injury  Recent Flowsheet Documentation  Taken 06/16/2023 0745 by Geni Keto, RN  Body Position: supine, head elevated  Intervention: Prevent and Manage VTE (Venous Thromboembolism) Risk  Recent Flowsheet Documentation  Taken 06/16/2023 0745 by Geni Keto, RN  VTE Prevention/Management: ambulation promoted  Goal: Optimal Comfort and Wellbeing  Outcome: Ongoing (see interventions/notes)  Intervention: Provide Person-Centered Care  Recent Flowsheet Documentation  Taken 06/16/2023 0745 by Geni Keto, RN  Trust Relationship/Rapport: care explained  Goal: Rounds/Family Conference  Outcome: Ongoing (see interventions/notes)     Problem: Health Knowledge, Opportunity to Enhance (Adult,Obstetrics,Pediatric)  Goal: Knowledgeable about Health Subject/Topic  Description: Patient will demonstrate the desired outcomes by discharge/transition of care.  Outcome: Ongoing (see interventions/notes)  Intervention: Enhance Health Knowledge  Recent Flowsheet Documentation  Taken 06/16/2023 0745 by Geni Keto, RN  Family/Support System Care: self-care encouraged     Problem: Fall Injury Risk  Goal:  Absence of Fall and Fall-Related Injury  Outcome: Ongoing (see interventions/notes)  Intervention: Promote Injury-Free Environment  Recent Flowsheet Documentation  Taken 06/16/2023 0745 by Geni Keto, RN  Safety Promotion/Fall Prevention:   fall prevention program maintained   nonskid shoes/slippers when out of bed   safety round/check completed     Problem: Surgery Nonspecified  Goal: Absence of Bleeding  Outcome: Ongoing (see interventions/notes)  Goal: Effective Bowel Elimination  Outcome: Ongoing (see interventions/notes)  Goal: Fluid and Electrolyte Balance  Outcome: Ongoing (see interventions/notes)  Goal: Blood Glucose Level Within Target Range  Outcome: Ongoing (see interventions/notes)  Goal: Absence of Infection Signs and Symptoms  Outcome: Ongoing (see interventions/notes)  Goal: Anesthesia/Sedation Recovery  Outcome: Ongoing (see interventions/notes)  Intervention: Optimize Anesthesia Recovery  Recent Flowsheet Documentation  Taken 06/16/2023 0745 by Geni Keto, RN  Safety Promotion/Fall Prevention:   fall prevention program maintained   nonskid shoes/slippers when out of bed   safety round/check completed  Goal: Optimal Pain Control and Function  Outcome: Ongoing (see interventions/notes)  Goal: Nausea and Vomiting Relief  Outcome: Ongoing (see interventions/notes)  Goal: Effective Urinary Elimination  Outcome: Ongoing (see interventions/notes)  Goal: Effective Oxygenation and Ventilation  Outcome: Ongoing (see interventions/notes)  Intervention: Optimize Oxygenation and Ventilation  Recent Flowsheet Documentation  Taken 06/16/2023 0745 by Geni Keto, RN  Head of Bed [facility]) Positioning: HOB at 30-45 degrees

## 2023-06-16 NOTE — Consults (Signed)
 Department of Anesthesiology  Consult     ANESTHESIA PRE-OP EVALUATION  Planned Procedure: NEPHRECTOMY (Left)    Elizabeth [name] is a 53 y.o. female with PMH of diabetes, HTN, astrocytoma admitted 06/15/2023 for right flank pain, found to have distal ureteral stone and large left renal mass found on CT, 15cm with invasion into the renal vein and potentially the IVC. Patient is scheduled for a nephrectomy on 05/15 with Dr. Lavonna Prader.     Current Infusions:      Current Ventilator Settings:      Anesthetic/Airway Hx:  No prior anesthetic hx   Current LDA:   Peripheral IV Left;Proximal Basilic  (medial side of arm) (Active)          Blood Products:  None       DM2  Takes Ozempic, last Ha1c 4.7 yesterday. Hold Ozempic 7 days prior to surgery    HTN  Takes Losartan, HCTZ for HTN. Please hold day prior to surgery.    RECOMMENDATION  - Please optimize electrolytes K>4, Phos >3, Mag >2  - Smoking cessation, optimization of bronchodilators for Asthma/COPD in pre-op phase  - Patient would benefit from active incentive spirometer, early ambulation following surgery and multi-modal pain management approach.  DVT prophylaxis as appropriate.  - Recommend type and screen w/ 2u crossmatch for date of surgery  - Patient will need to hold Losartan the day prior to surgery  - Patient will need to hold GLP-1 agonist for 7 days prior to surgery  - Patient will be evaluated on day of surgery for appropriateness for surgery    DIET INSTRUCTIONS   - Regular diet until 8 hours prior to surgery. Clear liquids until 2 hours prior to arrival. No oral tobacco or mints for 8 hours prior to surgery.       ROS:         EXAM:  Temperature: 37.2 C (99 F)  Heart Rate: 71  BP (Non-Invasive): 119/63  Respiratory Rate: 16  SpO2: 94 %         ECG ordered for preop eval  Other Studies: None           [name], MD  06/16/2023, 13:43

## 2023-06-16 NOTE — Care Management Notes (Signed)
 [facility]  Care Management Initial Evaluation    Patient Name: Elizabeth [name]  Date of Birth: 08/25/1970  Sex: female  Date/Time of Admission: 06/15/2023  7:10 AM  Room/Bed: 922/A  Payor: [employer] / Plan: [place] BC[employer] PPO / Product Type: PPO /   Primary Care Providers:  [facility], [facility] (General)    Pharmacy Info:   Preferred Pharmacy       [id number] [place], [place] - [address]    [address] [place] [address]    Phone: [phone] Fax: [phone]    Hours: Not open 24 hours          Emergency Contact Info:   Extended Emergency Contact Information  Primary Emergency Contact: Adami,B.J.  Mobile Phone: [phone]  Relation: Son  Preferred language: English  Interpreter needed? No  Secondary Emergency Contact: Donnamaria Gable  Mobile Phone: [phone]  Relation: Sister    History:   Elizabeth [name] is a 53 y.o., female, admitted left kidney mass    Height/Weight: 157.5 cm (5\' 2" ) / 89.4 kg (197 lb 1.5 oz)     LOS: 1 day   Admitting Diagnosis: Kidney mass [N28.89]    Assessment:      06/16/23 1411   Assessment Details   Assessment Type Admission   Date of Care Management Update 06/16/23   Date of Next DCP Update 06/19/23   Readmission   Is this a readmission? No   Insurance Information/Type   Insurance type Commercial   Employment/Financial   Patient has Prescription Coverage?  Yes   Financial/Environmental Concerns none   Living Environment   Select an age group to open "lives with" row.  Adult   Lives With child(ren), dependent;child(ren), adult   Living Arrangements apartment   Able to Return to Prior Arrangements yes   Home Safety   Home Assessment: No Problems Identified   Home Accessibility no concerns   Care Management Plan   Discharge Planning Status initial meeting   Discharge plan discussed with: Patient   Discharge Needs Assessment   Equipment Currently Used at Home cane, straight   Discharge Facility/Level of Care Needs Home  with Home Health (code 6)   Transportation Available family or friend will provide   Referral Information   Admission Type inpatient   Address Verified verified-no changes   Arrived From home or self-care   ADVANCE DIRECTIVES   Does the Patient have an Advance Directive? No, Information Offered and Given   Patient Requests Assistance in Having Advance Directive Notarized. N/A   LAY CAREGIVER    Appointed Lay Caregiver? Yes     53 yr old female admitted to [facility] left kidney mass, labs and diagnostics, MRI, IVF, for OR, left nephrectomy    Discharge Plan:  Home vs home with Home Health  [facility] met with pt at bedside for initial assessment, CCC role reviewed, home address and phone numbers verified, pt lives in an apartment with 12 steps to enter, adult son and daughter live with her, her daughter has severe Autism, insurance and PCP verified, last appointment was in April, pt has no advance directives, MPOA paperwork given  to pt, instructed to complete page 1 and ask for care management, sister, [name] is lay care giver,CM added her contact to face sheet, no home health, uses a cane, has transportation home, anticipate home with hh vs SNF, will follow    The patient will continue to  be evaluated for developing discharge needs.     Case Manager: Willow Harvard, CASE MANAGER  Phone: [phone]

## 2023-06-16 NOTE — Progress Notes (Addendum)
 [place]   MEDICINE  UROLOGY PROGRESS NOTE   Patient: Elizabeth [name], Elizabeth [name], 53 y.o. female  Date of Admission:  06/15/2023  Date of Birth:  Jun 25, 1970  Date of Service:  06/16/2023     ASSESSMENT:    53 y.o. female:    1. 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 Past medical history significant for diabetes, HTN, astrocytoma  3. Past surgical history significant for lap cholecystectomy  4. Blood thinners: None  5. ECOG: 1 - Ambulatory, restricted in strenuous activity, able to carry out work of a light or sedentary nature      PLAN:   - Regular diet  - RAP consult for epidural on Wednesday, will hold AM dose of Heparin on Wednesday  - Plan for OR on Thursday for left nephrectomy  - Incentive spirometer 10x / hr  - Encourage ambulation    Diet: Regular   Anticoagulation: SQH   Pain regimen: Tylenol , Robaxin   Bowel regimen: Miralax   Foley catheter: N/a   Drain: N/a   Antibiotics: N/a   IV fluids: N/a   Microbiology: N/a   Imaging: N/a   PT/OT recs: Pending surgery     DISPOSITION: Floor status, anticipate discharge pending surgery and recovery     SUBJECTIVE:   No acute events overnight. Patient on AM rounds was ambulating to bathroom independently. Denies pain, nausea, or emesis.      OBJECTIVE:   Temperature: 36.5 C (97.7 F)  Heart Rate: 83  BP (Non-Invasive): 125/63  Respiratory Rate: 16  SpO2: 94 %  PHYSICAL EXAM:  General: 53 y.o. female not in acute distress.    Skin: Warm and dry.     Eyes: Eyes are clear.    Pulmonary: Respiratory effort is unlabored.     Psychiatric: Patient is alert, appropriate mood.    Cardiovascular: Palpable peripheral pulses.    Neurologic: CN II-XII grossly intact, A&O x 3     Gastrointestinal: The abdomen is soft, nontender and  non-distended.    Genitourinary: Deferred        ATTESTATION:   Vincenza Greener, MD   [place]  [facility]  Department of Urology    06/16/2023, 06:21     I saw and examined the patient.  I reviewed the resident's note.   I agree with the findings and plan of care as documented in the resident's note.   Any exceptions/additions are edited/noted.    Reviewed MRI and discussed surgical plan with the patient.  Pulmonary nodules remain indeterminate and not avid on PET-CT, plan to proceed with local therapy.   Consult Anesthesia for pre-operative evaluation, will plan on needing arterial and central venous line with central venous pressure monitoring during the case.   RAP team for thoracic epidural.  Will obtain written surgical consent.  Plan to proceed with surgery this Thursday.    Mina Alter, MD  Assistant Professor, Surgeon   Division of Urologic Oncology  Department of Urology   [place]  [facility]

## 2023-06-16 NOTE — Pharmacy (Signed)
 Pharmacy Medication Reconciliation    Patient Name: Elizabeth [name], Elizabeth [name]  Date of Service: 06/16/2023  Date of Admission: 06/15/2023  Date of Birth: 29-Dec-1970  Length of Stay:   1 day   Service: UROLOGY      Transitions of Care:  Discharge Pharmacy services were discussed with the patient and the Meds to [facility] flowsheet and preferred pharmacy information were updated in EMR if applicable and able to assess with patient.    Information was collected from:  Patient    [id number] [place], [place] - [address]  [address]  [place] [address]  Phone: [phone] Fax: [phone]    Spoke to PT over the phone, and she manages her own home meds. PT knew her home meds and had no questions or concerns at this time.     Please note: cefdinir  300 mg was filled 3/11 for #14 (7ds)    Clarified Prior to Admission Medications:  Prior to Admission medications    Medication Sig Taking Resumed Y/N (RPh) Comments   atorvastatin (LIPITOR) 20 mg Oral Tablet Take 1 Tablet (20 mg total) by mouth Every night Yes    Filled 3/27 for #90       chromic chloride (CHROMIUM CHLORIDE ORAL) Take 1 Tablet by mouth Daily Yes   OTC          CINNAMON BARK EXTRACT ORAL Take 2 Capsules by mouth Daily Yes    OTC       clobetasoL (CORMAX) 0.05 % Solution APPLY TO SCALP EVERY DAY TWO WEEKS OUT OF THE MONTH OR LESS  Patient not taking: Reported on 06/16/2023    No fill hx    PT stated she no longer uses       ergocalciferol, vitamin D2, (DRISDOL) 1,250 mcg (50,000 unit) Oral Capsule Take 1 Capsule (50,000 Units total) by mouth Every Sunday   Yes   Filled 2/19 for #12           escitalopram oxalate (LEXAPRO) 10 mg Oral Tablet Take 1 Tablet (10 mg total) by mouth Every night  Patient not taking: Reported on 06/16/2023       Filled 12/29 for #90    PT now on 20mg    escitalopram oxalate (LEXAPRO) 20 mg Oral Tablet   Take 1 Tablet (20 mg total) by mouth Daily Yes  Filled 3/29 for #90       famotidine (PEPCID) 20 mg Oral  Tablet Take 1 Tablet (20 mg total) by mouth Daily Yes    Filled 3/27 for #90       FREESTYLE LANCETS 28 gauge Misc TEST ONCE DAILY Yes   -    PT states she takes her BG when she remembers but she often forgets      FREESTYLE LITE METER Kit EVERY DAY Yes   -        FREESTYLE LITE STRIPS Strip TEST EVERY DAY Yes   -        hydroCHLOROthiazide (HYDRODIURIL) 25 mg Oral Tablet Take 1 Tablet (25 mg total) by mouth Every morning Yes    Filled 3/27 for #90         HYDROcodone -acetaminophen  (NORCO) 5-325 mg Oral Tablet Take 1 Tablet by mouth Every 6 hours as needed for Pain  Patient not taking: Reported on 06/16/2023    Filled 3/11 for #12              ketoconazole (NIZORAL) 2 % Shampoo  PLEASE SEE ATTACHED FOR DETAILED DIRECTIONS  Patient not taking: Reported on 06/16/2023    No fill hx    PT stated she no longer uses     levothyroxine (SYNTHROID) 25 mcg Oral Tablet TAKE 1 TABLET ONCE A DAY IN THE MORNING 30 MINUTES BEFORE MEALS OR ANY OTHER MEDICATIONS Yes   Filled 4/7 for #90              losartan (COZAAR) 100 mg Oral Tablet Take 1 Tablet (100 mg total) by mouth Daily Yes   Filled 3/27 for #90        magnesium Oxide 420 mg Oral Tablet Take 1 Tablet (420 mg total) by mouth Daily Yes   OTC        methocarbamoL (ROBAXIN) 500 mg Oral Tablet Take 1 Tablet (500 mg total) by mouth Three times a day as needed Yes   Filled 4/11 for #270          ondansetron  (ZOFRAN  ODT) 4 mg Oral Tablet, Rapid Dissolve Take 1 Tablet (4 mg total) by mouth Every 8 hours as needed for Nausea/Vomiting  Patient not taking: Reported on 06/16/2023    Filled 2/27 for #9              phenytoin sodium extended release (DILANTIN) 100 mg Oral Capsule TAKE 3 CAPSULES BY MOUTH TWICE A DAY Yes    Filled 4/9 for #540           semaglutide (OZEMPIC) 0.25 mg or 0.5 mg(2 mg/1.5 mL) Subcutaneous Pen Injector Inject 0.25 mg under the skin Every 7 days  Patient not taking: Reported on 06/04/2023    Filled 2/19 for #90ds    Dr told PT to stop this med          tamsulosin   (FLOMAX ) 0.4 mg Oral Capsule Take 1 Capsule (0.4 mg total) by mouth Every evening after dinner  Patient not taking: Reported on 06/16/2023    Filled 3/11 for #30    PT no longer on              Did patient's home medication list require updates? Yes    Medications UPDATED on Prior to Admission Med List:  Lexapro updated from 10 mg to 20 mg  Vit D2 updated to show PT takes every Sunday    Medications ADDED to Prior to Admission Med List:  Chromium and cinnamon    Allergies:  PT confirmed  Allergies   Allergen Reactions    Ace Inhibitors      Other reaction(s): Angioedema       Maegan Casimir, PHARMACY INTERN    Reviewed by:  Nell Bamberger, PharmD, FCCP, BCPS  Clinical Pharmacist, 

## 2023-06-16 NOTE — Nurses Notes (Addendum)
 Assessed and passed medication. Patient is A&O x 4. Denies any pain, nausea, or vomiting. Assessment charted per flow sheet. Fall precautions maintained. Call light within reach. No questions or concerns at this time.    2330: Report called to Polly Brink, RN 8SE.    0000: Pt moving off unit to 8SE via bed and transport.

## 2023-06-17 ENCOUNTER — Ambulatory Visit (INDEPENDENT_AMBULATORY_CARE_PROVIDER_SITE_OTHER): Admitting: NURSE PRACTITIONER

## 2023-06-17 LAB — BASIC METABOLIC PANEL
ANION GAP: 11 mmol/L (ref 4–13)
BUN/CREA RATIO: 18 (ref 6–22)
BUN: 11 mg/dL (ref 8–25)
CALCIUM: 8.9 mg/dL (ref 8.6–10.2)
CHLORIDE: 108 mmol/L (ref 96–111)
CO2 TOTAL: 21 mmol/L — ABNORMAL LOW (ref 22–30)
CREATININE: 0.6 mg/dL (ref 0.60–1.05)
ESTIMATED GFR - FEMALE: >90 mL/min/BSA (ref >=60)
GLUCOSE: 96 mg/dL (ref 65–125)
POTASSIUM: 3.6 mmol/L (ref 3.5–5.1)
SODIUM: 140 mmol/L (ref 136–145)

## 2023-06-17 LAB — CBC
HCT: 33.7 % — ABNORMAL LOW (ref 34.8–46.0)
HGB: 11.7 g/dL (ref 11.5–16.0)
MCH: 33.1 pg — ABNORMAL HIGH (ref 26.0–32.0)
MCHC: 34.7 g/dL (ref 31.0–35.5)
MCV: 95.2 fL (ref 78.0–100.0)
MPV: 9 fL (ref 8.7–12.5)
PLATELETS: 214 10*3/uL (ref 150–400)
RBC: 3.54 10*6/uL — ABNORMAL LOW (ref 3.85–5.22)
RDW-CV: 13.1 % (ref 11.5–15.5)
WBC: 8.3 10*3/uL (ref 3.7–11.0)

## 2023-06-17 LAB — ABO & RH: ABO/RH(D): O POS

## 2023-06-17 LAB — PHOSPHORUS: PHOSPHORUS: 4.4 mg/dL (ref 2.4–4.7)

## 2023-06-17 LAB — MAGNESIUM: MAGNESIUM: 2 mg/dL (ref 1.8–2.6)

## 2023-06-17 MED ORDER — SODIUM CHLORIDE 0.9 % IV BOLUS
40.0000 mL | INJECTION | Freq: Once | Status: DC | PRN
Start: 2023-06-17 — End: 2023-06-24

## 2023-06-17 NOTE — Progress Notes (Signed)
 [employer]  [place] MEDICINE  UROLOGY PROGRESS NOTE   Patient: Elizabeth [name], Elizabeth [name], 53 y.o. female  Date of Admission:  06/15/2023  Date of Birth:  Dec 04, 1970  Date of Service:  06/17/2023     ASSESSMENT:    53 y.o. female:    1. 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 Past medical history significant for diabetes, HTN, astrocytoma  3. Past surgical history significant for lap cholecystectomy  4. Blood thinners: None  5. ECOG: 1 - Ambulatory, restricted in strenuous activity, able to carry out work of a light or sedentary nature      PLAN:   - Regular diet  - RAP consult for epidural on tomorrow (5/14), will hold AM dose of Heparin at that time  - Plan for OR on Thursday for left nephrectomy  - Incentive spirometer 10x / hr  - Encourage ambulation    Diet: Regular   Anticoagulation: SQH   Pain regimen: Tylenol , Robaxin   Bowel regimen: Miralax   Foley catheter: N/a   Drain: N/a   Antibiotics: N/a   IV fluids: N/a   Microbiology: N/a   Imaging: N/a   PT/OT recs: Pending surgery     DISPOSITION: Floor status, anticipate discharge pending surgery and recovery     SUBJECTIVE:   No overnight events. No nausea, vomiting. No fevers     OBJECTIVE:   Temperature: 36.8 C (98.2 F)  Heart Rate: 74  BP (Non-Invasive): 93/68  Respiratory Rate: 16  SpO2: 96 %  PHYSICAL EXAM:  General: 53 y.o. female not in acute distress.    Skin: Warm and dry.     Eyes: Eyes are clear.    Pulmonary: Respiratory effort is unlabored.     Psychiatric: Patient is alert, appropriate mood.    Cardiovascular: Palpable peripheral pulses.    Neurologic: CN II-XII grossly intact, A&O x 3     Gastrointestinal: The abdomen is soft, nontender and non-distended.    Genitourinary: Deferred        ATTESTATION:    Verneda Golder, MD  [place]  [place]   Department of Urology  PGY3 Resident    06/17/2023 06:30    I saw and examined the patient.  I reviewed the resident's note.   I agree with the findings and plan of care as documented in the resident's note.   Any exceptions/additions are edited/noted.    Mina Alter, MD  Assistant Professor, Surgeon   Division of Urologic Oncology  Department of Urology   [place]  [facility] Medicine

## 2023-06-17 NOTE — Care Plan (Signed)
 Problem: Adult Inpatient Plan of Care  Goal: Plan of Care Review  Outcome: Ongoing (see interventions/notes)  Goal: Patient-Specific Goal (Individualized)  Outcome: Ongoing (see interventions/notes)  Goal: Absence of Hospital-Acquired Illness or Injury  Outcome: Ongoing (see interventions/notes)  Intervention: Prevent Skin Injury  Recent Flowsheet Documentation  Taken 06/17/2023 0100 by Cristopher Donovan, RN  Skin Protection:   adhesive use limited   transparent dressing maintained   tubing/devices free from skin contact  Intervention: Prevent Infection  Recent Flowsheet Documentation  Taken 06/17/2023 0100 by Cristopher Donovan, RN  Infection Prevention:   barrier precautions utilized   rest/sleep promoted  Goal: Optimal Comfort and Wellbeing  Outcome: Ongoing (see interventions/notes)  Intervention: Provide Person-Centered Care  Recent Flowsheet Documentation  Taken 06/17/2023 0100 by Rasaan Brotherton F, RN  Trust Relationship/Rapport:   care explained   choices provided   emotional support provided   empathic listening provided   questions answered   questions encouraged   reassurance provided   thoughts/feelings acknowledged  Goal: Rounds/Family Conference  Outcome: Ongoing (see interventions/notes)     Problem: Health Knowledge, Opportunity to Enhance (Adult,Obstetrics,Pediatric)  Goal: Knowledgeable about Health Subject/Topic  Description: Patient will demonstrate the desired outcomes by discharge/transition of care.  Outcome: Ongoing (see interventions/notes)  Intervention: Enhance Health Knowledge  Recent Flowsheet Documentation  Taken 06/17/2023 0100 by Cristopher Donovan, RN  Family/Support System Care: self-care encouraged     Problem: Fall Injury Risk  Goal: Absence of Fall and Fall-Related Injury  Outcome: Ongoing (see interventions/notes)  Intervention: Identify and Manage Contributors  Recent Flowsheet Documentation  Taken 06/17/2023 0100 by Cristopher Donovan, RN  Medication Review/Management: medications reviewed     Problem: Surgery  Nonspecified  Goal: Absence of Bleeding  Outcome: Ongoing (see interventions/notes)  Goal: Effective Bowel Elimination  Outcome: Ongoing (see interventions/notes)  Goal: Fluid and Electrolyte Balance  Outcome: Ongoing (see interventions/notes)  Goal: Blood Glucose Level Within Target Range  Outcome: Ongoing (see interventions/notes)  Goal: Absence of Infection Signs and Symptoms  Outcome: Ongoing (see interventions/notes)  Goal: Anesthesia/Sedation Recovery  Outcome: Ongoing (see interventions/notes)  Goal: Optimal Pain Control and Function  Outcome: Ongoing (see interventions/notes)  Intervention: Prevent or Manage Pain  Recent Flowsheet Documentation  Taken 06/17/2023 0100 by Jakyron Fabro F, RN  Diversional Activities:   television   tablet  Goal: Nausea and Vomiting Relief  Outcome: Ongoing (see interventions/notes)  Goal: Effective Urinary Elimination  Outcome: Ongoing (see interventions/notes)  Goal: Effective Oxygenation and Ventilation  Outcome: Ongoing (see interventions/notes)

## 2023-06-17 NOTE — Ancillary Notes (Signed)
 [facility]  Spiritual Care Note    Patient Name:  Elizabeth [name]  Date of Encounter:   06/17/2023    Pertinent Information: I encountered Pt while rounding on the unit per MSW referral for additional support. Pt engaged in processing her story and reflecting on her past illness and surgery. As she did, she transitioned to reflecting on how she is processing her current illness and anticipation for surgery. As she processed her emotions, she identified how her family and faith have been sources for support that help her to adjust with this new normal. I prayed with Pt per her request as prayer is a meaningful practice which connects her more intentionally to her faith. Spiritual Care remains available 24/7 to offer support should there be a need. *1950     06/17/23 1015   Clinical Encounter Type   Reason for Visit Patient/Person/Family Request   Referral From Other(comment)  (MSW [name])   Declined Spiritual Care Visit At This Time No   Visited With Patient   Patient Spiritual Encounters   Spiritual Needs/Issues Adjustment to new diagnosis   Spiritual/Coping Resources Beliefs helpful in coping;Beliefs in God/Sacred/Higher Purpose;Loved/supported by family   Interpersonal/Family Stressors Children at home   Coping Explored emotions;Explored/supported faith and beliefs;Facilitated meaning making;Facilitated spiritual reflection;Facilitated story telling;Offered empathy;Provided supportive presence;Relationship building   Ritual Prayer   Other Support Services Provided Non-anxious presence   Information/Education Provided Spiritual Care scope of service   Patients Hopes/Goals   Patient's Hopes/Goals no   Spiritual Care outcomes with Patient   Spiritual/Emotional Processing  Patient shared/processed his/her/their story;Patient processed emotions;Connected to spiritual support    Patient Coping Coping more effectively   Mutuality/Individual Preferences   Individualized Care Needs Daughter at home needs full time  care   Time of Encounters   Start Time 0935   Stop Time 1015   Duration (minutes) 40 Minutes       Takai Chiaramonte, BCC  Pager: [phone]  Total Time of Encounter: 50 min.

## 2023-06-17 NOTE — Care Plan (Signed)
 Patient low fall risk; up ad lib. Tolerating a full liquid diet without complaints of nausea. LBM 5/12 and voids spontaneously without difficulty.  No c/o pain. Dressings to PIV remain CDI. Discharge planning ongoing.       Problem: Adult Inpatient Plan of Care  Goal: Plan of Care Review  Outcome: Ongoing (see interventions/notes)  Flowsheets (Taken 06/17/2023 1602)  Plan of Care Reviewed With:   patient   family  Goal: Patient-Specific Goal (Individualized)  Outcome: Ongoing (see interventions/notes)  Flowsheets (Taken 06/17/2023 1602)  Patient/Family-Specific Goals (Include Timeframe): scheduled nephrectomy 5/15  Goal: Absence of Hospital-Acquired Illness or Injury  Outcome: Ongoing (see interventions/notes)  Goal: Optimal Comfort and Wellbeing  Outcome: Ongoing (see interventions/notes)     Problem: Health Knowledge, Opportunity to Enhance (Adult,Obstetrics,Pediatric)  Goal: Knowledgeable about Health Subject/Topic  Description: Patient will demonstrate the desired outcomes by discharge/transition of care.  Outcome: Ongoing (see interventions/notes)     Problem: Fall Injury Risk  Goal: Absence of Fall and Fall-Related Injury  Outcome: Ongoing (see interventions/notes)     Problem: Surgery Nonspecified  Goal: Absence of Bleeding  Outcome: Ongoing (see interventions/notes)  Goal: Effective Bowel Elimination  Outcome: Ongoing (see interventions/notes)  Goal: Fluid and Electrolyte Balance  Outcome: Ongoing (see interventions/notes)  Goal: Blood Glucose Level Within Target Range  Outcome: Ongoing (see interventions/notes)  Goal: Absence of Infection Signs and Symptoms  Outcome: Ongoing (see interventions/notes)  Goal: Anesthesia/Sedation Recovery  Outcome: Ongoing (see interventions/notes)  Goal: Optimal Pain Control and Function  Outcome: Ongoing (see interventions/notes)  Goal: Nausea and Vomiting Relief  Outcome: Ongoing (see interventions/notes)  Goal: Effective Urinary Elimination  Outcome: Ongoing (see  interventions/notes)  Goal: Effective Oxygenation and Ventilation  Outcome: Ongoing (see interventions/notes)

## 2023-06-18 ENCOUNTER — Inpatient Hospital Stay (HOSPITAL_COMMUNITY)

## 2023-06-18 DIAGNOSIS — R109 Unspecified abdominal pain: Secondary | ICD-10-CM

## 2023-06-18 DIAGNOSIS — G8918 Other acute postprocedural pain: Secondary | ICD-10-CM

## 2023-06-18 LAB — BASIC METABOLIC PANEL
ANION GAP: 10 mmol/L (ref 4–13)
BUN/CREA RATIO: 11 (ref 6–22)
BUN: 8 mg/dL (ref 8–25)
CALCIUM: 9.3 mg/dL (ref 8.6–10.2)
CHLORIDE: 105 mmol/L (ref 96–111)
CO2 TOTAL: 24 mmol/L (ref 22–30)
CREATININE: 0.71 mg/dL (ref 0.60–1.05)
ESTIMATED GFR - FEMALE: >90 mL/min/BSA (ref >=60)
GLUCOSE: 107 mg/dL (ref 65–125)
POTASSIUM: 3.7 mmol/L (ref 3.5–5.1)
SODIUM: 139 mmol/L (ref 136–145)

## 2023-06-18 LAB — CBC
HCT: 37.9 % (ref 34.8–46.0)
HGB: 12.8 g/dL (ref 11.5–16.0)
MCH: 32.2 pg — ABNORMAL HIGH (ref 26.0–32.0)
MCHC: 33.8 g/dL (ref 31.0–35.5)
MCV: 95.5 fL (ref 78.0–100.0)
MPV: 8.9 fL (ref 8.7–12.5)
PLATELETS: 244 10*3/uL (ref 150–400)
RBC: 3.97 10*6/uL (ref 3.85–5.22)
RDW-CV: 13 % (ref 11.5–15.5)
WBC: 8.2 10*3/uL (ref 3.7–11.0)

## 2023-06-18 LAB — PHOSPHORUS: PHOSPHORUS: 4.2 mg/dL (ref 2.4–4.7)

## 2023-06-18 LAB — MAGNESIUM: MAGNESIUM: 2.1 mg/dL (ref 1.8–2.6)

## 2023-06-18 MED ORDER — DEXTROSE 5% IN WATER (D5W) FLUSH BAG - 250 ML
INTRAVENOUS | Status: DC | PRN
Start: 2023-06-18 — End: 2023-06-19

## 2023-06-18 MED ORDER — SODIUM CHLORIDE 0.9 % (FLUSH) INJECTION SYRINGE
2.0000 mL | INJECTION | Freq: Three times a day (TID) | INTRAMUSCULAR | Status: DC
Start: 2023-06-18 — End: 2023-06-19
  Administered 2023-06-18: 2 mL
  Administered 2023-06-18 – 2023-06-19 (×2): 0 mL

## 2023-06-18 MED ORDER — SODIUM CHLORIDE 0.9 % (FLUSH) INJECTION SYRINGE
2.0000 mL | INJECTION | INTRAMUSCULAR | Status: DC | PRN
Start: 2023-06-18 — End: 2023-06-19

## 2023-06-18 MED ORDER — WATER FOR INJECTION, STERILE INJECTION SOLUTION
2.0000 g | Freq: Once | INTRAMUSCULAR | Status: DC
Start: 2023-06-19 — End: 2023-06-18

## 2023-06-18 MED ORDER — SODIUM CHLORIDE 0.9 % INTRAVENOUS SOLUTION
INTRAVENOUS | Status: DC
Start: 2023-06-18 — End: 2023-06-18
  Filled 2023-06-18: qty 25

## 2023-06-18 MED ORDER — MIDAZOLAM 1 MG/ML INJECTION WRAPPER
2.0000 mg | Freq: Once | INTRAMUSCULAR | Status: DC | PRN
Start: 2023-06-18 — End: 2023-06-18

## 2023-06-18 MED ORDER — FENTANYL (PF) 50 MCG/ML INJECTION SOLUTION
100.0000 ug | Freq: Once | INTRAMUSCULAR | Status: DC | PRN
Start: 2023-06-18 — End: 2023-06-18

## 2023-06-18 MED ORDER — SODIUM CHLORIDE 0.9 % (FLUSH) INJECTION SYRINGE
20.0000 mL | INJECTION | Freq: Once | INTRAMUSCULAR | Status: DC | PRN
Start: 2023-06-18 — End: 2023-06-19

## 2023-06-18 MED ORDER — NALOXONE 0.4 MG/ML INJECTION SOLUTION
0.1000 mg | INTRAMUSCULAR | Status: DC | PRN
Start: 2023-06-18 — End: 2023-06-24

## 2023-06-18 MED ORDER — SODIUM CHLORIDE 0.9 % INTRAVENOUS SOLUTION
INTRAVENOUS | Status: DC
Start: 2023-06-19 — End: 2023-06-20
  Administered 2023-06-20: 0 mL via INTRAVENOUS

## 2023-06-18 MED ORDER — FENTANYL 2 MCG/ML + ROPIVACAINE 0.2 % IN NS 250 ML (TOTAL VOL) EPIDURAL INFUSION
INJECTION | Status: DC
Start: 2023-06-18 — End: 2023-06-22
  Administered 2023-06-18 (×2): 2 mL/h via EPIDURAL
  Administered 2023-06-19: 4 mL/h via EPIDURAL
  Administered 2023-06-19: 2 mL/h via EPIDURAL
  Administered 2023-06-19: 1 mL/h via EPIDURAL
  Administered 2023-06-19: 2 mL/h via EPIDURAL
  Administered 2023-06-19 (×2): 3 mL/h via EPIDURAL
  Administered 2023-06-19 – 2023-06-22 (×2): 0 mL/h via EPIDURAL
  Filled 2023-06-18: qty 220
  Filled 2023-06-18: qty 30
  Filled 2023-06-18: qty 230

## 2023-06-18 MED ORDER — SODIUM CHLORIDE 0.9% FLUSH BAG - 250 ML
INTRAVENOUS | Status: DC | PRN
Start: 2023-06-18 — End: 2023-06-19

## 2023-06-18 NOTE — Progress Notes (Signed)
 [place]  [place] MEDICINE  UROLOGY PROGRESS NOTE   Patient: Elizabeth [name], Elizabeth [name], 53 y.o. female  Date of Admission:  06/15/2023  Date of Birth:  14-Sep-1970  Date of Service:  06/18/2023     ASSESSMENT:    54 y.o. female:    1. 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 Past medical history significant for diabetes, HTN, astrocytoma  3. Past surgical history significant for lap cholecystectomy  4. Blood thinners: None  5. ECOG: 1 - Ambulatory, restricted in strenuous activity, able to carry out work of a light or sedentary nature      PLAN:   - Full liquid diet  - RAP consult for epidural today, AM heparin  held  - Plan for OR on Thursday for left nephrectomy with caval thrombectomy, consent obtained and type and cross x6 units PRBC  - Incentive spirometer 10x / hr  - Encourage ambulation    Diet: FLD   Anticoagulation: SQH   Pain regimen: Tylenol , Robaxin    Bowel regimen: Miralax    Foley catheter: N/a   Drain: N/a   Antibiotics: N/a   IV fluids: N/a   Microbiology: N/a   Imaging: N/a   PT/OT recs: Pending surgery     DISPOSITION: Floor status, anticipate discharge pending surgery and recovery     SUBJECTIVE:   No overnight events. No nausea, vomiting. No fevers.     OBJECTIVE:   Temperature: 36.8 C (98.2 F)  Heart Rate: 72  BP (Non-Invasive): 122/69  Respiratory Rate: 16  SpO2: 96 %  PHYSICAL EXAM:  General: 53 y.o. female not in acute distress.    Skin: Warm and dry.     Eyes: Eyes are clear.    Pulmonary: Respiratory effort is unlabored.     Psychiatric: Patient is alert, appropriate mood.    Cardiovascular: Palpable peripheral pulses.    Neurologic: CN II-XII grossly intact, A&O x 3     Gastrointestinal: The abdomen is soft, nontender and non-distended.    Genitourinary:  Deferred        ATTESTATION:   David Zekan, MD    I was immediately available for this patient encounter.  I reviewed the resident's note.   I agree with the findings and plan of care as documented in the resident's note.   Any exceptions/additions are edited/noted.    Mina Alter, MD  Assistant Professor, Surgeon   Division of Urologic Oncology  Department of Urology   [place]  [facility] Medicine

## 2023-06-18 NOTE — Nurses Notes (Addendum)
 [id number]  Labs for morning? Potassium has been less than 4.0, they want it to be >4.0 before her nephrectomey thursday. Also, can you hold losartan for tomorrow and hctz needs to be held day of I believe. Thanks, [id number]

## 2023-06-18 NOTE — Anesthesia Procedure Notes (Addendum)
 Elizabeth [name]    Neuraxial Block    Performed by:   Performing Provider: Earma Gloss, MD   Authorizing provider: Margurette Shillings, MD  I was present and supervised/observed the entire procedure.  Margurette Shillings, MD 06/18/2023, 13:53    Sedation        Blocks   Block: epidural - thoracic   Type of block: catheter   Indication:at surgeon's request and Acute postoperative pain   Diagnosis: abdominal pain    Requesting surgeon: Claressa Crock, MD  Technique:  Pt location: At bedside  Approach: midline        Preprocedure hand washing was performed sterile field maintained   Needle level: T10-11  Skin Prep: Hand hygiene performed, Sterile field established, Sterilely prepped and draped, Sterile gloves, Cap, Mask, Sterile drape and Sterile technique   Preanesthesia Checklist:  Pre anesthesia checklist: site marked, surgical consent, monitors and equipment checked, timeout performed, anesthesia consent, emergency drugs available and positioning concerns  Position:  Positioning: sitting   Skin Local:  Skin local: Lidocaine  1%   Spinal Needle:             Epidural Needle:      Epidural needle gauge: 18 G  Epidural needle length: 3.5 in.  Epidural placement technique: LOR saline  Epidural needle depth: 7cm.  Epidural needle attempts: 1  Epidural Catheter:  Epidural catheter type: multi-port  Epidural catheter length in space : 5cm   Epidural depth at skin:12cm   Epidural catheter size:20 G    Block Events:  Procedure Events:neg aspiration and no complications   Test dose:  Test dose amount :3mL. With lidocaine  1.5% with epinephrine 1:200,000  Intravenous test negative Subarachnoid test negative  Test dose reaction: Negative        Medications    Dosing  Injection made incrementally with aspirations every: 5 mL  Catheter secured and sterile dressing applied.   Patient response comfortable.    NOTES  Block free text:Elizabeth Rice, DO 06/18/2023  [facility]  [phone]

## 2023-06-18 NOTE — Consults (Signed)
 [facility]  Acute Perioperative Pain Service Consult         Date of Service:  06/18/2023  Elizabeth [name], 53 y.o. female  Encounter Start Date:  06/15/2023  Inpatient Admission Date:  06/15/2023  Date of Birth:  [birth year]    Plan:   Elizabeth [name] is a 53 y.o. female who is scheduled for  Procedure(s) (LRB):  NEPHRECTOMY (Left) with anticipated post-operative pain.  - Offered patient Thoracic Epidural.  Patient agreeable to procedure.   - Scheduled and PRN medications per primary service.   - For any questions please page or call the Acute Perioperative Pain Service at [phone].   - If not already done, please place consult order to IP Acute Perioperative Pain --RAP Thank you for your consultation.     Suggested Coding/Billing: Level 3    Type of Pain Consultation: Perioperative Pain  Consult Requested By: Dr. Donald Frost  Reason for Consult: Acute Postoperative Pain (ICD-10-G89.18)  Laterality, Type of Surgery, nephrectomy     Chief Complaint/Pain location: abdominal    History of Present Illness: Elizabeth [name] is a 53 y.o., White female  with chief complaint as above. Anesthesiology Perioperative Pain service consulted for evaluation of abdominal pain. The pain is anticipated to be described as sharp post surgical pain. Pain will be located at the surgical site. Onset is anticipated to start post-operatively. Aggravating factors: movement.  Alleviating factors: pain medications.       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 Yes  History of Anticoagulant Use? No    Social History:   Social History     Tobacco Use     Smoking status: Never    Smokeless tobacco: Never   Vaping Use    Vaping status: Never Used   Substance Use Topics    Alcohol use: Never    Drug use: Never       Past Family History:   Family Medical History:       Problem Relation (Age of Onset)    Arthritis-osteo Mother    Breast Cancer Paternal Grandmother    Heart Disease Father    Hypertension (High Blood Pressure) Mother    No Known Problems Sister, Brother, Maternal Grandmother, Maternal Grandfather, Paternal Grandfather, Daughter, Son, Maternal Aunt, Maternal Uncle, Paternal Aunt, Paternal Uncle, Other            ROS: Constitutional: negative for recent illnesses   Ears, nose, mouth, throat, and face: negative neck stiffness  Respiratory: negative for shortness of breath  Cardiovascular: negative for chest pain and dyspnea  Gastrointestinal: negative for nausea, vomiting  Integument/breast: negative for rash and skin lesion  Hematologic/lymphatic: negative for easy bruising and bleeding  Musculoskeletal: negative for neck pain  Neurological: negative for numbness and tingling  Allergy:   Allergies   Allergen Reactions    Ace Inhibitors      Other reaction(s): Angioedema  Exam:   Temperature: 36.6 C (97.9 F)  Heart Rate: 70  BP (Non-Invasive): 135/66  Respiratory Rate: 17  SpO2: 99 %  General: appears in good health  HENT: Head atraumatic. normocephalic, ENT without erythema or injection, mucous membranes moist.  Neck: supple, symmetrical, trachea midline.  Lungs: Non-labored breathing.  Cardiovascular: regular rate and rhythm. Appears well perfused.   Extremities: extremities normal, atraumatic, no cyanosis or edema  Skin: No rashes or lesions  Neurologic: Alert and oriented x3. No gross motor or sensory deficits in bilateral extremities.  Psychiatric: Normal affect, behavior, speech      Labs:  I have reviewed all lab results.            I discussed treatment plan with patient including risks and benefits of procedures and patient consents.        Cozette Divine, DO   06/18/2023 12:02  Department of Anesthesiology  [facility] Pager [phone]      I saw and examined the patient.  I reviewed the resident's note.  I agree with the findings and plan of care as documented in the resident's note.  Any exceptions/additions are edited/noted.    Margurette Shillings, MD

## 2023-06-18 NOTE — Care Plan (Signed)
 Problem: Adult Inpatient Plan of Care  Goal: Plan of Care Review  Outcome: Ongoing (see interventions/notes)  Goal: Patient-Specific Goal (Individualized)  Outcome: Ongoing (see interventions/notes)  Goal: Absence of Hospital-Acquired Illness or Injury  Outcome: Ongoing (see interventions/notes)  Goal: Optimal Comfort and Wellbeing  Outcome: Ongoing (see interventions/notes)  Intervention: Provide Person-Centered Care  Recent Flowsheet Documentation  Taken 06/17/2023 1900 by Cristopher Donovan, RN  Trust Relationship/Rapport:   care explained   choices provided   emotional support provided   empathic listening provided   questions answered   questions encouraged   reassurance provided   thoughts/feelings acknowledged  Goal: Rounds/Family Conference  Outcome: Ongoing (see interventions/notes)     Problem: Health Knowledge, Opportunity to Enhance (Adult,Obstetrics,Pediatric)  Goal: Knowledgeable about Health Subject/Topic  Description: Patient will demonstrate the desired outcomes by discharge/transition of care.  Outcome: Ongoing (see interventions/notes)  Intervention: Enhance Health Knowledge  Recent Flowsheet Documentation  Taken 06/17/2023 1900 by Cristopher Donovan, RN  Family/Support System Care: self-care encouraged     Problem: Fall Injury Risk  Goal: Absence of Fall and Fall-Related Injury  Outcome: Ongoing (see interventions/notes)     Problem: Surgery Nonspecified  Goal: Absence of Bleeding  Outcome: Ongoing (see interventions/notes)  Goal: Effective Bowel Elimination  Outcome: Ongoing (see interventions/notes)  Goal: Fluid and Electrolyte Balance  Outcome: Ongoing (see interventions/notes)  Goal: Blood Glucose Level Within Target Range  Outcome: Ongoing (see interventions/notes)  Goal: Absence of Infection Signs and Symptoms  Outcome: Ongoing (see interventions/notes)  Goal: Anesthesia/Sedation Recovery  Outcome: Ongoing (see interventions/notes)  Goal: Optimal Pain Control and Function  Outcome: Ongoing (see  interventions/notes)  Intervention: Prevent or Manage Pain  Recent Flowsheet Documentation  Taken 06/17/2023 1900 by Cristopher Donovan, RN  Diversional Activities:   television   tablet  Goal: Nausea and Vomiting Relief  Outcome: Ongoing (see interventions/notes)  Goal: Effective Urinary Elimination  Outcome: Ongoing (see interventions/notes)  Goal: Effective Oxygenation and Ventilation  Outcome: Ongoing (see interventions/notes)

## 2023-06-18 NOTE — Anesthesia Preprocedure Evaluation (Addendum)
 ANESTHESIA PRE-OP EVALUATION  Planned Procedure: NEPHRECTOMY (Left)  Review of Systems     anesthesia history negative     patient summary reviewed  nursing notes reviewed        Pulmonary   sleep apnea,   Cardiovascular    Hypertension and ECG reviewed ,No peripheral edema,  Exercise Tolerance: > or = 4 METS        GI/Hepatic/Renal   negative GI/hepatic/renal ROS,         Endo/Other    anemia,      Neuro/Psych/MS    Left sided facial droop and weakness in RLE, CVA     Cancer  CA,   breast cancer,                     Physical Assessment      Airway       Mallampati: III    TM distance: <3 FB    Neck ROM: full  Mouth Opening: good.            Dental           (+) missing           Pulmonary    Breath sounds clear to auscultation  (-) no rhonchi, no decreased breath sounds, no wheezes, no rales and no stridor     Cardiovascular    Rhythm: regular  Rate: Normal  (-) no friction rub, carotid bruit is not present, no peripheral edema and no murmur     Other findings              Plan  ASA 3     Planned anesthesia type: general     general anesthesia with endotracheal tube intubation      PONV Plan:  I plan to administer pharmcologic prophalaxis antiemetics          Additional Plans: Central line and Arterial line    Intravenous induction     Anesthesia issues/risks discussed are: PONV, Dental Injuries, Eye /Visual Loss, Cardiac Events/MI, Stroke, Blood Loss, Sore Throat, Nerve Injuries, Aspiration, Post-op Intubation/Ventilation, Post-op Pain Management and Intraoperative Awareness/ Recall.  Anesthetic plan and risks discussed with patient  signed consent obtained      Use of blood products discussed with patient who consented to blood products.      Patient's NPO status is appropriate for Anesthesia.           Plan discussed with attending and resident.    (Discussed central line and arterial line plan with patient. Patient is consented for GETA, central line, arterial line and is okay with blood/blood products if  needed. Discussion regarding potential need for breathing tube after surgery and ICU stay, patient verbalized understanding.     Elijio Gu[place], DO    )

## 2023-06-18 NOTE — Nurses Notes (Signed)
 Epidural placed by anesthesia at bedside, anesthesia ordered fentanyl 5mcg/ml and ropivacaine 0.2% in NS 250 ml to be administered. When anesthesia came to program PCA pump, after the bag was spiked it was noted that this was the wrong dose. Soyla Duverney RN and [employer] RN wasted the incorrect dose and anesthesia ordered the correct dose to be administered

## 2023-06-19 ENCOUNTER — Encounter (HOSPITAL_COMMUNITY): Payer: Self-pay | Admitting: Urology

## 2023-06-19 ENCOUNTER — Encounter (HOSPITAL_COMMUNITY)
Admission: RE | Disposition: A | Discharge status: Rehabilitation Facility | Payer: Self-pay | Source: Ambulatory Visit | Attending: Urology

## 2023-06-19 ENCOUNTER — Inpatient Hospital Stay (HOSPITAL_COMMUNITY)

## 2023-06-19 DIAGNOSIS — C642 Malignant neoplasm of left kidney, except renal pelvis: Secondary | ICD-10-CM

## 2023-06-19 DIAGNOSIS — I823 Embolism and thrombosis of renal vein: Secondary | ICD-10-CM

## 2023-06-19 LAB — CBC
HCT: 32.3 % — ABNORMAL LOW (ref 34.8–46.0)
HGB: 11.1 g/dL — ABNORMAL LOW (ref 11.5–16.0)
MCH: 32.2 pg — ABNORMAL HIGH (ref 26.0–32.0)
MCHC: 34.4 g/dL (ref 31.0–35.5)
MCV: 93.6 fL (ref 78.0–100.0)
MPV: 8.8 fL (ref 8.7–12.5)
PLATELETS: 208 10*3/uL (ref 150–400)
RBC: 3.45 10*6/uL — ABNORMAL LOW (ref 3.85–5.22)
RDW-CV: 13 % (ref 11.5–15.5)
WBC: 6.6 10*3/uL (ref 3.7–11.0)

## 2023-06-19 LAB — BASIC METABOLIC PANEL
ANION GAP: 8 mmol/L (ref 4–13)
ANION GAP: 5 mmol/L (ref 4–13)
BUN/CREA RATIO: 9 (ref 6–22)
BUN/CREA RATIO: 9 (ref 6–22)
BUN: 6 mg/dL — ABNORMAL LOW (ref 8–25)
BUN: 6 mg/dL — ABNORMAL LOW (ref 8–25)
CALCIUM: 8.3 mg/dL — ABNORMAL LOW (ref 8.6–10.2)
CALCIUM: 7.2 mg/dL — ABNORMAL LOW (ref 8.6–10.2)
CHLORIDE: 108 mmol/L (ref 96–111)
CHLORIDE: 112 mmol/L — ABNORMAL HIGH (ref 96–111)
CO2 TOTAL: 24 mmol/L (ref 22–30)
CO2 TOTAL: 20 mmol/L — ABNORMAL LOW (ref 22–30)
CREATININE: 0.66 mg/dL (ref 0.60–1.05)
CREATININE: 0.67 mg/dL (ref 0.60–1.05)
ESTIMATED GFR - FEMALE: >90 mL/min/BSA (ref >=60)
ESTIMATED GFR - FEMALE: >90 mL/min/BSA (ref >=60)
GLUCOSE: 102 mg/dL (ref 65–125)
GLUCOSE: 179 mg/dL — ABNORMAL HIGH (ref 65–125)
POTASSIUM: 3.4 mmol/L — ABNORMAL LOW (ref 3.5–5.1)
POTASSIUM: 3.6 mmol/L (ref 3.5–5.1)
SODIUM: 140 mmol/L (ref 136–145)
SODIUM: 137 mmol/L (ref 136–145)

## 2023-06-19 LAB — CBC WITH DIFF
BASOPHIL #: <0.1 10*3/uL (ref <=0.20)
BASOPHIL %: 0.3 %
EOSINOPHIL #: <0.1 10*3/uL (ref <=0.50)
EOSINOPHIL %: 0.1 %
HCT: 34.9 % (ref 34.8–46.0)
HGB: 11.9 g/dL (ref 11.5–16.0)
IMMATURE GRANULOCYTE #: <0.1 10*3/uL (ref <0.10)
IMMATURE GRANULOCYTE %: 0.3 % (ref 0.0–1.0)
LYMPHOCYTE #: 1.25 10*3/uL (ref 1.00–4.80)
LYMPHOCYTE %: 11.4 %
MCH: 32.2 pg — ABNORMAL HIGH (ref 26.0–32.0)
MCHC: 34.1 g/dL (ref 31.0–35.5)
MCV: 94.6 fL (ref 78.0–100.0)
MONOCYTE #: 0.61 10*3/uL (ref 0.20–1.10)
MONOCYTE %: 5.6 %
MPV: 8.8 fL (ref 8.7–12.5)
NEUTROPHIL #: 9 10*3/uL — ABNORMAL HIGH (ref 1.50–7.70)
NEUTROPHIL %: 82.3 %
PLATELETS: 198 10*3/uL (ref 150–400)
RBC: 3.69 10*6/uL — ABNORMAL LOW (ref 3.85–5.22)
RDW-CV: 13.2 % (ref 11.5–15.5)
WBC: 10.9 10*3/uL (ref 3.7–11.0)

## 2023-06-19 LAB — BLOOD GAS W/ CO-OX, LYTES, LACTATE REFLEX
%FIO2 (ARTERIAL): 61 %
%FIO2 (VENOUS): 57 %
%FIO2 (VENOUS): 61 %
(T) PCO2: 41 mmHg (ref 35.0–45.0)
(T) PCO2: 50 mmHg (ref 41.0–51.0)
(T) PCO2: 39 mmHg — ABNORMAL LOW (ref 41.0–51.0)
(T) PO2: 151 mmHg — ABNORMAL HIGH (ref 83.0–108.0)
(T) PO2: 57 mmHg (ref 35–50)
(T) PO2: 131 mmHg (ref 35–50)
BASE DEFICIT: 1 mmol/L (ref 0.0–3.0)
BASE DEFICIT: 2.3 mmol/L (ref 0.0–3.0)
BASE DEFICIT: 6.2 mmol/L — ABNORMAL HIGH (ref 0.0–3.0)
BICARBONATE (ARTERIAL): 24.2 mmol/L (ref 21.0–28.0)
BICARBONATE (VENOUS): 22.8 mmol/L (ref 22.0–29.0)
BICARBONATE (VENOUS): 20.1 mmol/L — ABNORMAL LOW (ref 22.0–29.0)
CARBOXYHEMOGLOBIN: 1.8 % (ref <=3.0)
CARBOXYHEMOGLOBIN: 1.9 % (ref <=3.0)
CARBOXYHEMOGLOBIN: 1.4 % (ref <=3.0)
CHLORIDE: 105 mmol/L (ref 98–107)
CHLORIDE: 107 mmol/L (ref 98–107)
CHLORIDE: 109 mmol/L — ABNORMAL HIGH (ref 98–107)
GLUCOSE: 108 mg/dL (ref 65–125)
GLUCOSE: 124 mg/dL (ref 65–125)
GLUCOSE: 175 mg/dL — ABNORMAL HIGH (ref 65–125)
HEMATOCRITRT: 36 % — ABNORMAL LOW (ref 37–50)
HEMATOCRITRT: 38 % (ref 37–50)
HEMATOCRITRT: 30 % — ABNORMAL LOW (ref 37–50)
HEMOGLOBIN: 11.9 g/dL — ABNORMAL LOW (ref 12.0–18.0)
HEMOGLOBIN: 12.8 g/dL (ref 12.0–18.0)
HEMOGLOBIN: 10 g/dL — ABNORMAL LOW (ref 12.0–18.0)
IONIZED CALCIUM: 1.17 mmol/L (ref 1.15–1.33)
IONIZED CALCIUM: 1.22 mmol/L (ref 1.15–1.33)
IONIZED CALCIUM: 1.09 mmol/L — ABNORMAL LOW (ref 1.15–1.33)
LACTATE: 0.8 mmol/L (ref <=1.9)
LACTATE: 1.2 mmol/L (ref <=1.9)
LACTATE: 0.8 mmol/L (ref <=1.9)
MET-HEMOGLOBIN: 0.8 % (ref <=1.5)
MET-HEMOGLOBIN: <0.7 % (ref <=1.5)
MET-HEMOGLOBIN: 1.1 % (ref <=1.5)
O2 SATURATION (ARTERIAL): 99.3 % (ref 94.0–98.0)
O2 SATURATION (VENOUS): 85.9 % (ref 40.0–85.0)
O2 SATURATION (VENOUS): 98.7 % (ref 40.0–85.0)
O2CT: 15.4 %
O2CT: 16.5 %
O2CT: 13.8 %
OXYHEMOGLOBIN: 85.5 % (ref 40.0–80.0)
OXYHEMOGLOBIN: 97.1 % — ABNORMAL HIGH (ref 90.0–95.0)
OXYHEMOGLOBIN: 96.4 % (ref 40.0–80.0)
PAO2/FIO2 RATIO: 252
PCO2 (ARTERIAL): 42 mmHg (ref 35–45)
PCO2 (VENOUS): 50 mmHg (ref 41–51)
PCO2 (VENOUS): 39 mmHg — ABNORMAL LOW (ref 41–51)
PH (ARTERIAL): 7.37 (ref 7.35–7.45)
PH (T): 7.3 — ABNORMAL LOW (ref 7.32–7.43)
PH (T): 7.38 (ref 7.35–7.45)
PH (T): 7.31 — ABNORMAL LOW (ref 7.32–7.43)
PH (VENOUS): 7.3 — ABNORMAL LOW (ref 7.32–7.43)
PH (VENOUS): 7.31 — ABNORMAL LOW (ref 7.32–7.43)
PO2 (ARTERIAL): 154 mmHg — ABNORMAL HIGH (ref 83–108)
PO2 (VENOUS): 57 mmHg (ref 35–50)
PO2 (VENOUS): 131 mmHg (ref 35–50)
SODIUM: 135 mmol/L — ABNORMAL LOW (ref 136–145)
SODIUM: 137 mmol/L (ref 136–145)
SODIUM: 137 mmol/L (ref 136–145)
TEMPERATURE, COMP: 36.4 C (ref 15.0–40.0)
TEMPERATURE, COMP: 37 C (ref 15.0–40.0)
TEMPERATURE, COMP: 37 C (ref 15.0–40.0)
WHOLE BLOOD POTASSIUM: 3.6 mmol/L (ref 3.5–5.1)
WHOLE BLOOD POTASSIUM: 3.6 mmol/L (ref 3.5–5.1)
WHOLE BLOOD POTASSIUM: 3.4 mmol/L — ABNORMAL LOW (ref 3.5–5.1)

## 2023-06-19 LAB — POC BLOOD GLUCOSE (RESULTS)
GLUCOSE, POC: 113 mg/dL (ref 65–125)
GLUCOSE, POC: 180 mg/dL — ABNORMAL HIGH (ref 65–125)

## 2023-06-19 LAB — PHOSPHORUS: PHOSPHORUS: 4.1 mg/dL (ref 2.4–4.7)

## 2023-06-19 LAB — MAGNESIUM: MAGNESIUM: 1.9 mg/dL (ref 1.8–2.6)

## 2023-06-19 SURGERY — NEPHRECTOMY
Anesthesia: General | Site: Kidney | Laterality: Left | Wound class: Clean Contaminated Wounds-The respiratory, GI, Genital, or urinary

## 2023-06-19 MED ORDER — KETAMINE 10 MG/ML INJECTION WRAPPER
Freq: Once | INTRAMUSCULAR | Status: DC | PRN
Start: 2023-06-19 — End: 2023-06-19
  Administered 2023-06-19: 20 mg via INTRAVENOUS
  Administered 2023-06-19: 30 mg via INTRAVENOUS

## 2023-06-19 MED ORDER — PROCHLORPERAZINE EDISYLATE 10 MG/2 ML (5 MG/ML) INJECTION SOLUTION
5.0000 mg | Freq: Once | INTRAMUSCULAR | Status: DC | PRN
Start: 2023-06-19 — End: 2023-06-19

## 2023-06-19 MED ORDER — PROPOFOL 10 MG/ML INTRAVENOUS EMULSION
INTRAVENOUS | Status: DC | PRN
Start: 2023-06-19 — End: 2023-06-19
  Administered 2023-06-19: 0 ug/kg/min via INTRAVENOUS
  Administered 2023-06-19: 50 ug/kg/min via INTRAVENOUS
  Administered 2023-06-19: 25 ug/kg/min via INTRAVENOUS

## 2023-06-19 MED ORDER — GLYCOPYRROLATE 0.2 MG/ML INJECTION SOLUTION
INTRAMUSCULAR | Status: AC
Start: 2023-06-19 — End: 2023-06-19
  Filled 2023-06-19: qty 1

## 2023-06-19 MED ORDER — HYDROCODONE 5 MG-ACETAMINOPHEN 325 MG TABLET
2.0000 | ORAL_TABLET | Freq: Four times a day (QID) | ORAL | Status: DC | PRN
Start: 2023-06-19 — End: 2023-06-24
  Administered 2023-06-20 – 2023-06-22 (×2): 2 via ORAL
  Administered 2023-06-22: 0 via ORAL
  Administered 2023-06-23: 2 via ORAL
  Filled 2023-06-19 (×4): qty 2

## 2023-06-19 MED ORDER — PROCHLORPERAZINE EDISYLATE 10 MG/2 ML (5 MG/ML) INJECTION SOLUTION
5.0000 mg | Freq: Three times a day (TID) | INTRAMUSCULAR | Status: DC | PRN
Start: 2023-06-19 — End: 2023-06-19

## 2023-06-19 MED ORDER — HYDROMORPHONE (PF) 0.5 MG/0.5 ML INJECTION SYRINGE
0.2000 mg | INJECTION | INTRAMUSCULAR | Status: DC | PRN
Start: 2023-06-19 — End: 2023-06-24

## 2023-06-19 MED ORDER — WATER FOR INJECTION, STERILE INJECTION SOLUTION
2.0000 g | Freq: Once | INTRAMUSCULAR | Status: AC
Start: 2023-06-19 — End: 2023-06-19
  Administered 2023-06-19 (×2): 2 g via INTRAVENOUS
  Filled 2023-06-19: qty 20

## 2023-06-19 MED ORDER — SUGAMMADEX 100 MG/ML INTRAVENOUS SOLUTION
Freq: Once | INTRAVENOUS | Status: DC | PRN
Start: 2023-06-19 — End: 2023-06-19
  Administered 2023-06-19: 200 mg via INTRAVENOUS

## 2023-06-19 MED ORDER — SODIUM CHLORIDE 0.9 % (FLUSH) INJECTION SYRINGE
2.0000 mL | INJECTION | INTRAMUSCULAR | Status: DC | PRN
Start: 2023-06-19 — End: 2023-06-21

## 2023-06-19 MED ORDER — CEFAZOLIN 1 GRAM SOLUTION FOR INJECTION
INTRAMUSCULAR | Status: AC
Start: 2023-06-19 — End: 2023-06-19
  Filled 2023-06-19: qty 10

## 2023-06-19 MED ORDER — NITROGLYCERIN 50 MCG/ML IV DILUTION - FOR ANES
INJECTION | Freq: Once | INTRAVENOUS | Status: DC | PRN
Start: 2023-06-19 — End: 2023-06-19
  Administered 2023-06-19: 50 ug via INTRAVENOUS

## 2023-06-19 MED ORDER — ACETAMINOPHEN 325 MG TABLET
650.0000 mg | ORAL_TABLET | Freq: Four times a day (QID) | ORAL | Status: DC | PRN
Start: 2023-06-19 — End: 2023-06-22
  Administered 2023-06-21: 650 mg via ORAL
  Filled 2023-06-19: qty 2

## 2023-06-19 MED ORDER — PHENYLEPHRINE 1 MG/10 ML (100 MCG/ML) IN 0.9 % SOD.CHLORIDE IV SYRINGE
INJECTION | INTRAVENOUS | Status: AC
Start: 2023-06-19 — End: 2023-06-19
  Filled 2023-06-19: qty 10

## 2023-06-19 MED ORDER — ROCURONIUM 10 MG/ML INTRAVENOUS SYRINGE WRAPPER
INJECTION | INTRAVENOUS | Status: AC
Start: 2023-06-19 — End: 2023-06-19
  Filled 2023-06-19: qty 5

## 2023-06-19 MED ORDER — FENTANYL (PF) 50 MCG/ML INJECTION SOLUTION
INTRAMUSCULAR | Status: AC
Start: 2023-06-19 — End: 2023-06-19
  Filled 2023-06-19: qty 2

## 2023-06-19 MED ORDER — DEXAMETHASONE SODIUM PHOSPHATE 4 MG/ML INJECTION SOLUTION
INTRAMUSCULAR | Status: AC
Start: 2023-06-19 — End: 2023-06-19
  Filled 2023-06-19: qty 1

## 2023-06-19 MED ORDER — NOREPINEPHRINE 16MG IN NS 250ML INFUSION - FOR ANES
INTRAVENOUS | Status: DC | PRN
Start: 2023-06-19 — End: 2023-06-19
  Administered 2023-06-19: .04 ug/kg/min via INTRAVENOUS
  Administered 2023-06-19: .02 ug/kg/min via INTRAVENOUS
  Administered 2023-06-19: .06 ug/kg/min via INTRAVENOUS
  Administered 2023-06-19: .1 ug/kg/min via INTRAVENOUS
  Administered 2023-06-19: .07 ug/kg/min via INTRAVENOUS
  Administered 2023-06-19: 0 ug/kg/min via INTRAVENOUS
  Administered 2023-06-19: .04 ug/kg/min via INTRAVENOUS
  Administered 2023-06-19: .08 ug/kg/min via INTRAVENOUS

## 2023-06-19 MED ORDER — DEXTROSE 5% IN WATER (D5W) FLUSH BAG - 250 ML
INTRAVENOUS | Status: DC | PRN
Start: 2023-06-19 — End: 2023-06-21

## 2023-06-19 MED ORDER — NOREPINEPHRINE 10 MCG/ML IV DILUTION
Freq: Once | INTRAVENOUS | Status: DC | PRN
Start: 2023-06-19 — End: 2023-06-19
  Administered 2023-06-19: 6.4 ug via INTRAVENOUS
  Administered 2023-06-19: 12.8 ug via INTRAVENOUS
  Administered 2023-06-19 (×3): 6.4 ug via INTRAVENOUS

## 2023-06-19 MED ORDER — SODIUM CHLORIDE 0.9 % (FLUSH) INJECTION SYRINGE
2.0000 mL | INJECTION | INTRAMUSCULAR | Status: DC | PRN
Start: 2023-06-19 — End: 2023-06-19

## 2023-06-19 MED ORDER — HYDROMORPHONE (PF) 0.5 MG/0.5 ML INJECTION SYRINGE
INJECTION | INTRAMUSCULAR | Status: AC
Start: 2023-06-19 — End: 2023-06-19
  Filled 2023-06-19: qty 0.5

## 2023-06-19 MED ORDER — SODIUM CHLORIDE 0.9% FLUSH BAG - 250 ML
INTRAVENOUS | Status: DC | PRN
Start: 2023-06-19 — End: 2023-06-24

## 2023-06-19 MED ORDER — ATROPINE 0.4 MG/ML INJECTION WRAPPER
INTRAMUSCULAR | Status: AC
Start: 2023-06-19 — End: 2023-06-19
  Filled 2023-06-19: qty 1

## 2023-06-19 MED ORDER — HYDROMORPHONE (PF) 0.5 MG/0.5 ML INJECTION SYRINGE
INJECTION | Freq: Once | INTRAMUSCULAR | Status: DC | PRN
Start: 2023-06-19 — End: 2023-06-19
  Administered 2023-06-19 (×2): .5 mg via INTRAVENOUS

## 2023-06-19 MED ORDER — THROMBIN(HUM-PLAS)-FIBRIN-CAL 500 UNIT-80 MG/ML (10ML) TOPICAL SYRINGE
INJECTION | Freq: Once | CUTANEOUS | Status: DC | PRN
Start: 2023-06-19 — End: 2023-06-19
  Filled 2023-06-19: qty 10

## 2023-06-19 MED ORDER — SODIUM CHLORIDE 0.9 % INTRAVENOUS SOLUTION
INTRAVENOUS | Status: DC
Start: 2023-06-19 — End: 2023-06-22
  Administered 2023-06-22: 0 mL via INTRAVENOUS

## 2023-06-19 MED ORDER — CALCIUM CHLORIDE 100 MG/ML (10 %) INTRAVENOUS SYRINGE
INJECTION | INTRAVENOUS | Status: AC
Start: 2023-06-19 — End: 2023-06-19
  Filled 2023-06-19: qty 10

## 2023-06-19 MED ORDER — DEXTROSE 5% IN WATER (D5W) FLUSH BAG - 250 ML
INTRAVENOUS | Status: DC | PRN
Start: 2023-06-19 — End: 2023-06-24

## 2023-06-19 MED ORDER — FENTANYL (PF) 50 MCG/ML INJECTION SOLUTION
INTRAMUSCULAR | Status: AC
Start: 2023-06-19 — End: 2023-06-19
  Filled 2023-06-19: qty 5

## 2023-06-19 MED ORDER — SODIUM CHLORIDE 0.9 % (FLUSH) INJECTION SYRINGE
2.0000 mL | INJECTION | Freq: Three times a day (TID) | INTRAMUSCULAR | Status: DC
Start: 2023-06-19 — End: 2023-06-24
  Administered 2023-06-19 (×2): 0 mL
  Administered 2023-06-20: 6 mL
  Administered 2023-06-20: 0 mL
  Administered 2023-06-20: 2 mL
  Administered 2023-06-21: 5 mL
  Administered 2023-06-21: 4 mL
  Administered 2023-06-21: 0 mL
  Administered 2023-06-22: 2 mL
  Administered 2023-06-22 (×2): 0 mL
  Administered 2023-06-23: 6 mL
  Administered 2023-06-23: 4 mL
  Administered 2023-06-23: 0 mL
  Administered 2023-06-24: 6 mL
  Administered 2023-06-24: 0 mL

## 2023-06-19 MED ORDER — LACTATED RINGERS INTRAVENOUS SOLUTION
INTRAVENOUS | Status: DC
Start: 2023-06-19 — End: 2023-06-19

## 2023-06-19 MED ORDER — ROCURONIUM 10 MG/ML INTRAVENOUS SYRINGE WRAPPER
INJECTION | Freq: Once | INTRAVENOUS | Status: DC | PRN
Start: 2023-06-19 — End: 2023-06-19
  Administered 2023-06-19 (×2): 50 mg via INTRAVENOUS
  Administered 2023-06-19: 100 mg via INTRAVENOUS

## 2023-06-19 MED ORDER — KETAMINE 10 MG/ML INJECTION WRAPPER
INTRAMUSCULAR | Status: AC
Start: 2023-06-19 — End: 2023-06-19
  Filled 2023-06-19: qty 5

## 2023-06-19 MED ORDER — SODIUM CHLORIDE 0.9 % INTRAVENOUS SOLUTION
INTRAVENOUS | Status: DC | PRN
Start: 2023-06-19 — End: 2023-06-19
  Administered 2023-06-19: 0 via INTRAVENOUS

## 2023-06-19 MED ORDER — ONDANSETRON HCL (PF) 4 MG/2 ML INJECTION SOLUTION
INTRAMUSCULAR | Status: AC
Start: 2023-06-19 — End: 2023-06-19
  Filled 2023-06-19: qty 2

## 2023-06-19 MED ORDER — SODIUM CHLORIDE 0.9 % INTRAVENOUS SOLUTION
Freq: Once | INTRAVENOUS | Status: DC | PRN
Start: 2023-06-19 — End: 2023-06-19

## 2023-06-19 MED ORDER — SENNOSIDES 8.6 MG-DOCUSATE SODIUM 50 MG TABLET
1.0000 | ORAL_TABLET | Freq: Two times a day (BID) | ORAL | Status: DC
Start: 2023-06-19 — End: 2023-06-22
  Administered 2023-06-19 (×2): 0 via ORAL
  Administered 2023-06-20 (×2): 1 via ORAL
  Administered 2023-06-21 – 2023-06-22 (×3): 0 via ORAL
  Filled 2023-06-19 (×3): qty 1

## 2023-06-19 MED ORDER — DEXAMETHASONE SODIUM PHOSPHATE 4 MG/ML INJECTION SOLUTION
Freq: Once | INTRAMUSCULAR | Status: DC | PRN
Start: 2023-06-19 — End: 2023-06-19
  Administered 2023-06-19: 8 mg via INTRAVENOUS

## 2023-06-19 MED ORDER — HYDROCODONE 5 MG-ACETAMINOPHEN 325 MG TABLET
1.0000 | ORAL_TABLET | Freq: Four times a day (QID) | ORAL | Status: DC | PRN
Start: 2023-06-19 — End: 2023-06-24
  Administered 2023-06-22 – 2023-06-23 (×2): 1 via ORAL
  Administered 2023-06-24: 0 via ORAL
  Administered 2023-06-24: 1 via ORAL
  Filled 2023-06-19 (×3): qty 1

## 2023-06-19 MED ORDER — HYDROMORPHONE (PF) 0.5 MG/0.5 ML INJECTION SYRINGE
0.2000 mg | INJECTION | INTRAMUSCULAR | Status: DC | PRN
Start: 2023-06-19 — End: 2023-06-19

## 2023-06-19 MED ORDER — ONDANSETRON HCL (PF) 4 MG/2 ML INJECTION SOLUTION
Freq: Once | INTRAMUSCULAR | Status: DC | PRN
Start: 2023-06-19 — End: 2023-06-19
  Administered 2023-06-19 (×2): 4 mg via INTRAVENOUS

## 2023-06-19 MED ORDER — HYDROMORPHONE (PF) 0.5 MG/0.5 ML INJECTION SYRINGE
0.4000 mg | INJECTION | INTRAMUSCULAR | Status: DC | PRN
Start: 2023-06-19 — End: 2023-06-19
  Administered 2023-06-19: 0.4 mg via INTRAVENOUS
  Filled 2023-06-19: qty 0.5

## 2023-06-19 MED ORDER — ALBUMIN, HUMAN 5 % INTRAVENOUS SOLUTION
INTRAVENOUS | Status: DC | PRN
Start: 2023-06-19 — End: 2023-06-19
  Administered 2023-06-19 (×2): 0 via INTRAVENOUS

## 2023-06-19 MED ORDER — LIDOCAINE (PF) 20 MG/ML (2 %) INJECTION SOLUTION
INTRAMUSCULAR | Status: AC
Start: 2023-06-19 — End: 2023-06-19
  Filled 2023-06-19: qty 5

## 2023-06-19 MED ORDER — CALCIUM CHLORIDE 100 MG/ML (10 %) INTRAVENOUS SYRINGE
INJECTION | Freq: Once | INTRAVENOUS | Status: DC | PRN
Start: 2023-06-19 — End: 2023-06-19
  Administered 2023-06-19: 250 mg via INTRAVENOUS

## 2023-06-19 MED ORDER — MIDAZOLAM 1 MG/ML INJECTION SOLUTION
INTRAMUSCULAR | Status: AC
Start: 2023-06-19 — End: 2023-06-19
  Filled 2023-06-19: qty 2

## 2023-06-19 MED ORDER — SODIUM CHLORIDE 0.9 % IRRIGATION SOLUTION
1000.0000 mL | Status: DC | PRN
Start: 2023-06-19 — End: 2023-06-19
  Administered 2023-06-19: 1000 mL

## 2023-06-19 MED ORDER — FENTANYL (PF) 50 MCG/ML INJECTION SOLUTION
Freq: Once | INTRAMUSCULAR | Status: DC | PRN
Start: 2023-06-19 — End: 2023-06-19
  Administered 2023-06-19: 50 ug via INTRAVENOUS
  Administered 2023-06-19 (×2): 25 ug via INTRAVENOUS
  Administered 2023-06-19: 50 ug via INTRAVENOUS
  Administered 2023-06-19 (×2): 25 ug via INTRAVENOUS
  Administered 2023-06-19: 100 ug via INTRAVENOUS
  Administered 2023-06-19: 50 ug via INTRAVENOUS

## 2023-06-19 MED ORDER — FAMOTIDINE 20 MG TABLET
20.0000 mg | ORAL_TABLET | Freq: Two times a day (BID) | ORAL | Status: DC
Start: 2023-06-19 — End: 2023-06-24
  Administered 2023-06-19: 0 mg via ORAL
  Administered 2023-06-20 – 2023-06-24 (×9): 20 mg via ORAL
  Filled 2023-06-19 (×9): qty 1

## 2023-06-19 MED ORDER — MIDAZOLAM (PF) 1 MG/ML INJECTION SOLUTION
Freq: Once | INTRAMUSCULAR | Status: DC | PRN
Start: 2023-06-19 — End: 2023-06-19
  Administered 2023-06-19 (×2): 1 mg via INTRAVENOUS

## 2023-06-19 MED ORDER — PHENYLEPHRINE 1 MG/10 ML (100 MCG/ML) IN 0.9 % SOD.CHLORIDE IV SYRINGE
INJECTION | Freq: Once | INTRAVENOUS | Status: DC | PRN
Start: 2023-06-19 — End: 2023-06-19
  Administered 2023-06-19 (×2): 100 ug via INTRAVENOUS
  Administered 2023-06-19: 200 ug via INTRAVENOUS

## 2023-06-19 MED ORDER — SODIUM CHLORIDE 0.9 % INTRAVENOUS SOLUTION
1000.0000 [IU] | Freq: Once | INTRAVENOUS | Status: DC
Start: 2023-06-19 — End: 2023-06-19
  Filled 2023-06-19: qty 1

## 2023-06-19 MED ORDER — PROPOFOL 10 MG/ML IV BOLUS
INJECTION | Freq: Once | INTRAVENOUS | Status: DC | PRN
Start: 2023-06-19 — End: 2023-06-19
  Administered 2023-06-19: 30 mg via INTRAVENOUS
  Administered 2023-06-19 (×5): 50 mg via INTRAVENOUS
  Administered 2023-06-19: 150 mg via INTRAVENOUS
  Administered 2023-06-19: 20 mg via INTRAVENOUS

## 2023-06-19 MED ORDER — DEXTROSE 5% IN WATER (D5W) FLUSH BAG - 250 ML
INTRAVENOUS | Status: DC | PRN
Start: 2023-06-19 — End: 2023-06-19

## 2023-06-19 MED ORDER — PROPOFOL 10 MG/ML INTRAVENOUS EMULSION
INTRAVENOUS | Status: AC
Start: 2023-06-19 — End: 2023-06-19
  Filled 2023-06-19: qty 20

## 2023-06-19 MED ORDER — SODIUM CHLORIDE 0.9 % (FLUSH) INJECTION SYRINGE
2.0000 mL | INJECTION | INTRAMUSCULAR | Status: DC | PRN
Start: 2023-06-19 — End: 2023-06-24

## 2023-06-19 MED ORDER — EPHEDRINE SULFATE 5 MG/ML INTRAVENOUS SOLUTION
INTRAVENOUS | Status: AC
Start: 2023-06-19 — End: 2023-06-19
  Filled 2023-06-19: qty 10

## 2023-06-19 MED ORDER — SODIUM CHLORIDE 0.9% FLUSH BAG - 250 ML
INTRAVENOUS | Status: DC | PRN
Start: 2023-06-19 — End: 2023-06-21

## 2023-06-19 MED ORDER — HEPARIN (PORCINE) 5,000 UNIT/ML INJECTION SOLUTION
5000.0000 [IU] | Freq: Three times a day (TID) | INTRAMUSCULAR | Status: DC
Start: 2023-06-20 — End: 2023-06-24
  Administered 2023-06-20 – 2023-06-24 (×11): 5000 [IU] via SUBCUTANEOUS
  Filled 2023-06-19 (×11): qty 1

## 2023-06-19 MED ORDER — SODIUM CHLORIDE 0.9% FLUSH BAG - 250 ML
INTRAVENOUS | Status: DC | PRN
Start: 2023-06-19 — End: 2023-06-19

## 2023-06-19 MED ORDER — LIDOCAINE (PF) 100 MG/5 ML (2 %) INTRAVENOUS SYRINGE
INJECTION | Freq: Once | INTRAVENOUS | Status: DC | PRN
Start: 2023-06-19 — End: 2023-06-19
  Administered 2023-06-19: 50 mg via INTRAVENOUS
  Administered 2023-06-19: 100 mg via INTRAVENOUS

## 2023-06-19 MED ORDER — SODIUM CHLORIDE 0.9 % (FLUSH) INJECTION SYRINGE
2.0000 mL | INJECTION | Freq: Three times a day (TID) | INTRAMUSCULAR | Status: DC
Start: 2023-06-19 — End: 2023-06-19

## 2023-06-19 MED ORDER — ROCURONIUM 250MG IN 25ML (10MG/ML) INFUSION - FOR ANES
INTRAVENOUS | Status: DC | PRN
Start: 2023-06-19 — End: 2023-06-19
  Administered 2023-06-19: 3 ug/kg/min via INTRAVENOUS
  Administered 2023-06-19: 5 ug/kg/min via INTRAVENOUS
  Administered 2023-06-19: 0 ug/kg/min via INTRAVENOUS

## 2023-06-19 MED ORDER — WATER FOR IRRIGATION, STERILE SOLUTION
1000.0000 mL | Status: DC | PRN
Start: 2023-06-19 — End: 2023-06-19
  Administered 2023-06-19: 1000 mL

## 2023-06-19 MED ORDER — SUGAMMADEX 100 MG/ML INTRAVENOUS SOLUTION
INTRAVENOUS | Status: AC
Start: 2023-06-19 — End: 2023-06-19
  Filled 2023-06-19: qty 2

## 2023-06-19 MED ORDER — ONDANSETRON HCL (PF) 4 MG/2 ML INJECTION SOLUTION
4.0000 mg | Freq: Four times a day (QID) | INTRAMUSCULAR | Status: DC | PRN
Start: 2023-06-19 — End: 2023-06-19

## 2023-06-19 MED ORDER — LACTATED RINGERS INTRAVENOUS SOLUTION
INTRAVENOUS | Status: DC | PRN
Start: 2023-06-19 — End: 2023-06-19
  Administered 2023-06-19: 0 mL via INTRAVENOUS

## 2023-06-19 MED ORDER — SODIUM CHLORIDE 0.9 % (FLUSH) INJECTION SYRINGE
2.0000 mL | INJECTION | Freq: Three times a day (TID) | INTRAMUSCULAR | Status: DC
Start: 2023-06-19 — End: 2023-06-21
  Administered 2023-06-19 (×3): 0 mL
  Administered 2023-06-20: 2 mL
  Administered 2023-06-20: 6 mL
  Administered 2023-06-20 – 2023-06-21 (×2): 0 mL

## 2023-06-19 SURGICAL SUPPLY — 52 items
ABDOMINAL ~~LOC~~ - RUBY MEMORIAL HOSPITAL (CUSTOM TRAYS & PACK) ×2 IMPLANT
ADH SKNCLS CYNCRLT EXOFIN HVSC STRL TISS LF  DISP 1G (MED SURG SUPPLIES) ×2 IMPLANT
APPL 70% ISPRP 2% CHG 26ML CHLRPRP HI-LT ORNG PREP STRL LF  DISP CLR (MED SURG SUPPLIES) ×2 IMPLANT
ARMBRD POSITION 20X8X2IN DVN FOAM (MED SURG SUPPLIES) ×6 IMPLANT
BLADE SURG CLPR W 37.2MM GP EXIST HNDL GTT IN CHRG .23MM NONST LF  DISP (MED SURG SUPPLIES) ×2 IMPLANT
BLANKET MISTRAL-AIR ADULT LWR BODY 55.9X40.2IN FRC AIR HI VOL BLWR INTUITIVE CONTROL PNL LRG LED (MED SURG SUPPLIES) ×2 IMPLANT
BLANKET MISTRAL-AIR ADULT UPR BODY 79X29.9IN FRC AIR HI VOL BLWR INTUITIVE CONTROL PNL LRG LED (MED SURG SUPPLIES) ×2 IMPLANT
CLIP LRG CHEVRON HRT INTERNAL HORIZON TI LGT TRIANGULATE XSECT WRE STRL DISP LF  ORNG (WOUND CARE SUPPLY) ×2 IMPLANT
CLIP MED CHEVRON INTERNAL WECK HORIZON TI 6 CART LGT TRIANGULATE XSECT HRT WRE (WOUND CARE SUPPLY) ×2 IMPLANT
CLIP MED LRG CHEVRON INTERNAL WECK HORIZON TI 6 CART LGT TRIANGULATE XSECT HRT WRE (SUTURE/WOUND CLOSURE) ×2 IMPLANT
CLIP SM CHEVRON INTERNAL WECK HORIZON TI 6 CART LGT TRIANGULATE XSECT (SUTURE/WOUND CLOSURE) ×8 IMPLANT
CUSTOM ABDOMINAL ~~LOC~~ - RUBY MEMORIAL HOSPITAL (CUSTOM TRAYS & PACK) ×2 IMPLANT
DDISCONTINUED USE 345070 - LOOP VESSEL MINI LF  YW RADOPQ SIL DVN DEV-O-LOOP STRL (PERFUSION/HEART SUPPLIES) IMPLANT
DISCONTINUED USE ITEM 316872 - DRESS SILVER SIL GNTL BRDR ADH ANBCTRL 5X5IN ALVN AG GEL (WOUND CARE SUPPLY) IMPLANT
DRAPE 2 INCS FILM ANTIMIC 33X23IN IOBN STRL SURG (DRAPE/PACKS/SHEETS/OR TOWEL) ×2 IMPLANT
DRAPE FNFLD ABS REINF 77X53IN 43528 PRXM LF  STRL DISP SURG SMS 44X23IN (DRAPE/PACKS/SHEETS/OR TOWEL) ×6 IMPLANT
DRESS ALG 8X4IN ACTCT POLYUR NCRSTLN SILVER ABS ANTIMIC BARRIER POSTOP WTPRF FILM LF  STRL (WOUND CARE SUPPLY) ×2 IMPLANT
DRESS SILVER SIL GNTL BRDR ADH ANBCTRL 5X5IN ALVN AG GEL (WOUND CARE SUPPLY) IMPLANT
DRESS WOUND 13.75X4IN ACTCT POLYUR NCRSTLN SILVER ABS ANTIMIC BARRIER POSTOP WTPRF FILM STRL (WOUND CARE SUPPLY) ×2 IMPLANT
ELECTRODE ESURG BLADE 6.5IN 3/32IN EDGE STRL .2IN DISP INSL STD SHAFT XTD LF (SURGICAL CUTTING SUPPLIES) ×2 IMPLANT
ELECTRODE ESURG BLADE 6.5IN EDGE LF (SURGICAL CUTTING SUPPLIES) ×2 IMPLANT
ELECTRODE PATIENT RTN 9FT VLAB C30- LB RM PHSV ACRL FOAM CORD NONIRRITATE NONSENSITIZE ADH STRP (SURGICAL CUTTING SUPPLIES) IMPLANT
GOWN SURG LRG L3 NONREINFORCE HKLP CLSR SET IN SLEEVE STRL LF  DISP BLU SIRUS SMS 43IN (DRAPE/PACKS/SHEETS/OR TOWEL) ×2 IMPLANT
HANDPC SUCT MEDIVAC YANKAUER BLBS TIP CLR STRL LF  DISP (MED SURG SUPPLIES) ×2 IMPLANT
HEMOSTAT ABS 8X4IN FLXB SHR WV_SRGCL STRL DISP (WOUND CARE SUPPLY) ×4 IMPLANT
KIT RM TURNOVER STPC CUSTOM ALLIED CYSTO (CUSTOM TRAYS & PACK) IMPLANT
LABEL MED EZ PEEL MRKR LF (MED SURG SUPPLIES) ×2 IMPLANT
LOOP VESSEL MINI LF  YW RADOPQ SIL DVN DEV-O-LOOP STRL (PERFUSION/HEART SUPPLIES) IMPLANT
LOOP VESSEL SUPERMAXI ELIP 559X1.3MM LF  BLU 2 PCH RADOPQ SIL STERION DISP STRL (PERFUSION/HEART SUPPLIES) IMPLANT
MARKER SKIN PREP RST RLR LBL REG TIP STRL (MED SURG SUPPLIES) IMPLANT
PACK SURG UNIV SIRUS SPLT II REINF TBL CVR 2 ADH SD DRP STRL 90X50IN 79X35IN LF (CUSTOM TRAYS & PACK) ×2 IMPLANT
PEN SURG MRKNG DVN SKIN DISP FN TIP FLXB RLR CAP LBL GNTN VIOL STRL LF (MED SURG SUPPLIES) ×2 IMPLANT
PENCIL SMOKE MANAGEMENT EDGE BLADE ELECTRODE 10FT (MED SURG SUPPLIES) ×2 IMPLANT
POUCH 11X7IN 2 ADH STRP 2 CMPRT STRDRP INSTR PLASTIC STRL (DRAPE/PACKS/SHEETS/OR TOWEL) ×2 IMPLANT
RELOAD STPLR 2MM 2.5MM 3MM 45MM EGIA TI MED VAS TISS ARTC KNIFE BLADE LGR ANVIL BCKT STRL LF  DISP (SUTURE AIDS) ×4 IMPLANT
RELOAD STPLR 2MM 2.5MM 3MM 60MM EGIA TI MED VAS TISS ARTC KNIFE BLADE LGR ANVIL BCKT STRL LF  DISP (SUTURE AIDS) ×26 IMPLANT
RELOAD STPLR 2MM 45MM EGIA TI THN VAS TISS ARTC KNIFE BLADE LGR ANVIL BCKT STRL LF  DISP GRY (SUTURE AIDS) ×4 IMPLANT
RELOAD STPLR MED CURVE 30MM TRI-STPL 2 VAS STRL LF  TAN (SUTURE AIDS) ×4 IMPLANT
SEALDIVD ESURG 18CM 13.5MM LIGASURE IMPCT 14D 180D 34MM CURVE JAW OVAL SHAFT NANO COAT OPN 36MM VLAB (SURGICAL CUTTING SUPPLIES) IMPLANT
SET .241IN 77IN NONVENT PIERCE PIN LRG BORE TUBE SIGHT CHAMBER GRVTY FLOW IRRG CSCP BLADDER STRL LF (MED SURG SUPPLIES) ×2 IMPLANT
SKNCLS EXOFIN FUS 60CM ADH LI_QUID APPL MCBL BARRIER 2-OCTYL (SUTURE/WOUND CLOSURE) ×2 IMPLANT
SOL WARMING DRAPE 44IN X 44IN RECTANGULAR FRAME (DRAPE/PACKS/SHEETS/OR TOWEL) ×2 IMPLANT
SPONGE LAP 18X18IN STD 4 PLY XRY RF DTBL ABS RFDETECT COTTON STRL LF  DISP (MED SURG SUPPLIES) ×2 IMPLANT
STAPLER INTERNAL 6CMX4MM TI SHORT UNIV THN VAS TISS ARTC HNDL LRG BCKT STRL LF  DISP EGIA ENDOS 12MM (SUTURE AIDS) IMPLANT
STAPLER SKIN 4.1X6.5MM 35 W STPL CART LF  APS U DISP CLR SS PLASTIC (WOUND CARE SUPPLY) ×2 IMPLANT
SYRINGE 60ML LF  STRL LID SFT BULB TIP IRRG TVK (MED SURG SUPPLIES) ×2 IMPLANT
TAPE ADH 3IN 1538-3 BX/4 ROLLS (WOUND CARE SUPPLY) ×4 IMPLANT
TOWEL SURG WHT 25X16IN RFDETECT COTTON RADOPQ RF DTBL STRL LF  DISP (MED SURG SUPPLIES) ×2 IMPLANT
TRAY CATH SNAPSEC SIL 16FR 10ML 1 LAYER FOLEY DRAIN BAG URINE METER LF (MED SURG SUPPLIES) ×2 IMPLANT
TUBING SUCT CLR 20FT 9/32IN MEDIVAC NCDTV M/M CONN STRL LF (MED SURG SUPPLIES) ×2 IMPLANT
UNIT ESURG 5.1IN 5.74MM BIPOLAR SLR LIGHT HMST DISP AQUAMANTYS 30D 3.48MM (SURGICAL CUTTING SUPPLIES) ×2 IMPLANT
WOUND IRRG IRRISEPT DBRD CLNSG 0.05% CHG SYSTEM STRL LF (WOUND CARE SUPPLY) ×2 IMPLANT

## 2023-06-19 NOTE — Nurses Notes (Signed)
 Anesthesia Kessler at bedside to remove central line from Right IJ. Patient resting comfortably. VSS.

## 2023-06-19 NOTE — OR Surgeon (Signed)
 [place]  [facility]   DEPARTMENT OF UROLOGY   OPERATION SUMMARY     PATIENT NAME: Elizabeth [name] NUMBER: [id number]   DATE OF SERVICE: 06/19/2023   DATE OF BIRTH: 1970/09/01    PREOPERATIVE DIAGNOSIS:   1. 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 - indeterminate pulmonary nodules)  CT Multiphase - large left renal mass with renal vein involvement, no evidence of mets  CT Chest - pulmonary nodules questionable for metastatic disease  MRI Renal Vein - appears to extend into IVC  2. Past medical history significant for diabetes, HTN, astrocytoma  3. Past surgical history significant for lap cholecystectomy  4. Blood thinners: None  5. ECOG: 1 - Ambulatory, restricted in strenuous activity, able to carry out work of a light or sedentary nature     POSTOPERATIVE DIAGNOSIS:   Same     PROCEDURES:   1. Exploratory laparotomy   2. Left nephrectomy with partial ureterectomy via an open approach  3. Left adrenalectomy   4. 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 Partial resection of inferior vena cava    SURGEONS:   1.  Mina Alter, MD (Attending)  2.  Davied Estelle, MD (Attending; co-surgeon; assisted with mobilization of bowel and vena cava as well as partial resection of vena cava and removal of renal vein thrombus; no qualified resident was available)  2.  Rogers Clayman, MD (PGY5) and Cleavon Curls, MD (PGY5)     ANESTHESIA:   General    ESTIMATED BLOOD LOSS:   800 mL    FLUIDS:   Per anesthesia    COMPLICATIONS:   None    DRAINS:   16-French Foley catheter     SPECIMENS:   1.  Left kidney containing mass, proximal ureter, and perinephric fat along with left adrenal, left renal vein thrombus, and wall of vena cava    INDICATIONS FOR PROCEDURE:   Elizabeth [name] is a 53 y.o. female with a left renal mass who presents for open left radical nephrectomy. Prior to the procedure today, the patient's history and physical was reviewed. Informed consent was obtained and  all questions were satisfactorily answered. The risks of surgery including pain, blood loss requiring blood transfusion, delayed bleeding, hematoma, lymphatic leak, infection, bowel injury requiring immediate and/or future open surgery and the need for a temporary or permanent bowel diversion, ileus, incisional hernia, potential nerve injury secondary to positioning, injury to surrounding organs, and complications from anesthesia (heart attack, stroke, blood clot, pulmonary embolism, prolonged mechanical ventilation, and possibly death) were discussed. There is also a risk that the cancer may return and despite aggressive treatment, may be incurable leading to death. The patient acknowledged and communicated understanding of these risks and wished to proceed with the procedure.     OPERATIVE FINDINGS:  1.  The left kidney, tumor, adrenal gland, proximal ureter, and perinephric fat were removed en bloc without violation or gross spillage of tumor.  There was tumor thrombus extending from the left renal vein just into the vena cava, after mobilization of the vena cava and stapling of the left renal artery we were able to retract the thrombus into the left renal vein and staple across the vena cava longitudinally achieving grossly negative margins.  2.  There were no immediate complications  3.  There was no apparent injury to the spleen, pancreas, large or small bowel, or surrounding structures.    DESCRIPTION OF THE  PROCEDURE:    The patient was transferred to the operating room and general anesthesia was administered. Appropriate peripheral IVs as well as a central line and arterial line were placed by the anesthesia team.  The patient had previously had epidural placed.  The patient was placed in the supine position, stabilized to the table, and pressure points were appropriately padded. The abdomen was prepped and draped in normal sterile fashion. A 16-French Foley catheter was placed to gravity drainage. An  orogastric tube was placed to decompress the stomach and bowel. A modified Makuuchi was made with a knife blade.  Subcutaneous tissues and fascia were divided with cautery, and the peritoneal cavity was sharply entered.  A retractor was placed.  The patient's mass was easily palpated and was mobile. We began mobilizing the left colon and splenic flexure medially. Once sufficiently mobilized, it was packed towards the right upper and right lateral quadrant and held in place with the retractor blades. We identified the gonadal vein inferiorly and divided it just proximal to the ovary using a 45 mm Endo-GIA gold staple load. It was then dissected up to the left renal vein, which was skeletonized anteriorly. We established a plane above the psoas muscle fascia and beneath the lower pole. The lower pole vessels within perinephric fat along with the ureter were ligated and divided.  Collateral vessels, particularly along the medial aspect of the kidney lateral to the aorta, were ligated and divided. We continued to dissect up towards the left renal hilum.  The upper pole of the kidney was densely adherent to the retroperitoneum in the spleen.  We used a LigaSure device to free it as well as multiple Endo-GIA 60 mm gold staple loads.  After dividing, we are able to palpate both the left renal vein, which contained thrombus, and the left renal artery.  The renal artery was then ligated and divided using endovascular stapler loads. The splenorenal ligament had been carefully released during the course of dissection. The pancreas was carefully retracted cephalad. We completely mobilized the upper pole of the kidney and then released the lateral perirenal attachments until the entire kidney, mass, proximal ureter, and collateral vessels were released aside from the vein.  Our attention was then turned to the renal vein and vena cava.  We repositioned the bowel and located the read of the small bowel mesentery.  We performed  medial dissection along the root of the mesentery and identified the vena cava as well as the left renal vein.  However, this exposure was suboptimal.  Therefore, we mobilized the liver with the help of hepatobiliary surgery and the hepatic flexure and ascending colon medially.  Following: Mobilization, the cava was widely exposed.  We were able to pass a umbilical tape around the left renal vein and also identified the right gonadal vein in the right renal vein.  The thrombus was able to be easily moved into the renal vein out of the vena cava.  The medial edge of the vena cava was divided using a 30 mm Endo-GIA stapler load.  The vein was dissected distally and the specimen was freed. The specimen was sent to Pathology. There was no gross disruption of tumor. Following a radical nephrectomy, we saw no evidence for bleeding or lymphatic leak.  The renal fossa was irrigated.  We positioned the colon back into the left renal fossa.  We reoriented the bowel in its normal anatomic location. Omentum was positioned over the bowel.  We turned our attention to  closing the patient's abdomen. The modified Makuuchi incision was closed in the midline with a running No. 1 PDS suture and laterally in two layers with running No. 1 PDS suture.  We did use intermittent 0 Polysorb sutures as internal retention sutures in the midline.  Subcutaneous tissues were irrigated and approximated with 2-0 Vicryl.  The skin was closed with sterile 4-0 Monocryl subcuticular suture, and Exofin skin glue was applied.  At the conclusion of the case, all sponge and needle counts were correct.     PLAN:    The patient tolerated the procedure well, was awakened from anesthesia and taken to recovery room in stable condition. The patient will be admitted to the hospital step-down unit after meeting anesthesia criteria.  We will maintain arterial line, Foley, and epidural.      Rogers Clayman, MD     I was present and participated in all aspects of the  patient's case.   I reviewed the resident's note.   I agree with the findings and plan of care as documented in the resident's note.   Any exceptions/additions are edited/noted.    Mina Alter, MD  Assistant Professor, Surgeon   Division of Urologic Oncology  Department of Urology   [place]  [facility] Medicine

## 2023-06-19 NOTE — Progress Notes (Signed)
 [facility]  ACUTE PAIN SERVICE PROGRESS NOTE    DATE OF SERVICE:  06/19/2023  DIAGNOSIS:  Acute post-operative pain, s/p NEPHRECTOMY (Left)  ADRENALECTOMY (Left: Kidney)      BLOCK PERFORMED:    Thoracic Epidural  Catheter placed 06/18/2023. Today is post-procedure day 0.    MEDICATION:  Ropivacaine 0.2% + Fentanyl 2mcg/ml @ 6cc/hr    PLAN:   - Increase Epidural infusion rate, from 2 - 6 mL/hr post op  - Oral medications per primary service  - Please do not use any additional local anesthetic or apply a lidocaine  patch.  - Anticoagulation instructions - OK for subq heparin, will need to be held for 6 hrs prior to contacting RAP team to remove the catheter.   - Please call [phone] w/questions or concerns regarding Elizabeth [name]'s regional anesthesia management    Catheter Site Assessment:  Clean/dry/intact    PE:   Neuro: somnolent and arousable  Resp: non-labored breathing    SUBJECTIVE:  Patient appearing comfortable post-operatively.    VITALS:   Temperature: 36.2 C (97.2 F)  Heart Rate: 77  BP (Non-Invasive): 127/68  Respiratory Rate: 20  SpO2: 94 %    Analgesics (last 24 hours)       Date/Time Action Medication Dose Rate    06/19/23 1214 Rate Change    fentaNYL 2 mcg/mL + ROPivacaine 0.2% in NS 250mL (tot vol) epidural infusion  6 mL/hr    06/19/23 1107 Rate Change    fentaNYL 2 mcg/mL + ROPivacaine 0.2% in NS 250mL (tot vol) epidural infusion 3 mL/hr 3 mL/hr    06/19/23 0856 Rate Change    fentaNYL 2 mcg/mL + ROPivacaine 0.2% in NS 250mL (tot vol) epidural infusion 3 mL/hr 3 mL/hr    06/19/23 0831 Rate Change    fentaNYL 2 mcg/mL + ROPivacaine 0.2% in NS 250mL (tot vol) epidural infusion 2 mL/hr 2 mL/hr    06/19/23 0813 Rate Change    fentaNYL 2 mcg/mL + ROPivacaine 0.2% in NS 250mL (tot vol) epidural infusion 4 mL/hr 4 mL/hr    06/19/23 0710 Rate Change    fentaNYL 2 mcg/mL + ROPivacaine 0.2% in NS 250mL (tot vol) epidural infusion 2 mL/hr 2 mL/hr    06/18/23 2017 Given    acetaminophen  (TYLENOL )  tablet 650 mg     06/18/23 1615 Given    acetaminophen  (TYLENOL ) tablet 650 mg     06/18/23 1354 New Bag/New Syringe    fentaNYL 2 mcg/mL + ROPivacaine 0.2% in NS 250mL (tot vol) epidural infusion 2 mL/hr 2 mL/hr            Anticoagulants/Antiplatelets (last 24 hours)       Date/Time Action Medication Dose    06/19/23 0511 Given    heparin 5,000 unit/mL injection 5,000 Units    06/18/23 2017 Given    heparin 5,000 unit/mL injection 5,000 Units            Liver/Pancreas Enzyme Results Liver Function Results   No results found for this or any previous visit (from the past 30 hours). No results found for this or any previous visit (from the past 30 hours).          BMP Results Other Chemistries Results   Results for orders placed or performed during the hospital encounter of 06/15/23 (from the past 30 hours)   BASIC METABOLIC PANEL    Collection Time: 06/19/23  4:12 AM   Result Value    SODIUM 140  POTASSIUM 3.4 (L)    CHLORIDE 108    CO2 TOTAL 24    BUN 6 (L)    CREATININE 0.66    Recent Results (from the past 30 hours)   MAGNESIUM    Collection Time: 06/19/23  4:12 AM   Result Value    MAGNESIUM 1.9   PHOSPHORUS    Collection Time: 06/19/23  4:12 AM   Result Value    PHOSPHORUS 4.1               [name], MD 06/19/2023, 12:50    Regional Anesthesia Service  [place] Dept of Anesthesiology       I saw and examined the patient.  I reviewed the resident's note.  I agree with the findings and plan of care as documented in the resident's note.  Any exceptions/additions are edited/noted.    Margurette Shillings, MD

## 2023-06-19 NOTE — Nurses Notes (Signed)
 Pt BP via ART line reading with a MAP below 65. Noninvasive BP soft but MAP greater than 65. ART line zeroed ans still reading low. Urology paged and notified of pt's BP and the pt still being too lethargic post op to take any PO medications at this time. Urology aware and stated if pt's LOC improves later in the shift the pt's PO meds can be attempted and as long as the pt's noninvasive was within limits they were not concerned at this time. Pt care continues.

## 2023-06-19 NOTE — Nurses Notes (Signed)
 Patient arrived to unit via bed with RN and central transport. Arterial line and foley patent. Skin check completed with RN x2. Call bell within reach, bed alarm active. Plan of care of care ongoing.

## 2023-06-19 NOTE — Anesthesia Procedure Notes (Signed)
 Elizabeth [name]    Arterial Line Procedure    Pt location: In OR  Consent:     Consent given by:  Patient    Risks discussed:  Bleeding, infection and pain  Universal protocol:     Procedure explained and questions answered to patient or proxy's satisfaction: yes      Immediately prior to procedure a time out was called: yes      Patient identity confirmed:  Verbally with patient, hospital-assigned identification number and arm band  Pre-procedure details:     Preparation: Preprocedure hand washing was performed; sterile field was maintained         Skin Prep used: Chlorhexidine gluconate  Anesthesia (see MAR for exact dosages):     Anesthesia method:  Under general anesthesia    A 20 G Catheter type: Arrow 1 and 1/2 inch in length,  Placed on the right  radial artery  using anatomical landmarks, guidewire, Seldinger and palpation With  number of attempts:2.Secured with: transparent dressing   MEDICATIONS:     Post-procedure details:    Patient tolerance of procedure:  Tolerated well, no immediate complications blood withdrawn easily, flushes easily, good waveform and Waveform appropriate  Complications:none  Performed By:  Performing provider: Carrol Bondar , Samson Croak, CRNA Authorizing provider: Elijio Guadeloupe, DO

## 2023-06-19 NOTE — Progress Notes (Signed)
 [place]  [place] [facility]  UROLOGY PROGRESS NOTE   Patient: Elizabeth [name], Elizabeth [name], 53 y.o. female  Date of Admission:  06/15/2023  Date of Birth:  13-Aug-1970  Date of Service:  06/19/2023     ASSESSMENT:    53 y.o. female:    1. 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 Past medical history significant for diabetes, HTN, astrocytoma  3. Past surgical history significant for lap cholecystectomy  4. Blood thinners: None  5. ECOG: 1 - Ambulatory, restricted in strenuous activity, able to carry out work of a light or sedentary nature      PLAN:   - Plan for OR today for left nephrectomy with caval thrombectomy  - consent obtained and type and cross x6 units PRBC  - NPO   - RAP consult - epidural placed yesterday    Diet: NPO   Anticoagulation: SQH   Pain regimen: Tylenol , Robaxin , Epidural    Bowel regimen: Miralax    Foley catheter: N/a   Drain: N/a   Antibiotics: N/a   IV fluids: N/a   Microbiology: N/a   Imaging: N/a   PT/OT recs: Pending surgery     DISPOSITION: Floor status, anticipate discharge pending surgery and recovery     SUBJECTIVE:   No overnight events. No nausea, vomiting. No fevers. Feels ready for surgery today.      OBJECTIVE:   Temperature: 36.9 C (98.5 F)  Heart Rate: 88  BP (Non-Invasive): (!) 129/59  Respiratory Rate: 18  SpO2: 96 %  PHYSICAL EXAM:  General: 53 y.o. female not in acute distress.    Skin: Warm and dry.     Eyes: Eyes are clear.    Pulmonary: Respiratory effort is unlabored.     Psychiatric: Patient is alert, appropriate mood.    Cardiovascular: Palpable peripheral pulses.    Neurologic: CN II-XII grossly intact, A&O x 3     Gastrointestinal: The abdomen is soft, nontender and non-distended.    Genitourinary: Deferred        ATTESTATION:    Grayson Leaf, MD  Urology Resident, PGY3  06/19/2023 06:09     I saw and examined the patient.  I reviewed the resident's note.   I agree with the findings and plan of care as documented in the resident's note.   Any exceptions/additions are edited/noted.    Mina Alter, MD  Assistant Professor, Surgeon   Division of Urologic Oncology  Department of Urology   [place]  [facility] Medicine

## 2023-06-19 NOTE — Anesthesia Transfer of Care (Signed)
 ANESTHESIA TRANSFER OF CARE   Elizabeth [name] is a 53 y.o. 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    CPAP (continuous positive airway pressure) dependence     CVA (cerebrovascular accident)     mini during brain surgery per pt    Diabetes mellitus, type 2     Disorder of thyroid     Epileptic seizure (CMS [facility])     Essential hypertension     Hx of breast cancer     Sleep apnea       Allergy History as of 06/19/23       ACE INHIBITORS         Noted Status Severity Type Reaction    04/05/21 1524 Moishe Angel, [place] 09/10/13 Active       Comments: Other reaction(s): Angioedema                   I completed my transfer of care / handoff to the receiving personnel during which we discussed:  Access, Airway, All key/critical aspects of case discussed, Analgesia, Antibiotics, Expectation of post procedure, Fluids/Product, Gave opportunity for questions and acknowledgement of understanding, Labs and PMHx      Post Location: PACU                        Additional Info:Pt to PACU. Transported on 6L O2 via FM. Report to PACU RN. VSS.                                       Last OR Temp: Temperature: 36.1 C (97 F)  ABG:  PH (ARTERIAL)   Date Value Ref Range Status   06/19/2023 7.37 7.35 - 7.45 Final     PH (T)   Date Value Ref Range Status   06/19/2023 7.31 (L) 7.32 - 7.43 Final     PCO2 (ARTERIAL)   Date Value Ref Range Status   06/19/2023 42 35 - 45 mm/Hg Final     PCO2 (VENOUS)   Date Value Ref Range Status   06/19/2023 39 (L) 41 - 51 mm/Hg Final     PO2 (ARTERIAL)   Date Value Ref Range Status   06/19/2023 154 (H) 83 - 108 mm/Hg Final     PO2 (VENOUS)   Date Value Ref Range Status   06/19/2023 131 35 - 50 mm/Hg Final     SODIUM   Date  Value Ref Range Status   06/19/2023 137 136 - 145 mmol/L Final     POTASSIUM   Date Value Ref Range Status   06/19/2023 3.4 (L) 3.5 - 5.1 mmol/L Final     KETONES   Date Value Ref Range Status   04/15/2023 Negative Negative mg/dL Final     WHOLE BLOOD POTASSIUM   Date Value Ref Range Status   06/19/2023 3.4 (L) 3.5 - 5.1 mmol/L Final     CHLORIDE   Date Value Ref Range Status   06/19/2023 109 (H) 98 - 107 mmol/L Final  CALCIUM   Date Value Ref Range Status   06/19/2023 8.3 (L) 8.6 - 10.2 mg/dL Final     Comment:     Gadolinium-containing contrast can interfere with calcium measurement.       IONIZED CALCIUM   Date Value Ref Range Status   06/19/2023 1.09 (L) 1.15 - 1.33 mmol/L Final     LACTATE   Date Value Ref Range Status   06/19/2023 0.8 <=1.9 mmol/L Final     HEMOGLOBIN   Date Value Ref Range Status   06/19/2023 10.0 (L) 12.0 - 18.0 g/dL Final     OXYHEMOGLOBIN   Date Value Ref Range Status   06/19/2023 96.4 40.0 - 80.0 % Final     CARBOXYHEMOGLOBIN   Date Value Ref Range Status   06/19/2023 1.4 <=3.0 % Final     MET-HEMOGLOBIN   Date Value Ref Range Status   06/19/2023 1.1 <=1.5 % Final     BASE DEFICIT   Date Value Ref Range Status   06/19/2023 6.2 (H) 0.0 - 3.0 mmol/L Final   06/19/2023 1.0 0.0 - 3.0 mmol/L Final     BICARBONATE (ARTERIAL)   Date Value Ref Range Status   06/19/2023 24.2 21.0 - 28.0 mmol/L Final     BICARBONATE (VENOUS)   Date Value Ref Range Status   06/19/2023 20.1 (L) 22.0 - 29.0 mmol/L Final     TEMPERATURE, COMP   Date Value Ref Range Status   06/19/2023 37.0 15.0 - 40.0 C Final     %FIO2 (VENOUS)   Date Value Ref Range Status   06/19/2023 61.0 % Final     Airway:* No LDAs found *  Blood pressure (!) 103/56, pulse 79, temperature 36.1 C (97 F), resp. rate 18, height 1.575 m (5\' 2" ), weight 89.5 kg (197 lb 5 oz), SpO2 95%.

## 2023-06-19 NOTE — Anesthesia Postprocedure Evaluation (Signed)
 Anesthesia Post Op Evaluation    Patient: [facility]  Procedure(s) with comments:  NEPHRECTOMY - Renal vein thrombosis, partial resection of the vena cava  ADRENALECTOMY    Last Vitals:Temperature: 36.2 C (97.2 F) (06/19/23 1230)  Heart Rate: 77 (06/19/23 1230)  BP (Non-Invasive): 109/61 (06/19/23 1230)  Respiratory Rate: 19 (06/19/23 1230)  SpO2: 93 % (06/19/23 1230)    No notable events documented.    Patient is sufficiently recovered from the effects of anesthesia to participate in the evaluation and has returned to their pre-procedure level.  Patient location during evaluation: PACU       Patient participation: complete - patient participated  Level of consciousness: awake and alert and responsive to verbal stimuli    Pain management: adequate  Airway patency: patent    Anesthetic complications: no  Cardiovascular status: acceptable  Respiratory status: acceptable  Hydration status: acceptable  Patient post-procedure temperature: Pt Normothermic   PONV Status: Absent

## 2023-06-19 NOTE — Procedures (Signed)
 [place]  [facility] Line Removal    Procedure Date: 06/19/2023 Time: 13:14   Procedure: Central Line Removal  Diagnosis: Kidney mass    Description: A double lumen central line was removed from the R IJ vein with patient in trendelenburg position after sutures had been removed and alternate IV access had been obtained.  Pressure was then held for 15 minutes and the area was covered with gauze and pressure dressing.  The patient tolerated this procedure without complications.     Earma Gloss, MD 06/19/2023, 13:14    I reviewed the resident's note.  I agree with the findings and plan of care as documented in the resident's note.  Any exceptions/additions are edited/noted.    Asa Lauth, DO

## 2023-06-20 ENCOUNTER — Ambulatory Visit (INDEPENDENT_AMBULATORY_CARE_PROVIDER_SITE_OTHER): Payer: Self-pay | Admitting: Student in an Organized Health Care Education/Training Program

## 2023-06-20 DIAGNOSIS — R9431 Abnormal electrocardiogram [ECG] [EKG]: Secondary | ICD-10-CM

## 2023-06-20 DIAGNOSIS — Z9889 Other specified postprocedural states: Secondary | ICD-10-CM

## 2023-06-20 LAB — CBC WITH DIFF
BASOPHIL #: <0.1 10*3/uL (ref <=0.20)
BASOPHIL %: 0.4 %
EOSINOPHIL #: <0.1 10*3/uL (ref <=0.50)
EOSINOPHIL %: 0 %
HCT: 30.5 % — ABNORMAL LOW (ref 34.8–46.0)
HGB: 10.7 g/dL — ABNORMAL LOW (ref 11.5–16.0)
IMMATURE GRANULOCYTE #: <0.1 10*3/uL (ref <0.10)
IMMATURE GRANULOCYTE %: 0.4 % (ref 0.0–1.0)
LYMPHOCYTE #: 1.85 10*3/uL (ref 1.00–4.80)
LYMPHOCYTE %: 16.5 %
MCH: 32.7 pg — ABNORMAL HIGH (ref 26.0–32.0)
MCHC: 35.1 g/dL (ref 31.0–35.5)
MCV: 93.3 fL (ref 78.0–100.0)
MONOCYTE #: 0.84 10*3/uL (ref 0.20–1.10)
MONOCYTE %: 7.5 %
MPV: 9.1 fL (ref 8.7–12.5)
NEUTROPHIL #: 8.42 10*3/uL — ABNORMAL HIGH (ref 1.50–7.70)
NEUTROPHIL %: 75.2 %
PLATELETS: 180 10*3/uL (ref 150–400)
RBC: 3.27 10*6/uL — ABNORMAL LOW (ref 3.85–5.22)
RDW-CV: 13.3 % (ref 11.5–15.5)
WBC: 11.2 10*3/uL — ABNORMAL HIGH (ref 3.7–11.0)

## 2023-06-20 LAB — BASIC METABOLIC PANEL
ANION GAP: 9 mmol/L (ref 4–13)
BUN/CREA RATIO: 10 (ref 6–22)
BUN: 8 mg/dL (ref 8–25)
CALCIUM: 7.6 mg/dL — ABNORMAL LOW (ref 8.6–10.2)
CHLORIDE: 109 mmol/L (ref 96–111)
CO2 TOTAL: 20 mmol/L — ABNORMAL LOW (ref 22–30)
CREATININE: 0.81 mg/dL (ref 0.60–1.05)
ESTIMATED GFR - FEMALE: 86 mL/min/BSA (ref >=60)
GLUCOSE: 122 mg/dL (ref 65–125)
POTASSIUM: 3.3 mmol/L — ABNORMAL LOW (ref 3.5–5.1)
SODIUM: 138 mmol/L (ref 136–145)

## 2023-06-20 LAB — ECG 12-LEAD
Atrial Rate: 74 {beats}/min
Calculated P Axis: 52 degrees
Calculated R Axis: 82 degrees
Calculated T Axis: 25 degrees
PR Interval: 166 ms
QRS Duration: 80 ms
QT Interval: 344 ms
QTC Calculation: 381 ms
Ventricular rate: 74 {beats}/min

## 2023-06-20 LAB — CROSSMATCH RED CELLS - UNITS
UNIT DIVISION: 0
UNIT DIVISION: 0
UNIT DIVISION: 0
UNIT DIVISION: 0

## 2023-06-20 LAB — TYPE AND SCREEN
ABO/RH(D): O POS
ANTIBODY SCREEN: NEGATIVE
UNITS ORDERED: 6

## 2023-06-20 LAB — MAGNESIUM: MAGNESIUM: 1.5 mg/dL — ABNORMAL LOW (ref 1.8–2.6)

## 2023-06-20 LAB — PHOSPHORUS: PHOSPHORUS: 3.7 mg/dL (ref 2.4–4.7)

## 2023-06-20 MED ORDER — LACTATED RINGERS IV BOLUS
500.0000 mL | INJECTION | Freq: Once | Status: AC
Start: 2023-06-20 — End: 2023-06-20
  Administered 2023-06-20: 0 mL via INTRAVENOUS
  Administered 2023-06-20: 500 mL via INTRAVENOUS

## 2023-06-20 MED ORDER — MAGNESIUM OXIDE 400 MG (241.3 MG MAGNESIUM) TABLET
400.0000 mg | ORAL_TABLET | Freq: Two times a day (BID) | ORAL | Status: AC
Start: 2023-06-20 — End: 2023-06-20
  Administered 2023-06-20 (×2): 400 mg via ORAL
  Filled 2023-06-20 (×2): qty 1

## 2023-06-20 MED ORDER — BISACODYL 10 MG RECTAL SUPPOSITORY
10.0000 mg | Freq: Once | RECTAL | Status: AC
Start: 2023-06-20 — End: 2023-06-20
  Administered 2023-06-20: 10 mg via RECTAL
  Filled 2023-06-20: qty 1

## 2023-06-20 MED ORDER — POTASSIUM CHLORIDE ER 20 MEQ TABLET,EXTENDED RELEASE(PART/CRYST)
20.0000 meq | ORAL_TABLET | Freq: Once | ORAL | Status: AC
Start: 2023-06-20 — End: 2023-06-20
  Administered 2023-06-20: 20 meq via ORAL
  Filled 2023-06-20: qty 1

## 2023-06-20 NOTE — Progress Notes (Signed)
 [place]  [place] [facility]  UROLOGY PROGRESS NOTE   Patient: Elizabeth [name], Elizabeth [name], 53 y.o. female  Date of Admission:  06/15/2023  Date of Birth:  [birth year]  Date of Service:  06/20/2023     ASSESSMENT:    53 y.o. female:    1. 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 Past medical history significant for diabetes, HTN, astrocytoma  3. Past surgical history significant for lap cholecystectomy  4. Blood thinners: None  5. ECOG: 1 - Ambulatory, restricted in strenuous activity, able to carry out work of a light or sedentary nature      PLAN:   - Continue clear liquid diet  - Dulcolax suppository   - Appreciate RAP recommendations, will maintain epidural pain catheter today    Diet: Advance to clear liquid    Anticoagulation: SQH   Pain regimen: Tylenol , Robaxin , Norco, Dilaudid , Epidural   Bowel regimen: Miralax , Dulcolax suppository    Foley catheter: Maintain with epidural   Drain: N/a   Antibiotics: N/a   IV fluids: Continue at NS 125 mL/hr   Microbiology: N/a   Imaging: N/a   PT/OT recs: Pending     DISPOSITION: Floor status, anticipate discharge in 2-3 days pending pain control, tolerance of regular diet and return of bowel function      SUBJECTIVE:   NAEON. She is sitting up in bed this morning. She is having some abdominal pain related to surgery. Denies any nausea/vomiting however she has not had any clear liquids yet. She is not yet passing gas.     OBJECTIVE:   Temperature: 36.9 C (98.4 F)  Heart Rate: 88  BP (Non-Invasive): (!) 124/59  Respiratory Rate: 17  SpO2: 97 %  PHYSICAL EXAM:  General: 52 y.o. female not in acute distress.    Skin: Warm and dry.     Eyes: Eyes are clear.    Pulmonary: Respiratory effort is unlabored.     Psychiatric: Patient  is alert, appropriate mood.    Cardiovascular: Palpable peripheral pulses.    Neurologic: CN II-XII grossly intact, A&O x 3     Gastrointestinal: Soft, appropriately tender, non-distended. Mepilex dressing over left Makuuchi incision non-saturated   Genitourinary: Foley catheter draining clear yellow urine        ATTESTATION:   Marcial Setting, DO    I was immediately available for this patient encounter.  I reviewed the resident's note.   I agree with the findings and plan of care as documented in the resident's note.   Any exceptions/additions are edited/noted.    Mina Alter, MD  Assistant Professor, Surgeon   Division of Urologic Oncology  Department of Urology   [place]  [facility]

## 2023-06-20 NOTE — Nurses Notes (Signed)
 Urology paged - "[id number] - ABP 80s/50s MAP 63, NBP 90/40's MAPs high 50's #[id number]" Orders obtained for LR bolus. Pt still lethargic, service notified. Fentanyl  epidural infusion rate decreased to 12ml/hr per anesthesiology resident at bedside.     Domenick Friedlander, RN

## 2023-06-20 NOTE — Progress Notes (Signed)
 [facility]  ACUTE PAIN SERVICE PROGRESS NOTE    DATE OF SERVICE:  06/20/2023  DIAGNOSIS:  Acute post-operative pain, s/p NEPHRECTOMY (Left)  ADRENALECTOMY (Left: Kidney)      BLOCK PERFORMED:    Thoracic Epidural  Catheter placed 06/18/2023. Today is post-procedure day 1.    MEDICATION:  Ropivacaine  0.2% + Fentanyl  30mcg/ml @ 8cc/hr    PLAN:   - Increase Epidural infusion rate to 65mL/hr   - Oral medications per primary service  - Please do not use any additional local anesthetic or apply a lidocaine  patch.  - Anticoagulation instructions - OK for subq heparin , will need to be held for 6 hrs prior to contacting RAP team to remove the catheter.   - Please call [phone] w/questions or concerns regarding Elizabeth [name]'s regional anesthesia management    Catheter Site Assessment:  Clean/dry/intact    PE:   Neuro: somnolent and arousable  Resp: non-labored breathing    SUBJECTIVE:  Patient appearing comfortable post-operatively.    VITALS:   Temperature: 36.9 C (98.4 F)  Heart Rate: 81  BP (Non-Invasive): 105/85  Respiratory Rate: 18  SpO2: 96 %    Analgesics (last 24 hours)       Date/Time Action Medication Dose Rate    06/19/23 1214 Rate Change    fentaNYL  2 mcg/mL + ROPivacaine  0.2% in NS 250mL (tot vol) epidural infusion  6 mL/hr    06/19/23 1107 Rate Change    fentaNYL  2 mcg/mL + ROPivacaine  0.2% in NS 250mL (tot vol) epidural infusion 3 mL/hr 3 mL/hr    06/19/23 0856 Rate Change    fentaNYL  2 mcg/mL + ROPivacaine  0.2% in NS 250mL (tot vol) epidural infusion 3 mL/hr 3 mL/hr    06/19/23 0831 Rate Change    fentaNYL  2 mcg/mL + ROPivacaine  0.2% in NS 250mL (tot vol) epidural infusion 2 mL/hr 2 mL/hr    06/19/23 0813 Rate Change    fentaNYL  2 mcg/mL + ROPivacaine  0.2% in NS 250mL (tot vol) epidural infusion 4 mL/hr 4 mL/hr    06/19/23 0710 Rate Change    fentaNYL  2 mcg/mL + ROPivacaine  0.2% in NS 250mL (tot vol) epidural infusion 2 mL/hr 2 mL/hr    06/18/23 2017 Given    acetaminophen  (TYLENOL ) tablet 650 mg      06/18/23 1615 Given    acetaminophen  (TYLENOL ) tablet 650 mg     06/18/23 1354 New Bag/New Syringe    fentaNYL  2 mcg/mL + ROPivacaine  0.2% in NS 250mL (tot vol) epidural infusion 2 mL/hr 2 mL/hr            Anticoagulants/Antiplatelets (last 24 hours)       Date/Time Action Medication Dose    06/20/23 1441 Given    heparin  5,000 unit/mL injection 5,000 Units    06/20/23 0608 Given    heparin  5,000 unit/mL injection 5,000 Units            Liver/Pancreas Enzyme Results Liver Function Results   No results found for this or any previous visit (from the past 30 hours). No results found for this or any previous visit (from the past 30 hours).          BMP Results Other Chemistries Results   Results for orders placed or performed during the hospital encounter of 06/15/23 (from the past 30 hours)   BASIC METABOLIC PANEL    Collection Time: 06/20/23  2:58 AM   Result Value    SODIUM 138    POTASSIUM 3.3 (L)  CHLORIDE 109    CO2 TOTAL 20 (L)    BUN 8    CREATININE 0.81    Recent Results (from the past 30 hours)   MAGNESIUM    Collection Time: 06/20/23  2:58 AM   Result Value    MAGNESIUM 1.5 (L)   PHOSPHORUS    Collection Time: 06/20/23  2:58 AM   Result Value    PHOSPHORUS 3.7               Elizabeth Doe, MD PGY-4/CA-3  Department  Of Anesthesiology  Pager # [phone]  06/20/2023, 15:00

## 2023-06-20 NOTE — Care Management Notes (Signed)
 [facility]  Care Management Note    Patient Name: Elizabeth [name]  Date of Birth: 1970-10-10  Sex: female  Date/Time of Admission: 06/15/2023  7:10 AM  Room/Bed: 806/A  Payor: BLUE CROSS BLUE [employer]IELD / Plan: [place] [employer] PPO / Product Type: PPO /    LOS: 5 days   Primary Care Providers:  [facility] ([facility])    Admitting Diagnosis:  Kidney mass [N28.89]    Assessment:      06/20/23 1225   Assessment Details   Assessment Type Continued Assessment   Date of Care Management Update 06/20/23   Date of Next DCP Update 06/23/23   Care Management Plan   Discharge Planning Status plan in progress   Projected Discharge Date 06/26/23   CM will evaluate for rehabilitation potential yes   Discharge Needs Assessment   Discharge Facility/Level of Care Needs Home with Home Health (code 6)   Transportation Available family or friend will provide     Patient continues to be medically managed. Labs, I&O, vitals and PT/OT. PT/OT recommendations pending. Family will transport her upon discharge. CCC will continue to follow.     Discharge Plan:  Home with Home Health (code 6)      The patient will continue to be evaluated for developing discharge needs.     Case Manager: Olden [place], RN  Phone: [phone]

## 2023-06-20 NOTE — Care Plan (Signed)
 [facility]  Rehabilitation Services  Occupational Therapy Initial Evaluation    Patient Name: Elizabeth [name]  Date of Birth: [birth year]  Height: Height: 157.5 cm (5\' 2" )  Weight: Weight: 89.5 kg (197 lb 5 oz)  Room/Bed: 806/A  Payor: BLUE CROSS BLUE [employer]IELD / Plan: [place] [employer] PPO / Product Type: PPO /     Assessment:   Pt tolerated OT evaluation fair this date. Pt presents with functional deficits in strength, balance, endurance, and pain decreasing pt's functional IND from reported PLOF. Pt compensating with MAX A to complete bed mobility and unable to attempt OOB activity secondary to pain. OT recommending continued intervention in acute rehab setting.      Discharge Needs:   Equipment Recommendation: to be determined  Discharge Disposition: inpatient rehabilitation facility    JUSTIFICATION OF DISCHARGE RECOMMENDATION   Based on current diagnosis, functional performance prior to admission, and current functional performance, this patient requires continued OT services in inpatient rehabilitation facility  in order to achieve significant functional improvements.    Plan:   Current Intervention: ADL retraining, IADL retraining, balance training, bed mobility training, cognitive retraining, endurance training, fine motor coordination training, joint mobilization, motor coordination training, ROM (range of motion), strengthening, stretching, therapeutic exercise, transfer training    To provide Occupational therapy services 1x/day, minimum of 2x/week, until discharge, until goals are met.       The risks/benefits of therapy have been discussed with the patient/caregiver and he/she is in agreement with the established plan of care.       Subjective & Objective        06/20/23 1113   Therapist Pager   OT Assigned/ Pager # Jeanette Milks [phone]   Rehab Session   Document Type evaluation   OT Visit Date 06/20/23   Total OT Minutes: 16   Patient Effort good   Symptoms Noted During/After Treatment fatigue;increased  pain   General Information   Patient Profile Reviewed yes   Onset of Illness/Injury or Date of Surgery 06/15/23   Pertinent History of Current Functional Problem 53 y.o. female: 1. 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   Medical Lines Arterial Line;PIV Line;Telemetry;Foley Catheter   Respiratory Status nasal cannula   Existing Precautions/Restrictions fall precautions;full code;oxygen therapy device and L/min   Pre Treatment Status   Pre Treatment Patient Status Patient supine in bed;Call light within reach;Telephone within reach;Patient safety alarm activated;Nurse approved session   Support Present Pre Treatment  Family present   Communication Pre Treatment  Nurse   Mutuality/Individual Preferences   Individualized Care Needs OOB with Ax2 Dwana Gist   Living Environment   Lives With child(ren), adult;child(ren), dependent   Living Arrangements apartment   Living Environment Comment Son and daughter   Home Main Entrance   Number of Stairs, Main Entrance ten   Stairs Within Home, Primary   Stairs, Within Home, Primary 1 flight   Functional Level Prior   Ambulation 0 - independent   Transferring 0 - independent   Toileting 0 - independent   Bathing 0 - independent   Dressing 0 - independent   Eating 0 - independent   Self-Care   Current Activity Tolerance fair   Equipment Currently Used at Home no   Vital Signs   O2 Delivery Pre Treatment supplemental O2  O2 Delivery Post Treatment supplemental O2   Vitals Comment 3L   Pain Assessment   Pre/Posttreatment Pain Comment c/o abdominal pain; no formal rating provided   Coping/Psychosocial   Observed Emotional State calm;cooperative   Verbalized Emotional State acceptance   Family/Support System   Family/Support Persons mother   Involvement in Care at bedside;attentive to  patient;interacting with patient;participating in care   Coping/Psychosocial Response Interventions   Plan Of Care Reviewed With patient   Cognition   Behavior/Mood Observations alert;cooperative;distractible   Attention mild impairment;distractible;difficulty attending to task/directions   Follows Commands follows one step commands;verbal cues/prompting required   RUE Assessment   RUE Assessment WFL for stated baseline   LUE Assessment   LUE Assessment WFL for stated baseline   Bed Mobility   Supine-Sit [place] maximum assist (25% patient effort)   Sit to Supine, [place] maximum assist (25% patient effort)   Bed Mobility, Assistive Device Head of Bed Elevated   Safety Issues decreased use of arms for pushing/pulling;impaired trunk control for bed mobility;decreased use of legs for bridging/pushing   Impairments balance impaired;coordination impaired;endurance;pain;strength decreased   Transfer Assessment/Treatment   Sit-Stand [place] not tested   Transfer Comment Pt declined.   Lower Body Dressing Assessment/Training   Position sitting   DRESSING ASSESSED Don Socks   Independence Level  dependent (less than 25% patient effort)   Impairments balance impaired;coordination impaired;pain;ROM decreased;strength decreased   Balance   Sitting Balance: Static fair - balance   Sitting, Dynamic (Balance) fair - balance   Systems Impairment Contributing to Balance Disturbance musculoskeletal   Identified Impairments Contributing to Balance Disturbance pain   Post Treatment Status   Post Treatment Patient Status Patient supine in bed;Call light within reach;Telephone within reach;Patient safety alarm activated   Support Present Post Treatment  Family present   Film/video editor Nurse   Care Plan Goals   OT Rehab Goals LB Dressing Goal;Toileting Goal;Transfer Training Goal 2;UB Dressing Goal   LB Dressing Goal   LB Dressing Goal, Date Established 06/20/23   LB Dressing Goal, Time to Achieve by  discharge   LB Dressing Goal, Activity Type all lower body dressing tasks   LB Dressing Goal, [place] Level independent   Toileting Goal   Toileting Goal, Date Established 06/20/23   Toileting Goal, Time to Achieve by discharge   Toileting Goal, Activity Type all toileting tasks   Toileting Goal, [place] Level independent   Transfer Training Goal 2   Transfer Training Goal, Date Established 06/20/23   Transfer Training Goal, Time to Achieve by discharge   Transfer Training Goal, Activity Type bed-to-chair/chair-to-bed;sit-to-stand/stand-to-sit;toilet   Transfer Training Goal, [place] Level modified independence   UB Dressing Goal   UB Dressing  Goal, Date Established 06/20/23   UB Dressing Goal, Time to Achieve by discharge   UB Dressing Goal, Activity Type all upper body dressing tasks   UB Dressing Goal, [place] Level independent   Planned Therapy Interventions, OT Eval   Planned Therapy Interventions ADL retraining;IADL retraining;balance training;bed mobility training;cognitive retraining;endurance training;fine motor coordination training;joint mobilization;motor coordination training;ROM (range of motion);strengthening;stretching;therapeutic exercise;transfer training   Clinical Impression   Functional Level at Time of Session Pt tolerated OT evaluation fair this date. Pt presents with functional deficits in strength, balance, endurance, and pain decreasing pt's functional IND from reported PLOF. Pt compensating with MAX A to complete bed mobility and unable to attempt OOB activity secondary to pain. OT recommending continued intervention in acute rehab setting.   Criteria for Skilled Therapeutic Interventions  Met (OT) yes   Rehab Potential good   Therapy Frequency 1x/day;minimum of 2x/week   Predicted Duration of Therapy until discharge;until goals are met   Anticipated Equipment Needs at Discharge to be determined   Anticipated Discharge Disposition inpatient rehabilitation facility    Highest level of Mobility score   Exercise/Activity Level Performed 3- Sat at edge of bed   Evaluation Complexity Justification   Occupational Profile Review Expanded review   Performance Deficits Strength;Coordination;Endurance;Balance;Mobility;Pain;5+ deficits   Clinical Decision Making Moderate analytic complexity   Evaluation Complexity Moderate       Therapist:   Burnetta Cart, OT   Pager #: [phone]

## 2023-06-20 NOTE — Care Plan (Signed)
 [facility]  Rehabilitation Services  Physical Therapy Initial Evaluation    Patient Name: Elizabeth [name]  Date of Birth: October 14, 1970  Height: Height: 157.5 cm (5\' 2" )  Weight: Weight: 89.5 kg (197 lb 5 oz)  Room/Bed: 806/A  Payor: [employer] / Plan: [place] [employer] PPO / Product Type: PPO /     Assessment:      Pt tolerated mobility fair this date. Limtied by abd pain and pt somewhat somnolent during exam but arousal improved once upright sitting EOB. MAX A for bed mobility and was unable to complete STS 2/2 pain and fatigue. Will follow and progress as able. Rec IRF    Discharge Needs:    Equipment Recommendation: TBD    Discharge Disposition: inpatient rehabilitation facility    JUSTIFICATION OF DISCHARGE RECOMMENDATION   Based on current diagnosis, functional performance prior to admission, and current functional performance, this patient requires continued PT services in inpatient rehabilitation facility in order to achieve significant functional improvements in these deficit areas: aerobic capacity/endurance, ergonomics and body mechanics, gait, locomotion, and balance, muscle performance.        Plan:   Current Intervention: bed mobility training, balance training, gait training, patient/family education, postural re-education, stair training, strengthening, stretching, transfer training  To provide physical therapy services minimum of 1x/week  for duration of until discharge.    The risks/benefits of therapy have been discussed with the patient/caregiver and he/she is in agreement with the established plan of care.       Subjective & Objective        06/20/23 1112   Therapist Pager   PT Assigned/ Pager # Wynona Hedger [phone]   Rehab Session   Document Type evaluation   PT Visit Date 06/20/23   Total PT Minutes: 16   Patient Effort, Rehab Treatment Comment good   Symptoms Noted During/After Treatment fatigue;increased pain   General Information   Patient Profile Reviewed yes   Onset of  Illness/Injury or Date of Surgery 06/15/23   Pertinent History of Current Functional Problem 53 y.o. female:     1. 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   Medical Lines PIV Line;Telemetry;Arterial Line   Respiratory Status nasal cannula   Existing Precautions/Restrictions fall precautions;full code;oxygen therapy device and L/min   Mutuality/Individual Preferences   Individualized Care Needs OOB sara stedy A x 2   Plan of Care Reviewed With patient   Living Environment   Lives With child(ren), dependent;child(ren), adult   Living Arrangements apartment   Home Accessibility bed and bath are not on the first floor;stairs to enter home;stairs within home   Stairs Within Home, Primary   Stairs, Within Home, Primary 1 flight   Home Main Entrance   Number of Stairs, Main Entrance ten   Functional Level Prior   Ambulation 0 - independent   Transferring 0 - independent   Toileting 0 - independent   Bathing 0 - independent   Dressing 0 - independent   Eating 0 - independent   Prior Functional Level Comment normally IND but has cane and lift chair   Self-Care   Current Activity Tolerance fair   Equipment Currently Used at Home no   Pre Treatment Status   Pre Treatment Patient Status Patient supine in bed;Call light within reach;Telephone within reach;Patient safety  alarm activated;Nurse approved session   Support Present Pre Treatment  Family present   Communication Pre Treatment  Nurse   Cognition   Behavior/Mood Observations alert;cooperative;distractible   Attention mild impairment;difficulty attending to task/directions   Follows Commands follows one step commands   Vital Signs   O2 Delivery Pre Treatment supplemental O2   O2 Delivery Post Treatment supplemental O2   Pain Assessment   Pre/Posttreatment Pain Comment c/o abd pain limiting  mobility   RLE Assessment   RLE Assessment WFL for stated baseline   LLE Assessment   LLE Assessment WFL for stated baseline   Bed Mobility   Supine-Sit Independence maximum assist (25% patient effort)   Sit to Supine, Independence maximum assist (25% patient effort)   Bed Mobility, Assistive Device Head of Bed Elevated   Comment sat EOB and progressed to SBA for sitting balance   Safety Issues decreased use of legs for bridging/pushing;impaired trunk control for bed mobility   Impairments pain;flexibility decreased;strength decreased   Balance   Sitting Balance: Static fair - balance   Sitting, Dynamic (Balance) fair - balance   Identified Impairments Contributing to Balance Disturbance pain   Post Treatment Status   Post Treatment Patient Status Call light within reach;Telephone within reach;Patient safety alarm activated;Patient supine in bed   Support Present Post Treatment  Family present   Communication Post Treatement Nurse   Plan of Care Review   Plan Of Care Reviewed With patient   Basic Mobility Am-PAC/6Clicks Score (APPROVED Staff)   Turning in bed without bedrails 2   Lying on back to sitting on edge of flat bed 2   Moving to and from a bed to a chair 1   Standing up from chair 2   Walk in room 1   Climbing 3-5 steps with railing 1   6 Clicks Raw Score total 9   Standardized (t-scale) score 25.8   Patient Mobility Goal [facility]) 4- Move to chair 3X/day   Exercise/Activity Level Performed 3- Sat at edge of bed   Physical Therapy Clinical Impression   Assessment Pt tolerated mobility fair this date. Limtied by abd pain and pt somewhat somnolent during exam but arousal improved once upright sitting EOB. MAX A for bed mobility and was unable to complete STS 2/2 pain and fatigue. Will follow and progress as able. Rec IRF   Patient/Family Goals Statement get better   Criteria for Skilled Therapeutic yes;skilled treatment is necessary;meets criteria   Pathology/Pathophysiology Noted musculoskeletal   Impairments  Found (describe specific impairments) aerobic capacity/endurance;ergonomics and body mechanics;gait, locomotion, and balance;muscle performance   Functional Limitations in Following  self-care;work;community/leisure   Disability: Inability to Perform community/leisure   Rehab Potential good   Therapy Frequency minimum of 1x/week   Predicted Duration of Therapy Intervention (days/wks) until discharge   Anticipated Equipment Needs at Discharge (PT) TBD   Anticipated Discharge Disposition inpatient rehabilitation facility   Evaluation Complexity Justification   Patient History: Co-morbidity/factors that impact Plan of Care 1-2 that impact Plan of Care;One or more other medical co-morbidity   Examination Components 1-2 Exam elements addressed;Balance;Transfers;Ambulation;Bed mobility   Presentation Evolving: Symptoms, complaints, characteristics of condition changing &/or cognitive deficits present   Clinical Decision Making Moderate complexity   Evaluation Complexity Moderate complexity   Care Plan Goals   PT Rehab Goals Bed Mobility Goal;Gait Training Goal;Transfer Training Goal   Bed Mobility Goal   Bed Mobility Goal, Date Established 06/20/23   Bed Mobility Goal, Time to Achieve by discharge  Bed Mobility Goal, Activity Type all bed mobility activities   Bed Mobility Goal, Independence Level modified independence   Bed Mobility Goal, Assistive Device least restrictive assistive device   Gait Training  Goal, Distance to Achieve   Gait Training  Goal, Date Established 06/20/23   Gait Training  Goal, Time to Achieve by discharge   Gait Training  Goal, Independence Level modified independence   Gait Training  Goal, Assist Device least restricted assistive device   Gait Training  Goal, Distance to Achieve 150   Transfer Training Goal   Transfer Training Goal, Date Established 06/20/23   Transfer Training Goal, Time to Achieve by discharge   Transfer Training Goal, Activity Type all transfers   Transfer Training Goal,  Independence Level modified independence   Transfer Training Goal, Assist Device least restrictive assistive device   Planned Therapy Interventions, PT Eval   Planned Therapy Interventions (PT) bed mobility training;balance training;gait training;patient/family education;postural re-education;stair training;strengthening;stretching;transfer training       Therapist:   Sheralyn Dies, PT   Pager #: [phone]

## 2023-06-20 NOTE — Care Plan (Signed)
 Plan of care reviewed with patient.Fall and Skin precautions maintained. Pt not tolerating clear liquid diet. Patient had a small amount of water  and jucie this shift. Automatic tray order placed to encourage PO intake. Patient ambulates with x1-2 assist. PT sat patient on the side of the bed and then had to return patient to bed this shift. Patient encouraged to turn and turning assistance offered every 2 hours. Patient's pain is controlled with PRN. And scheduled medications. Patient required x1 dose of PRN oral medication this shift. Patient's epidural medication rate adjusted by anesthesia. SKIN/WOUNDS surgical site. DC planning ongoing.    Problem: Adult Inpatient Plan of Care  Goal: Plan of Care Review  Outcome: Ongoing (see interventions/notes)  Flowsheets (Taken 06/20/2023 1840)  Plan of Care Reviewed With:   patient   parent  Goal: Patient-Specific Goal (Individualized)  Outcome: Ongoing (see interventions/notes)  Flowsheets (Taken 06/20/2023 0800)  Anxieties, Fears or Concerns: patient's mother has concerns about her daughter's health  Goal: Absence of Hospital-Acquired Illness or Injury  Outcome: Ongoing (see interventions/notes)  Intervention: Identify and Manage Fall Risk  Recent Flowsheet Documentation  Taken 06/20/2023 1800 by Irena Manners  Safety Promotion/Fall Prevention: safety round/check completed  Taken 06/20/2023 1600 by Irena Manners  Safety Promotion/Fall Prevention: safety round/check completed  Taken 06/20/2023 1400 by Irena Manners  Safety Promotion/Fall Prevention: safety round/check completed  Taken 06/20/2023 1200 by Irena Manners  Safety Promotion/Fall Prevention: safety round/check completed  Taken 06/20/2023 1000 by Irena Manners  Safety Promotion/Fall Prevention: safety round/check completed  Taken 06/20/2023 0800 by Irena Manners  Safety Promotion/Fall Prevention:   nonskid shoes/slippers when out of bed   safety round/check completed  Intervention: Prevent Skin Injury  Recent Flowsheet Documentation  Taken 06/20/2023 1800  by Irena Manners  Body Position:   supine, head elevated   refused  Taken 06/20/2023 1600 by Irena Manners  Body Position: supine, head elevated  Taken 06/20/2023 1400 by Irena Manners  Body Position: supine, head elevated  Taken 06/20/2023 1200 by Irena Manners  Body Position: side lying, left  Taken 06/20/2023 1000 by Irena Manners  Body Position: supine, head elevated  Taken 06/20/2023 0800 by Irena Manners  Body Position: side lying, left  Skin Protection:   adhesive use limited   transparent dressing maintained   tubing/devices free from skin contact   electrode sites changed   pulse oximeter probe site changed  Intervention: Prevent and Manage VTE (Venous Thromboembolism) Risk  Recent Flowsheet Documentation  Taken 06/20/2023 0800 by Irena Manners  VTE Prevention/Management:   ambulation promoted   anticoagulant therapy maintained   dorsiflexion/plantar flexion performed  Intervention: Prevent Infection  Recent Flowsheet Documentation  Taken 06/20/2023 0800 by Irena Manners  Infection Prevention: promote handwashing  Goal: Optimal Comfort and Wellbeing  Outcome: Ongoing (see interventions/notes)  Intervention: Provide Person-Centered Care  Recent Flowsheet Documentation  Taken 06/20/2023 0800 by Irena Manners  Trust Relationship/Rapport:   care explained   emotional support provided   empathic listening provided   questions encouraged   reassurance provided   thoughts/feelings acknowledged  Goal: Rounds/Family Conference  Outcome: Ongoing (see interventions/notes)     Problem: Health Knowledge, Opportunity to Enhance (Adult,Obstetrics,Pediatric)  Goal: Knowledgeable about Health Subject/Topic  Description: Patient will demonstrate the desired outcomes by discharge/transition of care.  Outcome: Ongoing (see interventions/notes)  Intervention: Enhance Health Knowledge  Recent Flowsheet Documentation  Taken 06/20/2023 0800 by Irena Manners  Family/Support System Care: caregiver stress acknowledged     Problem: Fall Injury Risk  Goal: Absence of Fall and Fall-Related  Injury  Outcome: Ongoing (see interventions/notes)  Intervention: Identify and Manage  Contributors  Recent Flowsheet Documentation  Taken 06/20/2023 1600 by Irena Manners  Medication Review/Management: medications reviewed  Taken 06/20/2023 0800 by Irena Manners  Self-Care Promotion: independence encouraged  Medication Review/Management: medications reviewed  Intervention: Promote Injury-Free Environment  Recent Flowsheet Documentation  Taken 06/20/2023 1800 by Irena Manners  Safety Promotion/Fall Prevention: safety round/check completed  Taken 06/20/2023 1600 by Irena Manners  Safety Promotion/Fall Prevention: safety round/check completed  Taken 06/20/2023 1400 by Irena Manners  Safety Promotion/Fall Prevention: safety round/check completed  Taken 06/20/2023 1200 by Irena Manners  Safety Promotion/Fall Prevention: safety round/check completed  Taken 06/20/2023 1000 by Irena Manners  Safety Promotion/Fall Prevention: safety round/check completed  Taken 06/20/2023 0800 by Irena Manners  Safety Promotion/Fall Prevention:   nonskid shoes/slippers when out of bed   safety round/check completed     Problem: Surgery Nonspecified  Goal: Absence of Bleeding  Outcome: Ongoing (see interventions/notes)  Intervention: Monitor and Manage Bleeding  Recent Flowsheet Documentation  Taken 06/20/2023 0800 by Irena Manners  Bleeding Management: dressing monitored  Goal: Effective Bowel Elimination  Outcome: Ongoing (see interventions/notes)  Goal: Fluid and Electrolyte Balance  Outcome: Ongoing (see interventions/notes)  Goal: Blood Glucose Level Within Target Range  Outcome: Ongoing (see interventions/notes)  Goal: Absence of Infection Signs and Symptoms  Outcome: Ongoing (see interventions/notes)  Intervention: Prevent or Manage Infection  Recent Flowsheet Documentation  Taken 06/20/2023 0800 by Irena Manners  Fever Reduction/Comfort Measures:   lightweight bedding   lightweight clothing  Goal: Anesthesia/Sedation Recovery  Outcome: Ongoing (see interventions/notes)  Intervention: Optimize Anesthesia  Recovery  Recent Flowsheet Documentation  Taken 06/20/2023 1800 by Irena Manners  Reason Not Given: Patient Unable to Understand directions  Safety Promotion/Fall Prevention: safety round/check completed  Taken 06/20/2023 1700 by Irena Manners  Reason Not Given: Patient Unable to Understand directions  Taken 06/20/2023 1600 by Irena Manners  Reason Not Given: Patient Unable to Understand directions  Safety Promotion/Fall Prevention: safety round/check completed  Taken 06/20/2023 1500 by Irena Manners  Reason Not Given: Patient Unable to Understand directions  Taken 06/20/2023 1400 by Irena Manners  Reason Not Given: Patient Unable to Understand directions  Safety Promotion/Fall Prevention: safety round/check completed  Taken 06/20/2023 1300 by Irena Manners  Reason Not Given: Patient Unable to Understand directions  Taken 06/20/2023 1200 by Irena Manners  Reason Not Given: Patient Unable to Understand directions  Safety Promotion/Fall Prevention: safety round/check completed  Taken 06/20/2023 1100 by Irena Manners  Reason Not Given: Patient Unable to Understand directions  Taken 06/20/2023 1000 by Irena Manners  Reason Not Given: Patient Unable to Understand directions  Safety Promotion/Fall Prevention: safety round/check completed  Taken 06/20/2023 0900 by Irena Manners  Reason Not Given: Patient Unable to Understand directions  Taken 06/20/2023 0800 by Irena Manners  Reason Not Given: Patient Unable to Understand directions  Safety Promotion/Fall Prevention:   nonskid shoes/slippers when out of bed   safety round/check completed  Goal: Optimal Pain Control and Function  Outcome: Ongoing (see interventions/notes)  Intervention: Prevent or Manage Pain  Recent Flowsheet Documentation  Taken 06/20/2023 1800 by Irena Manners  Diversional Activities: television  Taken 06/20/2023 0800 by Irena Manners  Diversional Activities: television  Goal: Nausea and Vomiting Relief  Outcome: Ongoing (see interventions/notes)  Goal: Effective Urinary Elimination  Outcome: Ongoing (see interventions/notes)  Goal: Effective  Oxygenation and Ventilation  Outcome: Ongoing (see interventions/notes)  Intervention: Optimize Oxygenation and Ventilation  Recent Flowsheet Documentation  Taken 06/20/2023 1600 by Irena Manners  Head of Bed [facility]) Positioning: HOB elevated  Taken 06/20/2023 1400 by Irena Manners  Head of Bed [facility]) Positioning: HOB elevated  Taken 06/20/2023 1200 by Irena Manners  Head of  Bed [facility]) Positioning: 30 degrees  Taken 06/20/2023 1000 by Irena Manners  Head of Bed [facility]) Positioning: HOB elevated  Taken 06/20/2023 0800 by Irena Manners  Head of Bed [facility]) Positioning: 30 degrees     Problem: Wound  Goal: Optimal Coping  Outcome: Ongoing (see interventions/notes)  Intervention: Support Patient and Family Response  Recent Flowsheet Documentation  Taken 06/20/2023 0800 by Irena Manners  Family/Support System Care: caregiver stress acknowledged  Goal: Optimal Functional Ability  Outcome: Ongoing (see interventions/notes)  Intervention: Optimize Functional Ability  Recent Flowsheet Documentation  Taken 06/20/2023 1800 by Irena Manners  Activity Management: ROM, active encouraged  Activity Assistance Provided: assistance, 2 people  Taken 06/20/2023 1600 by Irena Manners  Activity Management: ROM, active encouraged  Activity Assistance Provided: assistance, 2 people  Taken 06/20/2023 1400 by Irena Manners  Activity Management: ROM, active encouraged  Activity Assistance Provided: assistance, 2 people  Taken 06/20/2023 1200 by Irena Manners  Activity Management: ROM, active encouraged  Activity Assistance Provided: assistance, 2 people  Taken 06/20/2023 1000 by Irena Manners  Activity Management: ROM, active encouraged  Activity Assistance Provided: assistance, 2 people  Taken 06/20/2023 0800 by Irena Manners  Activity Management: ROM, active encouraged  Activity Assistance Provided: assistance, 2 people  Goal: Absence of Infection Signs and Symptoms  Outcome: Ongoing (see interventions/notes)  Intervention: Prevent or Manage Infection  Recent Flowsheet Documentation  Taken 06/20/2023 0800 by Irena Manners  Fever  Reduction/Comfort Measures:   lightweight bedding   lightweight clothing  Goal: Improved Oral Intake  Outcome: Ongoing (see interventions/notes)  Goal: Optimal Pain Control and Function  Outcome: Ongoing (see interventions/notes)  Intervention: Prevent or Manage Pain  Recent Flowsheet Documentation  Taken 06/20/2023 0800 by Irena Manners  Sleep/Rest Enhancement: consistent schedule promoted  Goal: Skin Health and Integrity  Outcome: Ongoing (see interventions/notes)  Intervention: Optimize Skin Protection  Recent Flowsheet Documentation  Taken 06/20/2023 1800 by Irena Manners  Activity Management: ROM, active encouraged  Taken 06/20/2023 1600 by Irena Manners  Activity Management: ROM, active encouraged  Head of Bed [facility]) Positioning: HOB elevated  Taken 06/20/2023 1400 by Irena Manners  Activity Management: ROM, active encouraged  Head of Bed [facility]) Positioning: HOB elevated  Taken 06/20/2023 1200 by Irena Manners  Activity Management: ROM, active encouraged  Head of Bed [facility]) Positioning: 30 degrees  Taken 06/20/2023 1000 by Irena Manners  Activity Management: ROM, active encouraged  Head of Bed [facility]) Positioning: HOB elevated  Taken 06/20/2023 0800 by Irena Manners  Pressure Reduction Techniques:   Heels elevated off of the bed   Patient turned q 2 hours   Mobility is maximized   Moisture, shear and nutrition are maximized   Supplemented with small shifts   Frequent weight shifting encouraged  Pressure Reduction Devices:   Heel offloading device utilized   Pressure redistributing mattress utilized   Specialty bed/mattress utilized   Repositioning wedges/pillows utilized   Sacral dressing utilized if not incontinent of bowel   Pressure reduction chair cushion utilized  Activity Management: ROM, active encouraged  Head of Bed (HOB) Positioning: 30 degrees  Goal: Optimal Wound Healing  Outcome: Ongoing (see interventions/notes)  Intervention: Promote Wound Healing  Recent Flowsheet Documentation  Taken 06/20/2023 1800 by Irena Manners  Activity Management: ROM, active  encouraged  Taken 06/20/2023 1600 by Irena Manners  Activity Management: ROM, active encouraged  Taken 06/20/2023 1400 by Irena Manners  Activity Management: ROM, active encouraged  Taken 06/20/2023 1200 by Irena Manners  Activity Management: ROM, active encouraged  Taken 06/20/2023 1000 by Irena Manners  Activity Management: ROM, active encouraged  Taken 06/20/2023 0800 by Geffrey Michaelsen  Pressure Reduction Techniques:  Heels elevated off of the bed   Patient turned q 2 hours   Mobility is maximized   Moisture, shear and nutrition are maximized   Supplemented with small shifts   Frequent weight shifting encouraged  Pressure Reduction Devices:   Heel offloading device utilized   Pressure redistributing mattress utilized   Specialty bed/mattress utilized   Repositioning wedges/pillows utilized   Sacral dressing utilized if not incontinent of bowel   Pressure reduction chair cushion utilized  Sleep/Rest Enhancement: consistent schedule promoted  Activity Management: ROM, active encouraged     Problem: Skin Injury Risk Increased  Goal: Skin Health and Integrity  Outcome: Ongoing (see interventions/notes)  Intervention: Optimize Skin Protection  Recent Flowsheet Documentation  Taken 06/20/2023 1800 by Irena Manners  Activity Management: ROM, active encouraged  Taken 06/20/2023 1600 by Irena Manners  Activity Management: ROM, active encouraged  Head of Bed [facility]) Positioning: HOB elevated  Taken 06/20/2023 1400 by Irena Manners  Activity Management: ROM, active encouraged  Head of Bed [facility]) Positioning: HOB elevated  Taken 06/20/2023 1200 by Irena Manners  Activity Management: ROM, active encouraged  Head of Bed [facility]) Positioning: 30 degrees  Taken 06/20/2023 1000 by Irena Manners  Activity Management: ROM, active encouraged  Head of Bed [facility]) Positioning: HOB elevated  Taken 06/20/2023 0800 by Irena Manners  Pressure Reduction Techniques:   Heels elevated off of the bed   Patient turned q 2 hours   Mobility is maximized   Moisture, shear and nutrition are maximized   Supplemented with small  shifts   Frequent weight shifting encouraged  Pressure Reduction Devices:   Heel offloading device utilized   Pressure redistributing mattress utilized   Specialty bed/mattress utilized   Repositioning wedges/pillows utilized   Sacral dressing utilized if not incontinent of bowel   Pressure reduction chair cushion utilized  Skin Protection:   adhesive use limited   transparent dressing maintained   tubing/devices free from skin contact   electrode sites changed   pulse oximeter probe site changed  Activity Management: ROM, active encouraged  Head of Bed (HOB) Positioning: 30 degrees     Problem: Pain Acute  Goal: Optimal Pain Control and Function  Outcome: Ongoing (see interventions/notes)  Intervention: Optimize Psychosocial Wellbeing  Recent Flowsheet Documentation  Taken 06/20/2023 1800 by Irena Manners  Diversional Activities: television  Taken 06/20/2023 0800 by Irena Manners  Diversional Activities: television  Intervention: Prevent or Manage Pain  Recent Flowsheet Documentation  Taken 06/20/2023 1600 by Irena Manners  Medication Review/Management: medications reviewed  Taken 06/20/2023 0800 by Irena Manners  Sleep/Rest Enhancement: consistent schedule promoted  Medication Review/Management: medications reviewed

## 2023-06-21 ENCOUNTER — Inpatient Hospital Stay (HOSPITAL_COMMUNITY)

## 2023-06-21 DIAGNOSIS — J90 Pleural effusion, not elsewhere classified: Secondary | ICD-10-CM

## 2023-06-21 DIAGNOSIS — J9811 Atelectasis: Secondary | ICD-10-CM

## 2023-06-21 LAB — BASIC METABOLIC PANEL
ANION GAP: 6 mmol/L (ref 4–13)
BUN/CREA RATIO: 10 (ref 6–22)
BUN: 6 mg/dL — ABNORMAL LOW (ref 8–25)
CALCIUM: 7.8 mg/dL — ABNORMAL LOW (ref 8.6–10.2)
CHLORIDE: 109 mmol/L (ref 96–111)
CO2 TOTAL: 22 mmol/L (ref 22–30)
CREATININE: 0.63 mg/dL (ref 0.60–1.05)
ESTIMATED GFR - FEMALE: >90 mL/min/BSA (ref >=60)
GLUCOSE: 99 mg/dL (ref 65–125)
POTASSIUM: 3 mmol/L — ABNORMAL LOW (ref 3.5–5.1)
SODIUM: 137 mmol/L (ref 136–145)

## 2023-06-21 LAB — CBC
HCT: 29.3 % — ABNORMAL LOW (ref 34.8–46.0)
HGB: 10.1 g/dL — ABNORMAL LOW (ref 11.5–16.0)
MCH: 31.9 pg (ref 26.0–32.0)
MCHC: 34.5 g/dL (ref 31.0–35.5)
MCV: 92.4 fL (ref 78.0–100.0)
MPV: 9.3 fL (ref 8.7–12.5)
PLATELETS: 172 10*3/uL (ref 150–400)
RBC: 3.17 10*6/uL — ABNORMAL LOW (ref 3.85–5.22)
RDW-CV: 13.4 % (ref 11.5–15.5)
WBC: 11.1 10*3/uL — ABNORMAL HIGH (ref 3.7–11.0)

## 2023-06-21 LAB — PHOSPHORUS: PHOSPHORUS: 1.9 mg/dL — ABNORMAL LOW (ref 2.4–4.7)

## 2023-06-21 LAB — MAGNESIUM: MAGNESIUM: 1.8 mg/dL (ref 1.8–2.6)

## 2023-06-21 MED ORDER — POTASSIUM BICARBONATE-CITRIC ACID 20 MEQ EFFERVESCENT TABLET
40.0000 meq | EFFERVESCENT_TABLET | Freq: Two times a day (BID) | ORAL | Status: AC
Start: 2023-06-21 — End: 2023-06-21
  Administered 2023-06-21 (×2): 40 meq via ORAL
  Filled 2023-06-21 (×2): qty 2

## 2023-06-21 NOTE — Progress Notes (Signed)
 [facility]  ACUTE PAIN SERVICE PROGRESS NOTE    DATE OF SERVICE:  06/21/2023  DIAGNOSIS:  Acute post-operative pain, s/p NEPHRECTOMY (Left)  ADRENALECTOMY (Left: Kidney)      BLOCK PERFORMED:    Thoracic Epidural  Catheter placed 06/18/2023. Today is post-procedure day 1.    MEDICATION:  Ropivacaine  0.2% + Fentanyl  28mcg/ml @ 8cc/hr    PLAN:   - Continue current infusion without change  - Oral medications per primary service  - Please do not use any additional local anesthetic or apply a lidocaine  patch.  - Anticoagulation instructions - OK for subq heparin , will need to be held for 6 hrs prior to contacting RAP team to remove the catheter.   - Please call [phone] w/questions or concerns regarding Elizabeth [name]'s regional anesthesia management    Catheter Site Assessment:  Clean/dry/intact    PE:   Neuro: alert and cooperative  Resp: non-labored breathing    SUBJECTIVE:  She reports fair control of her pain (5/10).  When she has pain it is located diffusely in the abdomen.  She has no complaints related to her block.  Denies headache, perioral numbness, metallic taste, and tinnitus.  Offered to remove epidural catheter today, but she desires to leave it in for now because she feels it has been helping with her pain control    VITALS:   Temperature: 36.7 C (98 F)  Heart Rate: 88  BP (Non-Invasive): 125/68  Respiratory Rate: 20  SpO2: 96 %    Analgesics (last 24 hours)       Date/Time Action Medication Dose Rate    06/19/23 1214 Rate Change    fentaNYL  2 mcg/mL + ROPivacaine  0.2% in NS 250mL (tot vol) epidural infusion  6 mL/hr    06/19/23 1107 Rate Change    fentaNYL  2 mcg/mL + ROPivacaine  0.2% in NS 250mL (tot vol) epidural infusion 3 mL/hr 3 mL/hr    06/19/23 0856 Rate Change    fentaNYL  2 mcg/mL + ROPivacaine  0.2% in NS 250mL (tot vol) epidural infusion 3 mL/hr 3 mL/hr    06/19/23 0831 Rate Change    fentaNYL  2 mcg/mL + ROPivacaine  0.2% in NS 250mL (tot vol) epidural infusion 2 mL/hr 2 mL/hr    06/19/23  0813 Rate Change    fentaNYL  2 mcg/mL + ROPivacaine  0.2% in NS 250mL (tot vol) epidural infusion 4 mL/hr 4 mL/hr    06/19/23 0710 Rate Change    fentaNYL  2 mcg/mL + ROPivacaine  0.2% in NS 250mL (tot vol) epidural infusion 2 mL/hr 2 mL/hr    06/18/23 2017 Given    acetaminophen  (TYLENOL ) tablet 650 mg     06/18/23 1615 Given    acetaminophen  (TYLENOL ) tablet 650 mg     06/18/23 1354 New Bag/New Syringe    fentaNYL  2 mcg/mL + ROPivacaine  0.2% in NS 250mL (tot vol) epidural infusion 2 mL/hr 2 mL/hr            Anticoagulants/Antiplatelets (last 24 hours)       Date/Time Action Medication Dose    06/21/23 1331 Given    heparin  5,000 unit/mL injection 5,000 Units    06/21/23 0529 Given    heparin  5,000 unit/mL injection 5,000 Units    06/20/23 2057 Given    heparin  5,000 unit/mL injection 5,000 Units    06/20/23 1441 Given    heparin  5,000 unit/mL injection 5,000 Units            Liver/Pancreas Enzyme Results Liver Function Results   No  results found for this or any previous visit (from the past 30 hours). No results found for this or any previous visit (from the past 30 hours).          BMP Results Other Chemistries Results   Results for orders placed or performed during the hospital encounter of 06/15/23 (from the past 30 hours)   BASIC METABOLIC PANEL    Collection Time: 06/21/23  8:02 AM   Result Value    SODIUM 137    POTASSIUM 3.0 (L)    CHLORIDE 109    CO2 TOTAL 22    BUN 6 (L)    CREATININE 0.63    Recent Results (from the past 30 hours)   MAGNESIUM    Collection Time: 06/21/23  8:02 AM   Result Value    MAGNESIUM 1.8   PHOSPHORUS    Collection Time: 06/21/23  8:02 AM   Result Value    PHOSPHORUS 1.9 (L)               [name], MD 06/21/2023, 14:16   PGY-3/CA-2 Anesthesiology Resident        I saw and examined the patient.  I reviewed the resident's note.  I agree with the findings and plan of care as documented in the resident's note.  Any exceptions/additions are edited/noted.    Morris Markham,  DO

## 2023-06-21 NOTE — Care Plan (Signed)
 Plan of care reviewed with patient.Fall and Skin precautions maintained. Pt tolerating regular diet however poor intake. Patient ambulates with 2 assist using a sara steady. Patient's pain is controlled with PRN. SKIN/WOUNDS clean dry and intact. DC planning ongoing.    Problem: Adult Inpatient Plan of Care  Goal: Plan of Care Review  Outcome: Ongoing (see interventions/notes)  Goal: Patient-Specific Goal (Individualized)  Outcome: Ongoing (see interventions/notes)  Flowsheets (Taken 06/21/2023 0800)  Individualized Care Needs: assist with ADL's  Anxieties, Fears or Concerns: none  Goal: Absence of Hospital-Acquired Illness or Injury  Outcome: Ongoing (see interventions/notes)  Intervention: Identify and Manage Fall Risk  Recent Flowsheet Documentation  Taken 06/21/2023 1200 by Billy Bue, RN  Safety Promotion/Fall Prevention:   fall prevention program maintained   safety round/check completed  Taken 06/21/2023 1000 by Billy Bue, RN  Safety Promotion/Fall Prevention:   fall prevention program maintained   safety round/check completed  Taken 06/21/2023 0800 by Billy Bue, RN  Safety Promotion/Fall Prevention:   fall prevention program maintained   safety round/check completed   nonskid shoes/slippers when out of bed  Intervention: Prevent Skin Injury  Recent Flowsheet Documentation  Taken 06/21/2023 0800 by Billy Bue, RN  Body Position: supine, head elevated  Skin Protection:   adhesive use limited   incontinence pads utilized   skin-to-skin areas padded  Intervention: Prevent and Manage VTE (Venous Thromboembolism) Risk  Recent Flowsheet Documentation  Taken 06/21/2023 0800 by Billy Bue, RN  VTE Prevention/Management: ambulation promoted  Intervention: Prevent Infection  Recent Flowsheet Documentation  Taken 06/21/2023 1200 by Billy Bue, RN  Infection Prevention: barrier precautions utilized  Taken 06/21/2023 1000 by Billy Bue, RN  Infection Prevention: barrier precautions utilized  Taken 06/21/2023 0800 by Billy Bue, RN  Infection  Prevention: barrier precautions utilized     Problem: Fall Injury Risk  Goal: Absence of Fall and Fall-Related Injury  Outcome: Ongoing (see interventions/notes)  Intervention: Promote Injury-Free Environment  Recent Flowsheet Documentation  Taken 06/21/2023 1200 by Billy Bue, RN  Safety Promotion/Fall Prevention:   fall prevention program maintained   safety round/check completed  Taken 06/21/2023 1000 by Billy Bue, RN  Safety Promotion/Fall Prevention:   fall prevention program maintained   safety round/check completed  Taken 06/21/2023 0800 by Billy Bue, RN  Safety Promotion/Fall Prevention:   fall prevention program maintained   safety round/check completed   nonskid shoes/slippers when out of bed     Problem: Wound  Goal: Optimal Coping  Outcome: Ongoing (see interventions/notes)     Problem: Pain Acute  Goal: Optimal Pain Control and Function  Outcome: Ongoing (see interventions/notes)  Intervention: Prevent or Manage Pain  Recent Flowsheet Documentation  Taken 06/21/2023 0800 by Billy Bue, RN  Sleep/Rest Enhancement:   awakenings minimized   relaxation techniques promoted   regular sleep/rest pattern promoted  Bowel Elimination Promotion: adequate fluid intake promoted  Clearance Cure, RN

## 2023-06-21 NOTE — Progress Notes (Signed)
 [facility]  UROLOGY PROGRESS NOTE   Patient: Elizabeth [name], Elizabeth [name], 53 y.o. female  Date of Admission:  06/15/2023  Date of Birth:  07-02-1970  Date of Service:  06/21/2023     ASSESSMENT:    53 y.o. female:    1. 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 Past medical history significant for diabetes, HTN, astrocytoma  3. Past surgical history significant for lap cholecystectomy  4. Blood thinners: None  5. ECOG: 1 - Ambulatory, restricted in strenuous activity, able to carry out work of a light or sedentary nature      PLAN:   - Advance to regular diet  - CXR and pulm toilet ordered  - Dulcolax suppository   - Appreciate RAP recommendations, will maintain epidural pain catheter today  - Patient must be up and ambulating today    Diet: Regular   Anticoagulation: SQH   Pain regimen: Tylenol , Robaxin , Norco, Dilaudid , Epidural   Bowel regimen: Miralax , Dulcolax suppository    Foley catheter: Maintain with epidural   Drain: N/a   Antibiotics: N/a   IV fluids: Continue at NS 125 mL/hr   Microbiology: N/a   Imaging: N/a   PT/OT recs: Pending     DISPOSITION: Floor status, anticipate discharge in 2-3 days pending pain control, tolerance of regular diet and return of bowel function      SUBJECTIVE:   No overnight events. Afebrile. Continues to have pain but this is stable.     OBJECTIVE:   Temperature: 36.7 C (98.1 F)  Heart Rate: 85  BP (Non-Invasive): (!) 126/54  Respiratory Rate: 18  SpO2: 96 %  PHYSICAL EXAM:  General: 53 y.o. female not in acute distress.    Skin: Warm and dry.     Eyes: Eyes are clear.    Pulmonary: Respiratory effort is unlabored.     Psychiatric: Patient is alert, appropriate mood.    Cardiovascular: Palpable peripheral pulses.     Neurologic: CN II-XII grossly intact, A&O x 3     Gastrointestinal: Soft, appropriately tender, non-distended. Mepilex dressing over left Makuuchi incision non-saturated   Genitourinary: Foley catheter draining clear yellow urine     No results found for this or any previous visit (from the past 24 hours).    ATTESTATION:   Verneda Golder, MD  [facility]   Department of Urology  PGY3 Resident    06/21/2023 08:32    I was immediately available for this patient encounter.  I reviewed the resident's note.   I agree with the findings and plan of care as documented in the resident's note.   Any exceptions/additions are edited/noted.    Pain control improving but minimally ambulatory. Demonstrating a mild cough, is not using IS. Having bowel function. Mild appetite.     Mild hypokalemia, H&H stable, Cr at baseline, good urine output.     CXR today + resp therapy, may need PT assistance for ambulation, should be encouraged by nursing at least TID. Maintain foley until more ambulatory.     Bethel Brooms, MD  Attending, Department of Urology  [facility]  06/21/2023, 08:51

## 2023-06-22 DIAGNOSIS — Z905 Acquired absence of kidney: Secondary | ICD-10-CM

## 2023-06-22 LAB — CBC WITH DIFF
BASOPHIL #: <0.1 10*3/uL (ref <=0.20)
BASOPHIL %: 0.5 %
EOSINOPHIL #: 0.22 10*3/uL (ref <=0.50)
EOSINOPHIL %: 2.6 %
HCT: 29.2 % — ABNORMAL LOW (ref 34.8–46.0)
HGB: 10.2 g/dL — ABNORMAL LOW (ref 11.5–16.0)
IMMATURE GRANULOCYTE #: <0.1 10*3/uL (ref <0.10)
IMMATURE GRANULOCYTE %: 0.6 % (ref 0.0–1.0)
LYMPHOCYTE #: 1.82 10*3/uL (ref 1.00–4.80)
LYMPHOCYTE %: 21.3 %
MCH: 32.4 pg — ABNORMAL HIGH (ref 26.0–32.0)
MCHC: 34.9 g/dL (ref 31.0–35.5)
MCV: 92.7 fL (ref 78.0–100.0)
MONOCYTE #: 0.55 10*3/uL (ref 0.20–1.10)
MONOCYTE %: 6.4 %
MPV: 9.2 fL (ref 8.7–12.5)
NEUTROPHIL #: 5.87 10*3/uL (ref 1.50–7.70)
NEUTROPHIL %: 68.6 %
PLATELETS: 184 10*3/uL (ref 150–400)
RBC: 3.15 10*6/uL — ABNORMAL LOW (ref 3.85–5.22)
RDW-CV: 13.3 % (ref 11.5–15.5)
WBC: 8.6 10*3/uL (ref 3.7–11.0)

## 2023-06-22 LAB — BASIC METABOLIC PANEL
ANION GAP: 8 mmol/L (ref 4–13)
BUN/CREA RATIO: 8 (ref 6–22)
BUN: 5 mg/dL — ABNORMAL LOW (ref 8–25)
CALCIUM: 8 mg/dL — ABNORMAL LOW (ref 8.6–10.2)
CHLORIDE: 108 mmol/L (ref 96–111)
CO2 TOTAL: 22 mmol/L (ref 22–30)
CREATININE: 0.63 mg/dL (ref 0.60–1.05)
ESTIMATED GFR - FEMALE: >90 mL/min/BSA (ref >=60)
GLUCOSE: 96 mg/dL (ref 65–125)
POTASSIUM: 3.2 mmol/L — ABNORMAL LOW (ref 3.5–5.1)
SODIUM: 138 mmol/L (ref 136–145)

## 2023-06-22 LAB — MAGNESIUM: MAGNESIUM: 1.8 mg/dL (ref 1.8–2.6)

## 2023-06-22 LAB — PHOSPHORUS: PHOSPHORUS: 1.9 mg/dL — ABNORMAL LOW (ref 2.4–4.7)

## 2023-06-22 MED ORDER — POTASSIUM CHLORIDE ER 20 MEQ TABLET,EXTENDED RELEASE(PART/CRYST)
20.0000 meq | ORAL_TABLET | Freq: Two times a day (BID) | ORAL | Status: AC
Start: 2023-06-22 — End: 2023-06-24
  Administered 2023-06-22 – 2023-06-24 (×4): 20 meq via ORAL
  Filled 2023-06-22 (×4): qty 1

## 2023-06-22 MED ORDER — ACETAMINOPHEN 325 MG TABLET
650.0000 mg | ORAL_TABLET | Freq: Four times a day (QID) | ORAL | Status: DC
Start: 2023-06-22 — End: 2023-06-24
  Administered 2023-06-22 (×3): 650 mg via ORAL
  Administered 2023-06-23: 0 mg via ORAL
  Administered 2023-06-23 – 2023-06-24 (×5): 650 mg via ORAL
  Filled 2023-06-22 (×8): qty 2

## 2023-06-22 NOTE — Nurses Notes (Signed)
 Pt remained hypertensive throughout the night, complaining of little to no pain following administration of PRN Tylenol . Paged to notify service. VSS. Plan of care ongoing.

## 2023-06-22 NOTE — Progress Notes (Signed)
 [facility]  UROLOGY PROGRESS NOTE   Patient: Elizabeth [name], Elizabeth [name], 53 y.o. female  Date of Admission:  06/15/2023  Date of Birth:  [birth year]  Date of Service:  06/22/2023     ASSESSMENT:    53 y.o. female:    1. 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 Past medical history significant for diabetes, HTN, astrocytoma  3. Past surgical history significant for lap cholecystectomy  4. Blood thinners: None  5. ECOG: 1 - Ambulatory, restricted in strenuous activity, able to carry out work of a light or sedentary nature      PLAN:   - Plan for epidural catheter removal today  - Remove foley catheter once epidural removed  - Continue regular diet   - Continue pulmonary toilet  - Encourage ambulation and IS  - Discontinue IVF    Diet: Regular   Anticoagulation: SQH   Pain regimen: Tylenol , Robaxin , Norco, Dilaudid , Epidural (to be removed today)   Bowel regimen: Miralax    Foley catheter: Remove today following epidural removal    Drain: N/a   Antibiotics: N/a   IV fluids: -   Microbiology: N/a   Imaging: N/a   PT/OT recs: Inpatient rehab      DISPOSITION: Floor status, anticipate discharge in 1-2 days pending pain control following removal of epidural     SUBJECTIVE:   NAEON. Afebrile. Resting comfortably in bed. She continues to have multiple bowel movements. Tolerating small amount of PO intake. Pain well controlled. Cough improved today.     OBJECTIVE:   Temperature: 37 C (98.6 F)  Heart Rate: 85  BP (Non-Invasive): (!) 158/75  Respiratory Rate: 18  SpO2: 97 %  PHYSICAL EXAM:  General: 53 y.o. female not in acute distress.    Skin: Warm and dry.     Eyes: Eyes are clear.    Pulmonary: Respiratory effort is unlabored.     Psychiatric: Patient is  alert, appropriate mood.    Cardiovascular: Palpable peripheral pulses.    Neurologic: CN II-XII grossly intact, A&O x 3     Gastrointestinal: Soft, appropriately tender, non-distended. Mepilex dressing over left Makuuchi incision non-saturated   Genitourinary: Foley catheter draining clear yellow urine     Results for orders placed or performed during the hospital encounter of 06/15/23 (from the past 24 hours)   CBC/DIFF    Collection Time: 06/22/23  7:44 AM    Narrative    The following orders were created for panel order CBC/DIFF.  Procedure                               Abnormality         Status                     ---------                               -----------         ------                     CBC WITH ZOXW[[id number]]  Abnormal            Final result                 Please view results for these tests on the individual orders.   BASIC METABOLIC PANEL    Collection Time: 06/22/23  7:44 AM   Result Value Ref Range    SODIUM 138 136 - 145 mmol/L    POTASSIUM 3.2 (L) 3.5 - 5.1 mmol/L    CHLORIDE 108 96 - 111 mmol/L    CO2 TOTAL 22 22 - 30 mmol/L    ANION GAP 8 4 - 13 mmol/L    CALCIUM  8.0 (L) 8.6 - 10.2 mg/dL    GLUCOSE 96 65 - 161 mg/dL    BUN 5 (L) 8 - 25 mg/dL    CREATININE 0.96 0.45 - 1.05 mg/dL    BUN/CREA RATIO 8 6 - 22    ESTIMATED GFR - FEMALE >90 >=60 mL/min/BSA   PHOSPHORUS    Collection Time: 06/22/23  7:44 AM   Result Value Ref Range    PHOSPHORUS 1.9 (L) 2.4 - 4.7 mg/dL   MAGNESIUM    Collection Time: 06/22/23  7:44 AM   Result Value Ref Range    MAGNESIUM 1.8 1.8 - 2.6 mg/dL   CBC WITH DIFF    Collection Time: 06/22/23  7:44 AM   Result Value Ref Range    WBC 8.6 3.7 - 11.0 x10^3/uL    RBC 3.15 (L) 3.85 - 5.22 x10^6/uL    HGB 10.2 (L) 11.5 - 16.0 g/dL    HCT 40.9 (L) 81.1 - 46.0 %    MCV 92.7 78.0 - 100.0 fL    MCH 32.4 (H) 26.0 - 32.0 pg    MCHC 34.9 31.0 - 35.5 g/dL    RDW-CV 91.4 78.2 - 95.6 %    PLATELETS 184 150 - 400 x10^3/uL    MPV 9.2 8.7 - 12.5 fL    NEUTROPHIL % 68.6 %     LYMPHOCYTE % 21.3 %    MONOCYTE % 6.4 %    EOSINOPHIL % 2.6 %    BASOPHIL % 0.5 %    NEUTROPHIL # 5.87 1.50 - 7.70 x10^3/uL    LYMPHOCYTE # 1.82 1.00 - 4.80 x10^3/uL    MONOCYTE # 0.55 0.20 - 1.10 x10^3/uL    EOSINOPHIL # 0.22 <=0.50 x10^3/uL    BASOPHIL # <0.10 <=0.20 x10^3/uL    IMMATURE GRANULOCYTE % 0.6 0.0 - 1.0 %    IMMATURE GRANULOCYTE # <0.10 <0.10 x10^3/uL       ATTESTATION:   Marcial Setting, DO    I was immediately available for this patient encounter.  I reviewed the resident's note.   I agree with the findings and plan of care as documented in the resident's note.   Any exceptions/additions are edited/noted.    CXR with left pleural effusion and atelectasis, c/w cough yesterday. Instructed on IS and cough improved today. Still minimally ambulatory. Not passing flatus regularly but having Bms. Making good urine output and renal function stable. Epidural to be removed today, followed by foley catheter. Transition to scheduled and PRN pain control. Ambulate TID. Continue floor care.     Bethel Brooms, MD  Attending, Department of Urology  [facility]  06/22/2023, 09:42

## 2023-06-22 NOTE — Consults (Signed)
 [facility]  ACUTE PAIN SERVICE PROGRESS NOTE    DATE OF SERVICE:  06/22/2023  DIAGNOSIS:  Acute post-operative pain, s/p NEPHRECTOMY (Left)  ADRENALECTOMY (Left: Kidney)      BLOCK PERFORMED:    Thoracic Epidural  Catheter placed 06/18/2023. Today is post-procedure day 4.    MEDICATION:  Ropivacaine  0.2% + Fentanyl  2mcg/ml @ 8cc/hr    PLAN:   - Removed epidural catheter per request of urology team, blue tip intact upon removal.  - Oral medications per primary service  - Will sign off at this time.  - Please call [phone] w/questions or concerns regarding Elizabeth [name]'s regional anesthesia management    Catheter Site Assessment:  Clean/dry/intact    PE:   Neuro: alert and cooperative  Resp: non-labored breathing    SUBJECTIVE:  She reports fair control of her pain (5/10).  When she has pain it is located diffusely in the abdomen.  She has no complaints related to her block.  Denies headache, perioral numbness, metallic taste, and tinnitus.    VITALS:   Temperature: 37 C (98.6 F)  Heart Rate: 84  BP (Non-Invasive): 135/71  Respiratory Rate: 18  SpO2: 97 %    Analgesics (last 24 hours)       Date/Time Action Medication Dose Rate    06/19/23 1214 Rate Change    fentaNYL  2 mcg/mL + ROPivacaine  0.2% in NS 250mL (tot vol) epidural infusion  6 mL/hr    06/19/23 1107 Rate Change    fentaNYL  2 mcg/mL + ROPivacaine  0.2% in NS 250mL (tot vol) epidural infusion 3 mL/hr 3 mL/hr    06/19/23 0856 Rate Change    fentaNYL  2 mcg/mL + ROPivacaine  0.2% in NS 250mL (tot vol) epidural infusion 3 mL/hr 3 mL/hr    06/19/23 0831 Rate Change    fentaNYL  2 mcg/mL + ROPivacaine  0.2% in NS 250mL (tot vol) epidural infusion 2 mL/hr 2 mL/hr    06/19/23 0813 Rate Change    fentaNYL  2 mcg/mL + ROPivacaine  0.2% in NS 250mL (tot vol) epidural infusion 4 mL/hr 4 mL/hr    06/19/23 0710 Rate Change    fentaNYL  2 mcg/mL + ROPivacaine  0.2% in NS 250mL (tot vol) epidural infusion 2 mL/hr 2 mL/hr    06/18/23 2017 Given    acetaminophen  (TYLENOL )  tablet 650 mg     06/18/23 1615 Given    acetaminophen  (TYLENOL ) tablet 650 mg     06/18/23 1354 New Bag/New Syringe    fentaNYL  2 mcg/mL + ROPivacaine  0.2% in NS 250mL (tot vol) epidural infusion 2 mL/hr 2 mL/hr            Anticoagulants/Antiplatelets (last 24 hours)       Date/Time Action Medication Dose    06/22/23 0633 Given    [Held by provider] heparin  5,000 unit/mL injection On hold since today at 0725 until manually unheld; held by Odelia Bender, Lorrin Rotter Reason: Held for Testing/Procedure 5,000 Units    06/21/23 2011 Given    [Held by provider] heparin  5,000 unit/mL injection On hold since today at 0725 until manually unheld; held by Odelia Bender, Lorrin Rotter Reason: Held for Testing/Procedure 5,000 Units            Liver/Pancreas Enzyme Results Liver Function Results   No results found for this or any previous visit (from the past 30 hours). No results found for this or any previous visit (from the past 30 hours).          BMP Results Other Chemistries Results  Results for orders placed or performed during the hospital encounter of 06/15/23 (from the past 30 hours)   BASIC METABOLIC PANEL    Collection Time: 06/22/23  7:44 AM   Result Value    SODIUM 138    POTASSIUM 3.2 (L)    CHLORIDE 108    CO2 TOTAL 22    BUN 5 (L)    CREATININE 0.63    Recent Results (from the past 30 hours)   MAGNESIUM    Collection Time: 06/22/23  7:44 AM   Result Value    MAGNESIUM 1.8   PHOSPHORUS    Collection Time: 06/22/23  7:44 AM   Result Value    PHOSPHORUS 1.9 (L)               Elizabeth Bernheim, MD 06/22/2023, 13:46   PGY-3/CA-2 Anesthesiology Resident      I saw and examined the patient.  I reviewed the resident's note.  I agree with the findings and plan of care as documented in the resident's note.  Any exceptions/additions are edited/noted.    [name], MD

## 2023-06-22 NOTE — Nurses Notes (Signed)
 Assumed care of patient at this time. Patient is a/o x4 and resting comfortably on 2lnc. Bed alarm on and activated. Call bell is in reach of patient.

## 2023-06-23 LAB — MAGNESIUM: MAGNESIUM: 1.9 mg/dL (ref 1.8–2.6)

## 2023-06-23 LAB — SURGICAL PATHOLOGY SPECIMEN
ADDITIONAL DIMENSION IN CENTIMETERS (CM): 10.6
ADDITIONAL DIMENSION IN CENTIMETERS (CM): 8.6
MARGIN STATUS: NEGATIVE
PERCENTAGE OF TUMOR NECROSIS: 5

## 2023-06-23 LAB — BASIC METABOLIC PANEL
ANION GAP: 10 mmol/L (ref 4–13)
BUN/CREA RATIO: 10 (ref 6–22)
BUN: 6 mg/dL — ABNORMAL LOW (ref 8–25)
CALCIUM: 8.3 mg/dL — ABNORMAL LOW (ref 8.6–10.2)
CHLORIDE: 105 mmol/L (ref 96–111)
CO2 TOTAL: 24 mmol/L (ref 22–30)
CREATININE: 0.59 mg/dL — ABNORMAL LOW (ref 0.60–1.05)
ESTIMATED GFR - FEMALE: >90 mL/min/BSA (ref >=60)
GLUCOSE: 99 mg/dL (ref 65–125)
POTASSIUM: 3.2 mmol/L — ABNORMAL LOW (ref 3.5–5.1)
SODIUM: 139 mmol/L (ref 136–145)

## 2023-06-23 LAB — CBC
HCT: 30.7 % — ABNORMAL LOW (ref 34.8–46.0)
HGB: 10.9 g/dL — ABNORMAL LOW (ref 11.5–16.0)
MCH: 32.6 pg — ABNORMAL HIGH (ref 26.0–32.0)
MCHC: 35.5 g/dL (ref 31.0–35.5)
MCV: 91.9 fL (ref 78.0–100.0)
MPV: 9 fL (ref 8.7–12.5)
PLATELETS: 233 10*3/uL (ref 150–400)
RBC: 3.34 10*6/uL — ABNORMAL LOW (ref 3.85–5.22)
RDW-CV: 13.2 % (ref 11.5–15.5)
WBC: 7.9 10*3/uL (ref 3.7–11.0)

## 2023-06-23 LAB — PHOSPHORUS: PHOSPHORUS: 2.9 mg/dL (ref 2.4–4.7)

## 2023-06-23 NOTE — Care Plan (Signed)
 [facility]  Rehabilitation Services  Physical Therapy Progress Note      Patient Name: Elizabeth [name]  Date of Birth: 04-01-1970  Height:  157.5 cm (5\' 2" )  Weight:  89.5 kg (197 lb 5 oz)  Room/Bed: 806/A  Payor: BLUE CROSS BLUE SHIELD / Plan: [place] [employer] BCBS PPO / Product Type: PPO /     Assessment:     Pt tolerated mobility fairly well this date. Limited by fatigue and only able to ambulate 10' before becoming fatigued and needing to sit. Requires A x 1 for all mobility as well as siginficant assist for bowel and bladder incontinence. Would benefit from cont care in IRF prior to return home to maximize safety and IND    Discharge Needs:   Equipment Recommendation: TBD  Discharge Disposition: inpatient rehabilitation facility    JUSTIFICATION OF DISCHARGE RECOMMENDATION   Based on current diagnosis, functional performance prior to admission, and current functional performance, this patient requires continued PT services in inpatient rehabilitation facility in order to achieve significant functional improvements in these deficit areas: aerobic capacity/endurance, ergonomics and body mechanics, gait, locomotion, and balance, muscle performance.      Plan:   Continue to follow patient according to established plan of care.  The risks/benefits of therapy have been discussed with the patient/caregiver and he/she is in agreement with the established plan of care.     Subjective & Objective:        06/23/23 1119   Therapist Pager   PT Assigned/ Pager # Wynona Hedger [phone]   Rehab Session   Document Type therapy progress note (daily note)   PT Visit Date 06/23/23   Total PT Minutes: 15   Patient Effort, Rehab Treatment Comment good   Symptoms Noted During/After Treatment fatigue   General Information   Patient Profile Reviewed yes   Medical Lines PIV Line;Telemetry   Respiratory Status room air   Existing Precautions/Restrictions fall precautions;full code   Mutuality/Individual Preferences   Individualized Care Needs OOB  FWW A x 1   Plan of Care Reviewed With patient   Pre Treatment Status   Pre Treatment Patient Status Call light within reach;Telephone within reach;Nurse approved session;Patient standing at bedside   Support Present Pre Treatment  Family present;Other (See comments)  (nursing students)   Communication Pre Treatment  Nurse   Cognition   Behavior/Mood Observations alert;cooperative   Attention mild impairment   Follows Commands follows one step commands   Vital Signs   O2 Delivery Pre Treatment room air   O2 Delivery Post Treatment room air   Pain Assessment   Pre/Posttreatment Pain Comment abd, did not rate   Transfer Assessment/Treatment   Sit-Stand [place] contact guard assist   Stand-Sit [place] contact guard assist   Sit-Stand-Sit, Assist Device walker, front wheeled   Toilet Transfer [place] minimum assist (75% patient effort)   Toilet Transfer Assist Device walker, front wheeled   Transfer Safety Issues balance decreased during turns   Transfer Impairments balance impaired;coordination impaired;endurance;strength decreased   Gait Assessment/Treatment   Total Distance Ambulated 20   [place]  contact guard assist   Assistive Device  walker, front wheeled   Distance in Feet 10+10 with FWW and CGA   Gait Speed diminished   Deviations  cadence decreased;increased trunk sway   Safety Issues  balance decreased during turns   Impairments  balance impaired;endurance   Balance   Comment with FWW   Sitting Balance: Static fair + balance   Sitting, Dynamic (Balance)  fair balance   Sit-to-Stand Balance fair - balance   Standing Balance: Static fair balance   Standing Balance: Dynamic fair - balance   Systems Impairment Contributing to Balance Disturbance musculoskeletal   Post Treatment Status   Post Treatment Patient Status Patient sitting in bedside chair or w/c;Call light within reach;Telephone within reach;Patient safety alarm activated   Support Present Post Treatment  Family present   Plan of Care  Review   Plan Of Care Reviewed With patient   Basic Mobility Am-PAC/6Clicks Score (APPROVED Staff)   Turning in bed without bedrails 2   Lying on back to sitting on edge of flat bed 2   Moving to and from a bed to a chair 3   Standing up from chair 3   Walk in room 3   Climbing 3-5 steps with railing 1   6 Clicks Raw Score total 14   Standardized (t-scale) score 35.55   Patient Mobility Goal (JHHLM) 7- Walk 25 feet or more 3X/day   Exercise/Activity Level Performed 6- Walked 10 steps or more   Physical Therapy Clinical Impression   Assessment Pt tolerated mobility fairly well this date. Limited by fatigue and only able to ambulate 10' before becoming fatigued and needing to sit. Requires A x 1 for all mobility as well as siginficant assist for bowel and bladder incontinence. Would benefit from cont care in IRF prior to return home to maximize safety and IND   Patient/Family Goals Statement get better   Criteria for Skilled Therapeutic yes;meets criteria;skilled treatment is necessary   Pathology/Pathophysiology Noted musculoskeletal   Impairments Found (describe specific impairments) aerobic capacity/endurance;ergonomics and body mechanics;gait, locomotion, and balance;muscle performance   Functional Limitations in Following  self-care;home management;community/leisure   Anticipated Equipment Needs at Discharge (PT) TBD   Anticipated Discharge Disposition inpatient rehabilitation facility       Therapist:   Sheralyn Dies, PT   Pager #: [phone]

## 2023-06-23 NOTE — Care Management Notes (Addendum)
 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]    Assessment:      06/23/23 0907   Assessment Details   Assessment Type Continued Assessment   Date of Care Management Update 06/23/23   Date of Next DCP Update 06/26/23   Care Management Plan   Discharge Planning Status plan in progress   Projected Discharge Date 06/25/23   CM will evaluate for rehabilitation potential yes   Discharge Needs Assessment   Equipment Needed After Discharge other (see comments)  (TBD)   Discharge Facility/Level of Care Needs Acute Rehab Placement/Return (not psych)(code 33)   Transportation Available family or friend will provide     Patient is ready for discharge per service. Referral sent to [facility] via [facility]. Family will transport her upon discharge. CCC will continue to follow.     Update 1420: [facility] willing to accept the patient and they submitted for insurance auth.    Discharge Plan:  Acute Rehab Placement/Return (not psych) (code 23)      The patient will continue to be evaluated for developing discharge needs.     Case Manager: [place], RN  Phone: [phone]

## 2023-06-23 NOTE — Progress Notes (Signed)
 [place]  [place] MEDICINE  UROLOGY PROGRESS NOTE   Patient: Elizabeth [name], Elizabeth [name], 53 y.o. female  Date of Admission:  06/15/2023  Date of Birth:  1970/05/11  Date of Service:  06/23/2023     ASSESSMENT:    53 y.o. female:    1. 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 Past medical history significant for diabetes, HTN, astrocytoma  3. Past surgical history significant for lap cholecystectomy  4. Blood thinners: None  5. ECOG: 1 - Ambulatory, restricted in strenuous activity, able to carry out work of a light or sedentary nature      PLAN:   - Updated PT/OT recommendations  - Continue regular diet   - Continue pulmonary toilet  - Encourage ambulation and IS  - Follow up in 2 weeks with BMP for wound assessment, review of pathology     Diet: Regular   Anticoagulation: SQH   Pain regimen: Tylenol , Robaxin , Norco, Dilaudid    Bowel regimen: Miralax    Foley catheter: -   Drain: N/a   Antibiotics: N/a   IV fluids: -   Microbiology: N/a   Imaging: N/a   PT/OT recs: Inpatient rehab      DISPOSITION: Floor status, anticipate discharge to home vs inpatient rehab today pending PT/OT recs     SUBJECTIVE:   NAEON. Resting in bed. Pain well controlled following epidural catheter removal. She has been able to void without issue following foley catheter removal. Continues to have bowel movements.      OBJECTIVE:   Temperature: 36.8 C (98.2 F)  Heart Rate: 82  BP (Non-Invasive): (!) 156/73  Respiratory Rate: 19  SpO2: 98 %  PHYSICAL EXAM:  General: 53 y.o. female not in acute distress.    Skin: Warm and dry.     Eyes: Eyes are clear.    Pulmonary: Respiratory effort is unlabored.     Psychiatric: Patient is alert, appropriate mood.    Cardiovascular: Palpable peripheral  pulses.    Neurologic: CN II-XII grossly intact, A&O x 3     Gastrointestinal: Soft, appropriately tender, non-distended. Left sided makuuchi incision well approximated, no erythema or swelling.    Genitourinary: Foley catheter draining clear yellow urine     Results for orders placed or performed during the hospital encounter of 06/15/23 (from the past 24 hours)   CBC/DIFF    Collection Time: 06/22/23  7:44 AM    Narrative    The following orders were created for panel order CBC/DIFF.  Procedure                               Abnormality         Status                     ---------                               -----------         ------                     CBC WITH RUEA[[id number]]  Abnormal            Final result                 Please view results for these tests on the individual orders.   BASIC METABOLIC PANEL    Collection Time: 06/22/23  7:44 AM   Result Value Ref Range    SODIUM 138 136 - 145 mmol/L    POTASSIUM 3.2 (L) 3.5 - 5.1 mmol/L    CHLORIDE 108 96 - 111 mmol/L    CO2 TOTAL 22 22 - 30 mmol/L    ANION GAP 8 4 - 13 mmol/L    CALCIUM  8.0 (L) 8.6 - 10.2 mg/dL    GLUCOSE 96 65 - 540 mg/dL    BUN 5 (L) 8 - 25 mg/dL    CREATININE 9.81 1.91 - 1.05 mg/dL    BUN/CREA RATIO 8 6 - 22    ESTIMATED GFR - FEMALE >90 >=60 mL/min/BSA   PHOSPHORUS    Collection Time: 06/22/23  7:44 AM   Result Value Ref Range    PHOSPHORUS 1.9 (L) 2.4 - 4.7 mg/dL   MAGNESIUM    Collection Time: 06/22/23  7:44 AM   Result Value Ref Range    MAGNESIUM 1.8 1.8 - 2.6 mg/dL   CBC WITH DIFF    Collection Time: 06/22/23  7:44 AM   Result Value Ref Range    WBC 8.6 3.7 - 11.0 x10^3/uL    RBC 3.15 (L) 3.85 - 5.22 x10^6/uL    HGB 10.2 (L) 11.5 - 16.0 g/dL    HCT 47.8 (L) 29.5 - 46.0 %    MCV 92.7 78.0 - 100.0 fL    MCH 32.4 (H) 26.0 - 32.0 pg    MCHC 34.9 31.0 - 35.5 g/dL    RDW-CV 62.1 30.8 - 65.7 %    PLATELETS 184 150 - 400 x10^3/uL    MPV 9.2 8.7 - 12.5 fL    NEUTROPHIL % 68.6 %    LYMPHOCYTE % 21.3 %    MONOCYTE % 6.4 %     EOSINOPHIL % 2.6 %    BASOPHIL % 0.5 %    NEUTROPHIL # 5.87 1.50 - 7.70 x10^3/uL    LYMPHOCYTE # 1.82 1.00 - 4.80 x10^3/uL    MONOCYTE # 0.55 0.20 - 1.10 x10^3/uL    EOSINOPHIL # 0.22 <=0.50 x10^3/uL    BASOPHIL # <0.10 <=0.20 x10^3/uL    IMMATURE GRANULOCYTE % 0.6 0.0 - 1.0 %    IMMATURE GRANULOCYTE # <0.10 <0.10 x10^3/uL   BASIC METABOLIC PANEL    Collection Time: 06/23/23  4:40 AM   Result Value Ref Range    SODIUM 139 136 - 145 mmol/L    POTASSIUM 3.2 (L) 3.5 - 5.1 mmol/L    CHLORIDE 105 96 - 111 mmol/L    CO2 TOTAL 24 22 - 30 mmol/L    ANION GAP 10 4 - 13 mmol/L    CALCIUM  8.3 (L) 8.6 - 10.2 mg/dL    GLUCOSE 99 65 - 846 mg/dL    BUN 6 (L) 8 - 25 mg/dL    CREATININE 9.62 (L) 0.60 - 1.05 mg/dL    BUN/CREA RATIO 10 6 - 22    ESTIMATED GFR - FEMALE >90 >=60 mL/min/BSA   PHOSPHORUS    Collection Time: 06/23/23  4:40 AM   Result Value Ref Range    PHOSPHORUS 2.9 2.4 - 4.7 mg/dL   MAGNESIUM    Collection Time: 06/23/23  4:40  AM   Result Value Ref Range    MAGNESIUM 1.9 1.8 - 2.6 mg/dL   CBC    Collection Time: 06/23/23  4:40 AM   Result Value Ref Range    WBC 7.9 3.7 - 11.0 x10^3/uL    RBC 3.34 (L) 3.85 - 5.22 x10^6/uL    HGB 10.9 (L) 11.5 - 16.0 g/dL    HCT 16.1 (L) 09.6 - 46.0 %    MCV 91.9 78.0 - 100.0 fL    MCH 32.6 (H) 26.0 - 32.0 pg    MCHC 35.5 31.0 - 35.5 g/dL    RDW-CV 04.5 40.9 - 81.1 %    PLATELETS 233 150 - 400 x10^3/uL    MPV 9.0 8.7 - 12.5 fL       ATTESTATION:   Marcial Setting, DO    I was immediately available for this patient encounter.  I reviewed the resident's note.   I agree with the findings and plan of care as documented in the resident's note.   Any exceptions/additions are edited/noted.    Mina Alter, MD  Assistant Professor, Surgeon   Division of Urologic Oncology  Department of Urology   [place]  [facility]

## 2023-06-23 NOTE — Nurses Notes (Signed)
 Patient's mother fell in the toilet while helping to clean up the patient after having a bowel movement. RN heard the fall and went in to assess, pt's mother was sitting on the shower floor, telling the RN that she is fine. She did admit hitting her head but insists on being okay. There is no wound or bruise on assess. Per the mother's request, RN helped the mother up off the floor and went ahead on helping the patient. RN called a rapid response and explained to the mother that its protocol and considering she hit her head during the fall. Rapid response came and brought her down to the ED for further assessment.

## 2023-06-23 NOTE — Care Plan (Signed)
 [facility]  Rehabilitation Services  Occupational Therapy Progress Note    Patient Name: Elizabeth [name]  Date of Birth: 06-09-1970  Height:  157.5 cm (5\' 2" )  Weight:  89.5 kg (197 lb 5 oz)  Room/Bed: 806/A  Payor: BLUE CROSS BLUE [employer]IELD / Plan: [place] [employer] PPO / Product Type: PPO /     Assessment:    Pt tolerated OT session fair this date. Pt completed OOB transfers with MIN-CGA and ambulated briefly at bedside using FWW with CGA. Pt requiring MOD A to complete toileting ADL secondary to deficits in endurance, balance, and generalized weakness. OT recommending continued intervention in acute rehab setting.      Discharge Needs:   Equipment Recommendation: to be determined  Discharge Disposition:  inpatient rehabilitation facility     JUSTIFICATION OF DISCHARGE RECOMMENDATION   Based on current diagnosis, functional performance prior to admission, and current functional performance, this patient requires continued OT services in inpatient rehabilitation facility in order to achieve significant functional improvements.    Plan:   Continue to follow patient according to established plan of care.  The risks/benefits of therapy have been discussed with the patient/caregiver and he/she is in agreement with the established plan of care.     Subjective & Objective:      06/23/23 1118   Therapist Pager   OT Assigned/ Pager # Jeanette Milks [phone]   Rehab Session   Document Type therapy progress note (daily note)   OT Visit Date 06/23/23   Total OT Minutes: 15   Patient Effort good   Symptoms Noted During/After Treatment fatigue   General Information   Patient Profile Reviewed yes   Medical Lines PIV Line;Telemetry   Respiratory Status room air   Existing Precautions/Restrictions fall precautions;full code   Pre Treatment Status   Pre Treatment Patient Status Patient standing at bedside   Support Present Pre Treatment  Family present;Other (See comments)  (nursing students)   Communication Pre Treatment  Nurse    Mutuality/Individual Preferences   Individualized Care Needs OOB with Ax1 FWW   Vital Signs   O2 Delivery Pre Treatment room air   O2 Delivery Post Treatment room air   Coping/Psychosocial   Observed Emotional State calm;cooperative   Verbalized Emotional State acceptance   Family/Support System   Family/Support Persons mother   Involvement in Care at bedside;attentive to patient;interacting with patient;participating in care   Coping/Psychosocial Response Interventions   Plan Of Care Reviewed With patient;mother   Cognition   Behavior/Mood Observations alert;cooperative;distractible   Attention mild impairment   Follows Commands follows one step commands   Mobility Assessment/Training   Mobility Comment Pt ambulated ~37ft x2 using FWW with CGA.   Transfer Assessment/Treatment   Sit-Stand Independence contact guard assist   Stand-Sit Independence contact guard assist   Sit-Stand-Sit, Assist Device walker, front wheeled   Toilet Transfer Independence minimum assist (75% patient effort)   Toilet Transfer Assist Device walker, front wheeled   Transfer Safety Issues balance decreased during turns;step length decreased;weight-shifting ability decreased   Transfer Impairments balance impaired;coordination impaired;endurance;strength decreased   Toileting Assessment/Training   Position sitting   ASSISTANCE REQUIRED  Perineal hygiene   Independence Level  moderate assist (50% patient effort)   Impairments balance impaired;coordination impaired;strength decreased;activity tolerance impaired   Balance   Sitting Balance: Static fair + balance   Sitting, Dynamic (Balance) fair balance   Sit-to-Stand Balance fair - balance   Standing Balance: Static fair balance   Standing Balance: Dynamic fair -  balance   Systems Impairment Contributing to Balance Disturbance musculoskeletal   Identified Impairments Contributing to Balance Disturbance impaired coordination;decreased strength   Post Treatment Status   Post Treatment Patient  Status Patient sitting in bedside chair or w/c;Call light within reach;Telephone within reach;Patient safety alarm activated   Support Present Post Treatment  Family present   Clinical Impression   Functional Level at Time of Session Pt tolerated OT session fair this date. Pt completed OOB transfers with MIN-CGA and ambulated briefly at bedside using FWW with CGA. Pt requiring MOD A to complete toileting ADL secondary to deficits in endurance, balance, and generalized weakness. OT recommending continued intervention in acute rehab setting.   Anticipated Equipment Needs at Discharge to be determined   Anticipated Discharge Disposition inpatient rehabilitation facility   Highest level of Mobility score   Exercise/Activity Level Performed 6- Walked 10 steps or more         Therapist:   Burnetta Cart, OT  Pager #: [phone]

## 2023-06-24 ENCOUNTER — Other Ambulatory Visit (HOSPITAL_COMMUNITY): Payer: Self-pay | Admitting: PHYSICAL MEDICINE AND REHABILITATION

## 2023-06-24 ENCOUNTER — Other Ambulatory Visit (HOSPITAL_COMMUNITY)
Admit: 2023-06-24 | Discharge: 2023-06-24 | Disposition: A | Discharge status: Home / Self Care | Payer: Self-pay | Attending: PHYSICAL MEDICINE AND REHABILITATION | Admitting: PHYSICAL MEDICINE AND REHABILITATION

## 2023-06-24 LAB — CBC
HCT: 31.9 % — ABNORMAL LOW (ref 34.8–46.0)
HGB: 11.1 g/dL — ABNORMAL LOW (ref 11.5–16.0)
MCH: 31.9 pg (ref 26.0–32.0)
MCHC: 34.8 g/dL (ref 31.0–35.5)
MCV: 91.7 fL (ref 78.0–100.0)
MPV: 8.7 fL (ref 8.7–12.5)
PLATELETS: 267 10*3/uL (ref 150–400)
RBC: 3.48 10*6/uL — ABNORMAL LOW (ref 3.85–5.22)
RDW-CV: 13.2 % (ref 11.5–15.5)
WBC: 8.2 10*3/uL (ref 3.7–11.0)

## 2023-06-24 LAB — URINALYSIS, MACROSCOPIC AND MICROSCOPIC W/CULTURE REFLEX - CLIENT CONSOLIDATED
BILIRUBIN: NEGATIVE mg/dL
BLOOD: NEGATIVE mg/dL
GLUCOSE: NEGATIVE mg/dL
KETONES: NEGATIVE mg/dL
LEUKOCYTES: 250 WBCs/uL — AB
NITRITE: NEGATIVE
PH: 6 (ref 5.0–9.0)
PROTEIN: 30 mg/dL — AB
SPECIFIC GRAVITY: 1.034 — ABNORMAL HIGH (ref 1.002–1.030)
UROBILINOGEN: NORMAL mg/dL

## 2023-06-24 LAB — URINALYSIS, MICROSCOPIC
NON-SQUAMOUS EPITHELIAL CELLS URINE: <1 /HPF (ref <=1)
RBCS: 1 /HPF (ref <4)
SQUAMOUS EPITHELIAL: 9 /HPF (ref <28)
WBCS: 9 /HPF — ABNORMAL HIGH (ref <6)

## 2023-06-24 LAB — BASIC METABOLIC PANEL
ANION GAP: 12 mmol/L (ref 4–13)
BUN/CREA RATIO: 13 (ref 6–22)
BUN: 8 mg/dL (ref 8–25)
CALCIUM: 8.7 mg/dL (ref 8.6–10.2)
CHLORIDE: 101 mmol/L (ref 96–111)
CO2 TOTAL: 24 mmol/L (ref 22–30)
CREATININE: 0.61 mg/dL (ref 0.60–1.05)
ESTIMATED GFR - FEMALE: >90 mL/min/BSA (ref >=60)
GLUCOSE: 99 mg/dL (ref 65–125)
POTASSIUM: 3.3 mmol/L — ABNORMAL LOW (ref 3.5–5.1)
SODIUM: 137 mmol/L (ref 136–145)

## 2023-06-24 LAB — PHOSPHORUS: PHOSPHORUS: 2.6 mg/dL (ref 2.4–4.7)

## 2023-06-24 LAB — URINALYSIS, MACROSCOPIC
BILIRUBIN: NEGATIVE mg/dL
BLOOD: NEGATIVE mg/dL
GLUCOSE: NEGATIVE mg/dL
KETONES: NEGATIVE mg/dL
LEUKOCYTES: 250 WBCs/uL — AB
NITRITE: NEGATIVE
PH: 6 (ref 5.0–9.0)
PROTEIN: 30 mg/dL — AB
SPECIFIC GRAVITY: 1.034 — ABNORMAL HIGH (ref 1.002–1.030)
UROBILINOGEN: NORMAL mg/dL

## 2023-06-24 LAB — MAGNESIUM: MAGNESIUM: 1.9 mg/dL (ref 1.8–2.6)

## 2023-06-24 MED ORDER — DOCUSATE SODIUM 100 MG CAPSULE
100.0000 mg | ORAL_CAPSULE | Freq: Two times a day (BID) | ORAL | Status: DC
Start: 2023-06-24 — End: 2023-07-16

## 2023-06-24 NOTE — Care Plan (Signed)
 [facility]  Rehabilitation Services  Occupational Therapy Progress Note    Patient Name: Elizabeth [name]  Date of Birth: 06-03-1970  Height:  157.5 cm (5\' 2" )  Weight:  89.5 kg (197 lb 5 oz)  Room/Bed: 806/A  Payor: BLUE CROSS BLUE SHIELD / Plan: [place] [employer] PPO / Product Type: PPO /     Assessment:    Pt tolerated OT session well this date. Pt progressing well able to demonstrate increased tolerance to ambulation this session. Pt required moderate increased time with second portion of ambulation secondary to fatigue with pt demonstrating decreased step length, weight shift, and mild attention deficits from external stimuli in hallway. Pt compensating with Ax1 and use of FWW to safely completing functional mobility and ADL tasks. OT recommending continued intervention in acute rehab setting.      Discharge Needs:   Equipment Recommendation: to be determined  Discharge Disposition:  inpatient rehabilitation facility     JUSTIFICATION OF DISCHARGE RECOMMENDATION   Based on current diagnosis, functional performance prior to admission, and current functional performance, this patient requires continued OT services in inpatient rehabilitation facility in order to achieve significant functional improvements.    Plan:   Continue to follow patient according to established plan of care.  The risks/benefits of therapy have been discussed with the patient/caregiver and he/she is in agreement with the established plan of care.     Subjective & Objective:      06/24/23 0825   Therapist Pager   OT Assigned/ Pager # Jeanette Milks [phone]   Rehab Session   Document Type therapy progress note (daily note)   OT Visit Date 06/24/23   Total OT Minutes: 12   Patient Effort good   Symptoms Noted During/After Treatment fatigue   General Information   Patient Profile Reviewed yes   Medical Lines PIV Line;Telemetry   Respiratory Status room air   Existing Precautions/Restrictions fall precautions;full code   Pre Treatment Status   Pre  Treatment Patient Status Patient supine in bed;Call light within reach;Telephone within reach;Patient safety alarm activated   Support Present Pre Treatment  Family present   Mutuality/Individual Preferences   Individualized Care Needs OOB with Ax1 FWW   Self-Care   Current Activity Tolerance moderate   Vital Signs   O2 Delivery Pre Treatment room air   O2 Delivery Post Treatment room air   Pain Assessment   Pre/Posttreatment Pain Comment no c/o pain   Coping/Psychosocial   Observed Emotional State calm;cooperative   Verbalized Emotional State acceptance   Family/Support System   Family/Support Persons mother   Involvement in Care at bedside   Coping/Psychosocial Response Interventions   Plan Of Care Reviewed With patient   Cognition   Behavior/Mood Observations alert;cooperative;distractible   Attention mild impairment;distractible;needs re-direction   Follows Commands follows one step commands;verbal cues/prompting required   Mobility Assessment/Training   Mobility Comment Pt ambulated ~65ft using FWW with CGA. Pt requiring moderate increased time for last portion of ambulation secondary to fatigue.   Bed Mobility   Supine-Sit Independence minimum assist (75% patient effort)   Sit to Supine, Independence not tested   Bed Mobility, Assistive Device Head of Bed Elevated   Safety Issues decreased use of arms for pushing/pulling;decreased use of legs for bridging/pushing   Impairments balance impaired;coordination impaired;postural control impaired;strength decreased   Transfer Assessment/Treatment   Sit-Stand Independence contact guard assist   Stand-Sit Independence contact guard assist   Sit-Stand-Sit, Assist Device walker, front wheeled   Transfer Safety Issues  balance decreased during turns;step length decreased;weight-shifting ability decreased   Transfer Impairments balance impaired;coordination impaired;endurance;strength decreased   Upper Body Dressing Assessment/Training   Position  sitting   DRESSING  ASSESSED [place]-pull over   Independence Level  moderate assist (50% patient effort)   Impairments  balance impaired;coordination impaired;strength decreased   Balance   Sitting Balance: Static fair + balance   Sitting, Dynamic (Balance) fair balance   Sit-to-Stand Balance fair balance   Standing Balance: Static fair balance   Standing Balance: Dynamic fair - balance   Systems Impairment Contributing to Balance Disturbance musculoskeletal   Identified Impairments Contributing to Balance Disturbance impaired coordination;decreased strength   Post Treatment Status   Post Treatment Patient Status Patient sitting in bedside chair or w/c;Call light within reach;Telephone within reach;Patient safety alarm activated   Support Present Post Treatment  Family present   Clinical Impression   Functional Level at Time of Session Pt tolerated OT session well this date. Pt progressing well able to demonstrate increased tolerance to ambulation this session. Pt required moderate increased time with second portion of ambulation secondary to fatigue with pt demonstrating decreased step length, weight shift, and mild attention deficits from external stimuli in hallway. Pt compensating with Ax1 and use of FWW to safely completing functional mobility and ADL tasks. OT recommending continued intervention in acute rehab setting.   Anticipated Equipment Needs at Discharge to be determined   Anticipated Discharge Disposition inpatient rehabilitation facility   Highest level of Mobility score   Exercise/Activity Level Performed 7- Walked 25 feet or more         Therapist:   Burnetta Cart, OT  Pager #: [phone]

## 2023-06-24 NOTE — Discharge Summary (Signed)
   [facility]                                              DISCHARGE SUMMARY      PATIENT NAME:  Elizabeth [name]  MRN:  [id number]  DOB:  Aug 09, 1970    ADMISSION DATE:  06/15/2023  DISCHARGE DATE:  06/24/2023    ATTENDING PHYSICIAN: Mina Alter, MD  PRIMARY CARE PHYSICIAN: [facility]    ADMISSION DIAGNOSIS:     1. 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 Past medical history significant for diabetes, HTN, astrocytoma  3. Past surgical history significant for lap cholecystectomy  4. Blood thinners: None  5. ECOG: 1 - Ambulatory, restricted in strenuous activity, able to carry out work of a light or sedentary nature     DISCHARGE DIAGNOSIS: Same  Active Hospital Problems   (*Primary Problem)    Diagnosis    *Kidney mass     Chronic  Problems    Left renal mass         Date Noted: 04/25/2023                                                       DISCHARGE MEDICATIONS:     Current Discharge Medication List        START taking these medications.        Details   docusate sodium  100 mg Capsule  Commonly known as: COLACE   100 mg, Oral, 2 TIMES DAILY  Refills: 0            CONTINUE these medications - NO CHANGES were made during your visit.        Details   atorvastatin  20 mg Tablet  Commonly known as: LIPITOR   20 mg, NIGHTLY  Refills: 0     CHROMIUM CHLORIDE ORAL   1 Tablet, Daily  Refills: 0     CINNAMON BARK EXTRACT ORAL   2 Capsules, Daily  Refills: 0     ergocalciferol (vitamin D2) 1,250 mcg (50,000 unit) Capsule  Commonly known as: DRISDOL   Take 1 Capsule (50,000 Units total) by mouth Every Sunday  Refills: 0     * escitalopram  oxalate 20 mg Tablet  Commonly known as: LEXAPRO    20 mg, Daily  Refills: 0     famotidine  20 mg Tablet  Commonly known as: PEPCID    20 mg, Daily  Refills: 0     FreeStyle Lancets 28 gauge Misc  Generic drug: lancets   TEST ONCE  DAILY  Refills: 0     FreeStyle Lite Meter Kit  Generic drug: Blood-Glucose Meter   EVERY DAY  Refills: 0     FreeStyle Lite Strips Strip  Generic drug: Blood Sugar Diagnostic   TEST EVERY DAY  Refills: 0     hydroCHLOROthiazide  25 mg Tablet  Commonly known as: HYDRODIURIL    25 mg, EVERY MORNING  Refills: 0     levothyroxine  25 mcg Tablet  Commonly known as: SYNTHROID    TAKE 1 TABLET ONCE A DAY IN THE MORNING 30 MINUTES BEFORE  MEALS OR ANY OTHER MEDICATIONS  Refills: 0     losartan  100 mg Tablet  Commonly known as: COZAAR    100 mg, Daily  Refills: 0     magnesium Oxide 420 mg Tablet   420 mg, Daily  Refills: 0     methocarbamoL  500 mg Tablet  Commonly known as: ROBAXIN    500 mg, 3 TIMES DAILY PRN  Refills: 0     phenytoin  sodium extended release 100 mg Capsule  Commonly known as: DILANTIN    TAKE 3 CAPSULES BY MOUTH TWICE A DAY  Refills: 0           * This list has 1 medication(s) that are the same as other medications prescribed for you. Read the directions carefully, and ask your doctor or other care provider to review them with you.                ASK your doctor about these medications.        Details   clobetasoL 0.05 % Solution  Commonly known as: CORMAX   APPLY TO SCALP EVERY DAY TWO WEEKS OUT OF THE MONTH OR LESS  Refills: 0     * escitalopram  oxalate 10 mg Tablet  Commonly known as: LEXAPRO    10 mg, NIGHTLY  Refills: 0     HYDROcodone -acetaminophen  5-325 mg Tablet  Commonly known as: NORCO   1 Tablet, Oral, EVERY 6 HOURS PRN  Qty: 12 Tablet  Refills: 0     ketoconazole 2 % Shampoo  Commonly known as: NIZORAL   PLEASE SEE ATTACHED FOR DETAILED DIRECTIONS  Refills: 0     ondansetron  4 mg Tablet, Rapid Dissolve  Commonly known as: ZOFRAN  ODT   4 mg, Oral, EVERY 8 HOURS PRN  Qty: 12 Tablet  Refills: 0     Ozempic 0.25 mg or 0.5 mg(2 mg/1.5 mL) Pen Injector  Generic drug: semaglutide   0.25 mg, EVERY 7 DAYS  Refills: 0     tamsulosin  0.4 mg Capsule  Commonly known as: FLOMAX    0.4 mg, Oral, EVERY EVENING AFTER  DINNER  Qty: 30 Capsule  Refills: 0           * This list has 1 medication(s) that are the same as other medications prescribed for you. Read the directions carefully, and ask your doctor or other care provider to review them with you.                  DISCHARGE INSTRUCTIONS:      DISCHARGE INSTRUCTION - MISC    No lifting greater than 10 pounds until follow up with your surgeon.     DISCHARGE INSTRUCTION - MISC    SIGNS AND SYMPTOMS OF INFECTION    Please watch your incision/wound site for the following signs of potential infection:    Increased redness or warmth at the incision site.  Drainage from the wound that may be foul smelling, cloudy, yellow or green in color.  Bulging or increased swelling at the incision site.  A temperature of more than 101.5 degrees F by mouth for 2 readings taken 4 hours apart.  A sudden increase in pain at the wound that is not relieved with pain medication.     DISCHARGE INSTRUCTION - MISC    GENERAL INSTRUCTIONS:  You may resume all your home medications unless specifically instructed otherwise.     Drink plenty of fluids, we recommend at least 8-12 glasses (2-3 liters) of water  a day, goal is  for urine to be clear. If you are unable to urinate over a period of 6 to 8 hours, please go to nearest hospital to be evaluated for possible urinary catheterization.     Call the the [facility]rology Office at [phone] if you have any questions, concerns, or to confirm future clinic appointments. Normal office hours are Monday through Friday from 8:00 AM to 5:00 PM. Go to the emergency room or call 911 if you develop acutely increased pain, persistent fever greater than 101.5 F taken orally twice over period of 4 hours, nausea/vomiting, or if your overall general condition worsens.     PAIN MEDICATION:  Do not drive while taking narcotic pain medication.   We encourage the use of laxatives while taking narcotic pain medication to prevent constipation.     INCISION CARE:  You may remove  dressing and shower 48 hours from the time of surgery.  Do not submerge incision in water  (no baths or pools for 6 weeks).   Allow soapy water  to run over incision. Do not scrub. Pat dry.   No strenuous activity. No lifting more than 10 lbs until cleared by your surgeon.     DISCHARGE INSTRUCTION - MISC    Resume home diet.       REASON FOR HOSPITALIZATION AND HOSPITAL COURSE:    53 y.o. female with above history who presented to [facility] for planned surgical intervention. 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 She was admitted to the Urology Service for pain control and close post-operative monitoring. The patient progressed well throughout her hospitalization and by post-operative day 9, she was tolerating a regular diet, her pain was controlled with PRN PO medication, and she was ambulating without difficulty. Based upon clinical findings and examination, she was deemed appropriate to discharge. The patient will be discharged with colace. The patient was instructed to call our [facility] Urology office at [phone] with any questions, concerns, or to confirm future appointments. The patient was instructed to call 911 or present to the emergency room if she develops increased pain, fever, nausea, vomiting, or if her overall generall condition worsens. Discharge instructions were reviewed with the patient and all questions were answered. Follow up in 2 weeks with BMP for wound assessment, review of pathology     DISCHARGE DISPOSITION:  IPR     cc: Primary Care Physician:  [facility]  [address]  [place] [address]    Verneda Golder, MD   [facility]  Department of Urology    06/24/2023, 11:07      I was immediately available for this  patient encounter.  I reviewed the resident's note.   I agree with the findings and plan of care as documented in the resident's note.   Any exceptions/additions are edited/noted.    Mina Alter, MD  Assistant Professor, Surgeon   Division of Urologic Oncology  Department of Urology   [place]  [facility]

## 2023-06-24 NOTE — Discharge Instructions (Addendum)
 Discharge Recommendations/ Plan:Discharge IO:NGEXB Rehab Placement/Return (not psych) (code 9)      Resources: Nursing to call report to [facility] [phone]. Room 214

## 2023-06-24 NOTE — Nurses Notes (Signed)
 Pt discharged to [facility] via personal vehicle. Pt's family is transporting pt. Pt agreeable to discharge. No questions or concerns at this time. Pt given AVS. VSS. Report to be called to [facility]

## 2023-06-24 NOTE — Care Management Notes (Signed)
 [facility]  Care Management Note    Patient Name: Elizabeth [name]  Date of Birth: August 15, 1970  Sex: female  Date/Time of Admission: 06/15/2023  7:10 AM  Room/Bed: 806/A  Payor: BLUE CROSS BLUE SHIELD / Plan: [place] [employer] BCBS PPO / Product Type: PPO /    LOS: 9 days   Primary Care Providers:  [facility]    Admitting Diagnosis:  Kidney mass [N28.89]    Assessment:      06/24/23 1000   Assessment Details   Assessment Type Continued Assessment   Date of Care Management Update 06/24/23   Date of Next DCP Update 06/27/23   Care Management Plan   Discharge Planning Status plan in progress   Projected Discharge Date 06/24/23   Discharge plan discussed with: Patient   CM will evaluate for rehabilitation potential yes   Patient choice offered to patient/family Yes   Form for patient choice reviewed/signed and on chart yes   Facility or Agency Preferences [facility]   Patient aware of possible cost for ambulance transport?  Yes   Discharge Needs Assessment   Discharge Facility/Level of Care Needs Acute Rehab Placement/Return (not psych)(code 59)   Transportation Available family or friend will provide   Discharge Information   Discharge Disposition acute rehab   Acute Rehab [facility], other  Jillyn Motto)   Discharge Date 06/24/23     Per service patient is ready for discharge. Referral sent to [facility] and auth submitted and approved. FOC form reviewed with patient and signed. Signed copy to department for scanning into chart. Number for nursing report placed in AVS. Bedside RN updated. Family to transport her to [facility].    Discharge Plan:  Acute Rehab Placement/Return (not psych) (code 70)      The patient will continue to be evaluated for developing discharge needs.     Case Manager: Olden Berke, RN  Phone: [phone]

## 2023-06-24 NOTE — Care Plan (Signed)
 [facility]  Rehabilitation Services  Physical Therapy Progress Note      Patient Name: Elizabeth [name]  Date of Birth: February 04, 1971  Height:  157.5 cm (5\' 2" )  Weight:  89.5 kg (197 lb 5 oz)  Room/Bed: 806/A  Payor: BLUE CROSS BLUE SHIELD / Plan: [place] [employer] BCBS PPO / Product Type: PPO /     Assessment:     Pt tolerated mobility well. Remains below baseline for IND and would benefit from cont care in IRF    Discharge Needs:   Equipment Recommendation: TBD  Discharge Disposition: inpatient rehabilitation facility    JUSTIFICATION OF DISCHARGE RECOMMENDATION   Based on current diagnosis, functional performance prior to admission, and current functional performance, this patient requires continued PT services in inpatient rehabilitation facility in order to achieve significant functional improvements in these deficit areas: aerobic capacity/endurance, ergonomics and body mechanics, gait, locomotion, and balance, muscle performance.      Plan:   Continue to follow patient according to established plan of care.  The risks/benefits of therapy have been discussed with the patient/caregiver and he/she is in agreement with the established plan of care.     Subjective & Objective:        06/24/23 3474   Therapist Pager   PT Assigned/ Pager # Wynona Hedger [phone]   Rehab Session   Document Type therapy progress note (daily note)   PT Visit Date 06/24/23   Total PT Minutes: 12   Patient Effort, Rehab Treatment Comment good   Symptoms Noted During/After Treatment fatigue;increased pain   General Information   Patient Profile Reviewed yes   Medical Lines PIV Line;Telemetry   Respiratory Status room air   Existing Precautions/Restrictions fall precautions;full code   Mutuality/Individual Preferences   Individualized Care Needs OOB FWW A x 1   Plan of Care Reviewed With patient   Self-Care   Current Activity Tolerance moderate   Pre Treatment Status   Pre Treatment Patient Status Patient supine in bed;Call light within reach;Telephone  within reach;Patient safety alarm activated;Nurse approved session   Support Present Pre Treatment  Family present   Communication Pre Treatment  Nurse   Cognition   Behavior/Mood Observations alert;cooperative   Attention WNL/WFL   Vital Signs   O2 Delivery Pre Treatment room air   O2 Delivery Post Treatment room air   Pain Assessment   Pre/Posttreatment Pain Comment no c/o   Bed Mobility   Supine-Sit Independence minimum assist (75% patient effort)   Safety Issues decreased use of arms for pushing/pulling;decreased use of legs for bridging/pushing;impaired trunk control for bed mobility   Impairments endurance;balance impaired;pain;strength decreased   Transfer Assessment/Treatment   Sit-Stand Independence contact guard assist   Stand-Sit Independence contact guard assist   Sit-Stand-Sit, Assist Device walker, front wheeled   Transfer Safety Issues balance decreased during turns   Transfer Impairments balance impaired;coordination impaired;endurance;strength decreased   Gait Assessment/Treatment   Total Distance Ambulated 50   Independence  contact guard assist   Assistive Device  walker, front wheeled   Distance in Feet 50   Safety Issues  balance decreased during turns   Impairments  balance impaired;endurance   Balance   Sitting Balance: Static fair + balance   Sitting, Dynamic (Balance) fair balance   Sit-to-Stand Balance fair balance   Standing Balance: Static fair balance   Standing Balance: Dynamic fair - balance   Systems Impairment Contributing to Balance Disturbance musculoskeletal   Post Treatment Status   Post Treatment Patient Status Patient  sitting in bedside chair or w/c;Call light within reach;Telephone within reach;Patient safety alarm activated   Support Present Post Treatment  Family present   Communication Post Treatement Nurse   Plan of Care Review   Plan Of Care Reviewed With patient   Basic Mobility Am-PAC/6Clicks Score (APPROVED Staff)   Turning in bed without bedrails 3   Lying on back to  sitting on edge of flat bed 3   Moving to and from a bed to a chair 3   Standing up from chair 3   Walk in room 3   Climbing 3-5 steps with railing 1   6 Clicks Raw Score total 16   Standardized (t-scale) score 38.32   Patient Mobility Goal (JHHLM) 7- Walk 25 feet or more 3X/day   Exercise/Activity Level Performed 7- Walked 25 feet or more   Physical Therapy Clinical Impression   Assessment Pt tolerated mobility well. Remains below baseline for IND and would benefit from cont care in IRF   Patient/Family Goals Statement get better   Criteria for Skilled Therapeutic yes;meets criteria;skilled treatment is necessary   Pathology/Pathophysiology Noted musculoskeletal   Impairments Found (describe specific impairments) aerobic capacity/endurance;ergonomics and body mechanics;gait, locomotion, and balance;muscle performance   Functional Limitations in Following  self-care;home management;community/leisure   Anticipated Equipment Needs at Discharge (PT) TBD   Anticipated Discharge Disposition inpatient rehabilitation facility       Therapist:   Sheralyn Dies, PT   Pager #: [phone]

## 2023-06-24 NOTE — Care Management Notes (Signed)
 Referral Information  ++++++ Placed Provider #1 ++++++  Case Manager: Olden Berke  Provider Type: Acute -Rehab  Provider Name: [facility]  Address:  [address]  [place], [place] [address]  Contact: Sofie Dunning    Phone: [phone] x  Fax:   Fax: [phone]

## 2023-06-24 NOTE — Progress Notes (Signed)
 [place]  [place] [facility]  UROLOGY PROGRESS NOTE   Patient: Elizabeth, [name], 53 y.o. female  Date of Admission:  06/15/2023  Date of Birth:  1971/01/11  Date of Service:  06/24/2023     ASSESSMENT:    53 y.o. female:    1. 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 Past medical history significant for diabetes, HTN, astrocytoma  3. Past surgical history significant for lap cholecystectomy  4. Blood thinners: None  5. ECOG: 1 - Ambulatory, restricted in strenuous activity, able to carry out work of a light or sedentary nature      PLAN:   -Patient is appropriate for discharge from Urology standpoint.  Awaiting placement for inpatient rehab  - Continue regular diet   - Continue pulmonary toilet  - Encourage ambulation and IS  - Transition to floor status  - Follow up in 2 weeks with BMP for wound assessment, review of pathology     Diet: Regular   Anticoagulation: SQH   Pain regimen: Tylenol , Robaxin , Norco, Dilaudid    Bowel regimen: Miralax    Foley catheter: -   Drain: N/a   Antibiotics: N/a   IV fluids: -   Microbiology: N/a   Imaging: N/a   PT/OT recs: Inpatient rehab      DISPOSITION: Floor status, anticipate discharge to inpatient rehab pending placement     SUBJECTIVE:   No acute events overnight.  Resting in bed.  She is in good spirits.  Tolerating regular diet without nausea vomiting.  Pain improving.  She continues to have bowel movements.     OBJECTIVE:   Temperature: 36.9 C (98.4 F)  Heart Rate: 90  BP (Non-Invasive): (!) 150/70  Respiratory Rate: 17  SpO2: 93 %  PHYSICAL EXAM:  General: 53 y.o. female not in acute distress.    Skin: Warm and dry.     Eyes: Eyes are clear.    Pulmonary: Respiratory effort is unlabored.     Psychiatric: Patient is  alert, appropriate mood.    Cardiovascular: Palpable peripheral pulses.    Neurologic: CN II-XII grossly intact, A&O x 3     Gastrointestinal: Soft, appropriately tender, non-distended. Left sided makuuchi incision well approximated, no erythema or swelling.    Genitourinary: Suprapubic area non-tender to palpation      Results for orders placed or performed during the hospital encounter of 06/15/23 (from the past 24 hours)   BASIC METABOLIC PANEL    Collection Time: 06/24/23  4:29 AM   Result Value Ref Range    SODIUM 137 136 - 145 mmol/L    POTASSIUM 3.3 (L) 3.5 - 5.1 mmol/L    CHLORIDE 101 96 - 111 mmol/L    CO2 TOTAL 24 22 - 30 mmol/L    ANION GAP 12 4 - 13 mmol/L    CALCIUM  8.7 8.6 - 10.2 mg/dL    GLUCOSE 99 65 - 161 mg/dL    BUN 8 8 - 25 mg/dL    CREATININE 0.96 0.45 - 1.05 mg/dL    BUN/CREA RATIO 13 6 - 22    ESTIMATED GFR - FEMALE >90 >=60 mL/min/BSA   PHOSPHORUS    Collection Time: 06/24/23  4:29 AM   Result Value Ref Range    PHOSPHORUS  2.6 2.4 - 4.7 mg/dL   MAGNESIUM    Collection Time: 06/24/23  4:29 AM   Result Value Ref Range    MAGNESIUM 1.9 1.8 - 2.6 mg/dL   CBC    Collection Time: 06/24/23  4:29 AM   Result Value Ref Range    WBC 8.2 3.7 - 11.0 x10^3/uL    RBC 3.48 (L) 3.85 - 5.22 x10^6/uL    HGB 11.1 (L) 11.5 - 16.0 g/dL    HCT 13.2 (L) 44.0 - 46.0 %    MCV 91.7 78.0 - 100.0 fL    MCH 31.9 26.0 - 32.0 pg    MCHC 34.8 31.0 - 35.5 g/dL    RDW-CV 10.2 72.5 - 36.6 %    PLATELETS 267 150 - 400 x10^3/uL    MPV 8.7 8.7 - 12.5 fL       ATTESTATION:   Marcial Setting, DO    I was immediately available for this patient encounter.  I reviewed the resident's note.   I agree with the findings and plan of care as documented in the resident's note.   Any exceptions/additions are edited/noted.    Mina Alter, MD  Assistant Professor, Surgeon   Division of Urologic Oncology  Department of Urology   [place]  [facility]

## 2023-06-24 NOTE — Care Plan (Signed)
 Plan of care reviewed with patient.Fall and Skin precautions maintained. Pt tolerating regular diet. Patient ambulates with 1+ walker assist. Patient's pain is controlled with scheduled. SKIN/WOUNDS abdominal incision. DC planning ongoing.    Problem: Adult Inpatient Plan of Care  Goal: Absence of Hospital-Acquired Illness or Injury  Intervention: Identify and Manage Fall Risk  Recent Flowsheet Documentation  Taken 06/24/2023 0200 by Aviva Boer, RN  Safety Promotion/Fall Prevention: safety round/check completed  Taken 06/24/2023 0000 by Aviva Boer, RN  Safety Promotion/Fall Prevention: safety round/check completed  Taken 06/23/2023 2200 by Aviva Boer, RN  Safety Promotion/Fall Prevention: safety round/check completed  Taken 06/23/2023 2000 by Aviva Boer, RN  Safety Promotion/Fall Prevention:   fall prevention program maintained   nonskid shoes/slippers when out of bed   safety round/check completed  Intervention: Prevent Skin Injury  Recent Flowsheet Documentation  Taken 06/23/2023 2000 by Aviva Boer, RN  Body Position: supine, head elevated  Intervention: Prevent and Manage VTE (Venous Thromboembolism) Risk  Recent Flowsheet Documentation  Taken 06/23/2023 2000 by Aviva Boer, RN  VTE Prevention/Management:   ambulation promoted   anticoagulant therapy maintained  Intervention: Prevent Infection  Recent Flowsheet Documentation  Taken 06/23/2023 2000 by Aviva Boer, RN  Infection Prevention:   barrier precautions utilized   promote handwashing   personal protective equipment utilized     Problem: Fall Injury Risk  Goal: Absence of Fall and Fall-Related Injury  Intervention: Identify and Manage Contributors  Recent Flowsheet Documentation  Taken 06/23/2023 2000 by Aviva Boer, RN  Self-Care Promotion:   independence encouraged   BADL personal objects within reach  Medication Review/Management: medications reviewed  Intervention: Promote Injury-Free Environment  Recent Flowsheet Documentation  Taken 06/24/2023 0200 by Aviva Boer, RN  Safety  Promotion/Fall Prevention: safety round/check completed  Taken 06/24/2023 0000 by Aviva Boer, RN  Safety Promotion/Fall Prevention: safety round/check completed  Taken 06/23/2023 2200 by Aviva Boer, RN  Safety Promotion/Fall Prevention: safety round/check completed  Taken 06/23/2023 2000 by Aviva Boer, RN  Safety Promotion/Fall Prevention:   fall prevention program maintained   nonskid shoes/slippers when out of bed   safety round/check completed

## 2023-06-25 ENCOUNTER — Other Ambulatory Visit (HOSPITAL_COMMUNITY): Payer: Self-pay | Admitting: PHYSICAL MEDICINE AND REHABILITATION

## 2023-06-25 LAB — COMPREHENSIVE METABOLIC PANEL, NON-FASTING
ALBUMIN/GLOBULIN RATIO: 1.5 — ABNORMAL HIGH (ref 0.8–1.4)
ALBUMIN: 3.7 g/dL (ref 3.5–5.7)
ALKALINE PHOSPHATASE: 80 U/L (ref 34–104)
ALT (SGPT): 32 U/L (ref 7–52)
ANION GAP: 9 mmol/L (ref 4–13)
AST (SGOT): 26 U/L (ref 13–39)
BILIRUBIN TOTAL: 0.4 mg/dL (ref 0.3–1.0)
BUN/CREA RATIO: 19 (ref 6–22)
BUN: 12 mg/dL (ref 7–25)
CALCIUM, CORRECTED: 9 mg/dL (ref 8.9–10.8)
CALCIUM: 8.8 mg/dL (ref 8.6–10.3)
CHLORIDE: 102 mmol/L (ref 98–107)
CO2 TOTAL: 29 mmol/L (ref 21–31)
CREATININE: 0.62 mg/dL (ref 0.60–1.30)
ESTIMATED GFR: 106 mL/min/{1.73_m2} (ref >59)
GLOBULIN: 2.4 (ref 2.0–3.5)
GLUCOSE: 102 mg/dL (ref 74–109)
OSMOLALITY, CALCULATED: 279 mosm/kg (ref 270–290)
POTASSIUM: 3.1 mmol/L — ABNORMAL LOW (ref 3.5–5.1)
PROTEIN TOTAL: 6.1 g/dL — ABNORMAL LOW (ref 6.4–8.9)
SODIUM: 140 mmol/L (ref 136–145)

## 2023-06-25 LAB — CBC
HCT: 30.8 % — ABNORMAL LOW (ref 31.2–41.9)
HGB: 11.1 g/dL (ref 10.9–14.3)
MCH: 32.6 pg (ref 24.7–32.8)
MCHC: 36.1 g/dL — ABNORMAL HIGH (ref 32.3–35.6)
MCV: 90.5 fL (ref 75.5–95.3)
MPV: 7.1 fL — ABNORMAL LOW (ref 7.9–10.8)
PLATELETS: 324 10*3/uL (ref 140–440)
RBC: 3.4 10*6/uL — ABNORMAL LOW (ref 3.63–4.92)
RDW: 13.5 % (ref 12.3–17.7)
WBC: 8.7 10*3/uL (ref 3.8–11.8)

## 2023-06-25 LAB — HGA1C (HEMOGLOBIN A1C WITH EST AVG GLUCOSE): HEMOGLOBIN A1C: 5.1 % (ref 4.0–6.0)

## 2023-06-27 LAB — URINE CULTURE,ROUTINE: URINE CULTURE: 5000 — AB

## 2023-06-28 ENCOUNTER — Other Ambulatory Visit (HOSPITAL_COMMUNITY): Payer: Self-pay | Admitting: PHYSICAL MEDICINE AND REHABILITATION

## 2023-06-28 LAB — POTASSIUM: POTASSIUM: 3.9 mmol/L (ref 3.5–5.1)

## 2023-07-10 NOTE — Addendum Note (Signed)
 Addendum  created 07/10/23 1321 by Elijio Guadeloupe, DO    [employer] properties accepted

## 2023-07-11 ENCOUNTER — Ambulatory Visit: Payer: Self-pay | Admitting: Family

## 2023-07-16 ENCOUNTER — Inpatient Hospital Stay (HOSPITAL_COMMUNITY)

## 2023-07-16 ENCOUNTER — Emergency Department (HOSPITAL_COMMUNITY)

## 2023-07-16 ENCOUNTER — Observation Stay: Admission: EM | Admit: 2023-07-16 | Discharge: 2023-07-19 | Disposition: A | Discharge status: Home / Self Care

## 2023-07-16 ENCOUNTER — Other Ambulatory Visit: Payer: Self-pay

## 2023-07-16 ENCOUNTER — Encounter (HOSPITAL_COMMUNITY): Payer: Self-pay | Admitting: HOSPITALIST

## 2023-07-16 DIAGNOSIS — I10 Essential (primary) hypertension: Secondary | ICD-10-CM | POA: Diagnosis present

## 2023-07-16 DIAGNOSIS — I69320 Aphasia following cerebral infarction: Secondary | ICD-10-CM

## 2023-07-16 DIAGNOSIS — Z85528 Personal history of other malignant neoplasm of kidney: Secondary | ICD-10-CM

## 2023-07-16 DIAGNOSIS — G40909 Epilepsy, unspecified, not intractable, without status epilepticus: Secondary | ICD-10-CM | POA: Diagnosis present

## 2023-07-16 DIAGNOSIS — R0600 Dyspnea, unspecified: Secondary | ICD-10-CM

## 2023-07-16 DIAGNOSIS — J9811 Atelectasis: Secondary | ICD-10-CM

## 2023-07-16 DIAGNOSIS — R0789 Other chest pain: Secondary | ICD-10-CM | POA: Insufficient documentation

## 2023-07-16 DIAGNOSIS — C642 Malignant neoplasm of left kidney, except renal pelvis: Secondary | ICD-10-CM | POA: Insufficient documentation

## 2023-07-16 DIAGNOSIS — E119 Type 2 diabetes mellitus without complications: Secondary | ICD-10-CM | POA: Diagnosis present

## 2023-07-16 DIAGNOSIS — Z905 Acquired absence of kidney: Secondary | ICD-10-CM

## 2023-07-16 DIAGNOSIS — Z859 Personal history of malignant neoplasm, unspecified: Secondary | ICD-10-CM

## 2023-07-16 DIAGNOSIS — Z79899 Other long term (current) drug therapy: Secondary | ICD-10-CM

## 2023-07-16 DIAGNOSIS — E039 Hypothyroidism, unspecified: Secondary | ICD-10-CM | POA: Diagnosis present

## 2023-07-16 DIAGNOSIS — Z85841 Personal history of malignant neoplasm of brain: Secondary | ICD-10-CM

## 2023-07-16 DIAGNOSIS — Z7985 Long-term (current) use of injectable non-insulin antidiabetic drugs: Secondary | ICD-10-CM

## 2023-07-16 DIAGNOSIS — I2699 Other pulmonary embolism without acute cor pulmonale: Principal | ICD-10-CM | POA: Diagnosis present

## 2023-07-16 DIAGNOSIS — Z79890 Hormone replacement therapy (postmenopausal): Secondary | ICD-10-CM

## 2023-07-16 DIAGNOSIS — R918 Other nonspecific abnormal finding of lung field: Secondary | ICD-10-CM

## 2023-07-16 DIAGNOSIS — R079 Chest pain, unspecified: Secondary | ICD-10-CM

## 2023-07-16 HISTORY — DX: Other pulmonary embolism without acute cor pulmonale: I26.99

## 2023-07-16 LAB — CBC WITH DIFF
BASOPHIL #: 0 10*3/uL (ref 0.00–0.10)
BASOPHIL %: 1 % (ref 0–1)
EOSINOPHIL #: 0.4 10*3/uL (ref 0.00–0.50)
EOSINOPHIL %: 5 % (ref 1–7)
HCT: 36.4 % (ref 31.2–41.9)
HGB: 12.7 g/dL (ref 10.9–14.3)
LYMPHOCYTE #: 2.1 10*3/uL (ref 1.10–3.10)
LYMPHOCYTE %: 26 % (ref 16–46)
MCH: 32.6 pg (ref 24.7–32.8)
MCHC: 34.8 g/dL (ref 32.3–35.6)
MCV: 93.4 fL (ref 75.5–95.3)
MONOCYTE #: 0.6 10*3/uL (ref 0.20–0.90)
MONOCYTE %: 7 % (ref 4–11)
MPV: 7.4 fL — ABNORMAL LOW (ref 7.9–10.8)
NEUTROPHIL #: 4.9 10*3/uL (ref 1.90–8.20)
NEUTROPHIL %: 61 % (ref 43–77)
PLATELETS: 233 10*3/uL (ref 140–440)
RBC: 3.89 10*6/uL (ref 3.63–4.92)
RDW: 14 % (ref 12.3–17.7)
WBC: 7.9 10*3/uL (ref 3.8–11.8)

## 2023-07-16 LAB — POC BLOOD GLUCOSE (RESULTS): GLUCOSE, POC: 115 mg/dL — ABNORMAL HIGH (ref 70–100)

## 2023-07-16 LAB — COMPREHENSIVE METABOLIC PANEL, NON-FASTING
ALBUMIN/GLOBULIN RATIO: 2 — ABNORMAL HIGH (ref 0.8–1.4)
ALBUMIN: 4.3 g/dL (ref 3.5–5.7)
ALKALINE PHOSPHATASE: 121 U/L — ABNORMAL HIGH (ref 34–104)
ALT (SGPT): 20 U/L (ref 7–52)
ANION GAP: 5 mmol/L (ref 4–13)
AST (SGOT): 15 U/L (ref 13–39)
BILIRUBIN TOTAL: 0.3 mg/dL (ref 0.3–1.0)
BUN/CREA RATIO: 12 (ref 6–22)
BUN: 11 mg/dL (ref 7–25)
CALCIUM, CORRECTED: 9 mg/dL (ref 8.9–10.8)
CALCIUM: 9.2 mg/dL (ref 8.6–10.3)
CHLORIDE: 104 mmol/L (ref 98–107)
CO2 TOTAL: 31 mmol/L (ref 21–31)
CREATININE: 0.92 mg/dL (ref 0.60–1.30)
ESTIMATED GFR: 74 mL/min/{1.73_m2} (ref >59)
GLOBULIN: 2.2 (ref 2.0–3.5)
GLUCOSE: 148 mg/dL — ABNORMAL HIGH (ref 74–109)
OSMOLALITY, CALCULATED: 282 mosm/kg (ref 270–290)
POTASSIUM: 3.6 mmol/L (ref 3.5–5.1)
PROTEIN TOTAL: 6.5 g/dL (ref 6.4–8.9)
SODIUM: 140 mmol/L (ref 136–145)

## 2023-07-16 LAB — CBC
HCT: 35.7 % (ref 31.2–41.9)
HGB: 12.7 g/dL (ref 10.9–14.3)
MCH: 32.8 pg (ref 24.7–32.8)
MCHC: 35.5 g/dL (ref 32.3–35.6)
MCV: 92.4 fL (ref 75.5–95.3)
MPV: 7.2 fL — ABNORMAL LOW (ref 7.9–10.8)
PLATELETS: 228 10*3/uL (ref 140–440)
RBC: 3.86 10*6/uL (ref 3.63–4.92)
RDW: 14 % (ref 12.3–17.7)
WBC: 9.4 10*3/uL (ref 3.8–11.8)

## 2023-07-16 LAB — GOLD TOP TUBE

## 2023-07-16 LAB — PHOSPHORUS: PHOSPHORUS: 4 mg/dL (ref 3.7–7.2)

## 2023-07-16 LAB — MAGNESIUM: MAGNESIUM: 2 mg/dL (ref 1.9–2.7)

## 2023-07-16 LAB — PTT (PARTIAL THROMBOPLASTIN TIME)
APTT: 27.3 s (ref 25.0–38.0)
APTT: 73.8 s — ABNORMAL HIGH (ref 25.0–38.0)

## 2023-07-16 LAB — GRAY TOP TUBE

## 2023-07-16 LAB — TROPONIN-I
TROPONIN I: 3 ng/L (ref <15)
TROPONIN I: 4 ng/L (ref <15)
TROPONIN I: 4 ng/L (ref <15)

## 2023-07-16 LAB — BLUE TOP TUBE

## 2023-07-16 LAB — B-TYPE NATRIURETIC PEPTIDE (BNP),PLASMA: BNP: 10 pg/mL (ref 1–100)

## 2023-07-16 MED ORDER — HEPARIN (PORCINE) 5,000 UNIT/ML INJECTION SOLUTION
INTRAMUSCULAR | Status: AC
Start: 2023-07-16 — End: 2023-07-16
  Filled 2023-07-16: qty 1

## 2023-07-16 MED ORDER — ESCITALOPRAM 10 MG TABLET
ORAL_TABLET | ORAL | Status: AC
Start: 2023-07-16 — End: 2023-07-16
  Filled 2023-07-16: qty 2

## 2023-07-16 MED ORDER — HEPARIN (PORCINE) 5,000 UNITS/ML BOLUS
80.0000 [IU]/kg | Freq: Once | INTRAMUSCULAR | Status: AC
Start: 2023-07-16 — End: 2023-07-16
  Administered 2023-07-16: 5500 [IU] via INTRAVENOUS

## 2023-07-16 MED ORDER — MORPHINE 2 MG/ML INJECTION WRAPPER
1.0000 mg | INJECTION | INTRAMUSCULAR | Status: DC | PRN
Start: 2023-07-16 — End: 2023-07-17
  Administered 2023-07-16: 1 mg via INTRAVENOUS
  Filled 2023-07-16 (×2): qty 1

## 2023-07-16 MED ORDER — SODIUM CHLORIDE 0.9 % INTRAVENOUS SOLUTION
INTRAVENOUS | Status: DC
Start: 2023-07-16 — End: 2023-07-16

## 2023-07-16 MED ORDER — INSULIN LISPRO 100 UNIT/ML SUB-Q SSIP VIAL
3.0000 [IU] | INJECTION | Freq: Four times a day (QID) | SUBCUTANEOUS | Status: DC
Start: 2023-07-16 — End: 2023-07-19
  Administered 2023-07-16 – 2023-07-19 (×11): 0 [IU] via SUBCUTANEOUS

## 2023-07-16 MED ORDER — GLUCAGON HCL 1 MG/ML SOLUTION FOR INJECTION
1.0000 mg | Freq: Once | INTRAMUSCULAR | Status: DC | PRN
Start: 2023-07-16 — End: 2023-07-19

## 2023-07-16 MED ORDER — SEMAGLUTIDE 0.25 MG OR 0.5 MG (2 MG/1.5 ML) SUBCUTANEOUS PEN INJECTOR
0.5000 mg | PEN_INJECTOR | SUBCUTANEOUS | Status: DC
Start: 2023-07-17 — End: 2023-07-16

## 2023-07-16 MED ORDER — HYDROCHLOROTHIAZIDE 25 MG TABLET
25.0000 mg | ORAL_TABLET | Freq: Every morning | ORAL | Status: DC
Start: 2023-07-17 — End: 2023-07-19

## 2023-07-16 MED ORDER — MAGNESIUM OXIDE 400 MG (241.3 MG MAGNESIUM) TABLET
400.0000 mg | ORAL_TABLET | Freq: Every day | ORAL | Status: DC
Start: 2023-07-16 — End: 2023-07-19
  Administered 2023-07-16 – 2023-07-19 (×4): 400 mg via ORAL
  Filled 2023-07-16 (×3): qty 1

## 2023-07-16 MED ORDER — PHENYTOIN SODIUM EXTENDED 100 MG CAPSULE
ORAL_CAPSULE | ORAL | Status: AC
Start: 2023-07-16 — End: 2023-07-16
  Filled 2023-07-16: qty 3

## 2023-07-16 MED ORDER — LEVOTHYROXINE 50 MCG TABLET
25.0000 ug | ORAL_TABLET | Freq: Every morning | ORAL | Status: DC
Start: 2023-07-17 — End: 2023-07-19
  Administered 2023-07-17 – 2023-07-19 (×3): 25 ug via ORAL
  Filled 2023-07-16 (×3): qty 1

## 2023-07-16 MED ORDER — FENTANYL (PF) 50 MCG/ML INJECTION SOLUTION
50.0000 ug | INTRAMUSCULAR | Status: AC
Start: 2023-07-16 — End: 2023-07-16
  Administered 2023-07-16: 50 ug via INTRAVENOUS

## 2023-07-16 MED ORDER — METHOCARBAMOL 500 MG TABLET
500.0000 mg | ORAL_TABLET | Freq: Three times a day (TID) | ORAL | Status: DC | PRN
Start: 2023-07-16 — End: 2023-07-19
  Filled 2023-07-16: qty 1

## 2023-07-16 MED ORDER — IOHEXOL 350 MG IODINE/ML INTRAVENOUS SOLUTION
75.0000 mL | INTRAVENOUS | Status: AC
Start: 2023-07-16 — End: 2023-07-16
  Administered 2023-07-16: 75 mL via INTRAVENOUS

## 2023-07-16 MED ORDER — DEXTROSE 50 % IN WATER (D50W) INTRAVENOUS SYRINGE
12.5000 g | INJECTION | INTRAVENOUS | Status: DC | PRN
Start: 2023-07-16 — End: 2023-07-19

## 2023-07-16 MED ORDER — ATORVASTATIN 20 MG TABLET
20.0000 mg | ORAL_TABLET | Freq: Every evening | ORAL | Status: DC
Start: 2023-07-16 — End: 2023-07-19
  Administered 2023-07-16 – 2023-07-18 (×3): 20 mg via ORAL
  Filled 2023-07-16 (×3): qty 1

## 2023-07-16 MED ORDER — LOSARTAN 50 MG TABLET
100.0000 mg | ORAL_TABLET | Freq: Every day | ORAL | Status: DC
Start: 2023-07-16 — End: 2023-07-19

## 2023-07-16 MED ORDER — POLYETHYLENE GLYCOL 3350 17 GRAM ORAL POWDER PACKET
ORAL | Status: AC
Start: 2023-07-16 — End: 2023-07-16
  Filled 2023-07-16: qty 1

## 2023-07-16 MED ORDER — ACETAMINOPHEN 325 MG TABLET
650.0000 mg | ORAL_TABLET | ORAL | Status: DC | PRN
Start: 2023-07-16 — End: 2023-07-19
  Administered 2023-07-17 – 2023-07-18 (×3): 650 mg via ORAL
  Filled 2023-07-16 (×3): qty 2

## 2023-07-16 MED ORDER — PHENYTOIN SODIUM EXTENDED 100 MG CAPSULE
300.0000 mg | ORAL_CAPSULE | Freq: Every day | ORAL | Status: DC
Start: 2023-07-16 — End: 2023-07-19
  Administered 2023-07-16 – 2023-07-19 (×4): 300 mg via ORAL
  Filled 2023-07-16 (×3): qty 3

## 2023-07-16 MED ORDER — ONDANSETRON HCL (PF) 4 MG/2 ML INJECTION SOLUTION
4.0000 mg | Freq: Four times a day (QID) | INTRAMUSCULAR | Status: DC | PRN
Start: 2023-07-16 — End: 2023-07-19

## 2023-07-16 MED ORDER — POLYETHYLENE GLYCOL 3350 17 GRAM ORAL POWDER PACKET
17.0000 g | Freq: Every day | ORAL | Status: DC
Start: 2023-07-16 — End: 2023-07-19
  Administered 2023-07-16: 17 g via ORAL
  Administered 2023-07-17 – 2023-07-19 (×3): 0 g via ORAL
  Filled 2023-07-16 (×5): qty 1

## 2023-07-16 MED ORDER — SODIUM CHLORIDE 0.9 % INTRAVENOUS SOLUTION
INTRAVENOUS | Status: DC
Start: 2023-07-16 — End: 2023-07-17
  Administered 2023-07-17: 0 mL via INTRAVENOUS

## 2023-07-16 MED ORDER — FAMOTIDINE 20 MG TABLET
ORAL_TABLET | ORAL | Status: AC
Start: 2023-07-16 — End: 2023-07-16
  Filled 2023-07-16: qty 1

## 2023-07-16 MED ORDER — ESCITALOPRAM 10 MG TABLET
20.0000 mg | ORAL_TABLET | Freq: Every day | ORAL | Status: DC
Start: 2023-07-16 — End: 2023-07-19
  Administered 2023-07-16 – 2023-07-19 (×4): 20 mg via ORAL
  Filled 2023-07-16 (×3): qty 2

## 2023-07-16 MED ORDER — HEPARIN (PORCINE) 25,000 UNIT/250 ML (100 UNIT/ML) IN DEXTROSE 5 % IV
INTRAVENOUS | Status: AC
Start: 2023-07-16 — End: 2023-07-16
  Filled 2023-07-16: qty 250

## 2023-07-16 MED ORDER — DEXTROSE 40 % ORAL GEL
15.0000 g | ORAL | Status: DC | PRN
Start: 2023-07-16 — End: 2023-07-19

## 2023-07-16 MED ORDER — FAMOTIDINE 20 MG TABLET
20.0000 mg | ORAL_TABLET | Freq: Every day | ORAL | Status: DC
Start: 2023-07-16 — End: 2023-07-19
  Administered 2023-07-16 – 2023-07-19 (×4): 20 mg via ORAL
  Filled 2023-07-16 (×3): qty 1

## 2023-07-16 MED ORDER — MAGNESIUM OXIDE 400 MG (241.3 MG MAGNESIUM) TABLET
ORAL_TABLET | ORAL | Status: AC
Start: 2023-07-16 — End: 2023-07-16
  Filled 2023-07-16: qty 1

## 2023-07-16 MED ORDER — HEPARIN (PORCINE) 25,000 UNIT/250 ML (100 UNIT/ML) IN DEXTROSE 5 % IV
18.0000 [IU]/kg/h | INTRAVENOUS | Status: DC
Start: 2023-07-16 — End: 2023-07-18
  Administered 2023-07-16 – 2023-07-17 (×4): 18 [IU]/kg/h via INTRAVENOUS
  Administered 2023-07-18: 0 [IU]/kg/h via INTRAVENOUS
  Filled 2023-07-16: qty 250

## 2023-07-16 MED ORDER — FENTANYL (PF) 50 MCG/ML INJECTION SOLUTION
INTRAMUSCULAR | Status: AC
Start: 2023-07-16 — End: 2023-07-16
  Filled 2023-07-16: qty 2

## 2023-07-16 NOTE — ED Provider Notes (Signed)
 [facility]  ED Primary Provider Note  Patient Name: Elizabeth [name]  Patient Age: 53 y.o.  Date of Birth: 13-May-1970    Chief Complaint: Chest Pain         History of Present Illness       Elizabeth [name] is a 53 y.o. female who had concerns including Chest Pain .  This patient is a 53 year old female who presents pain, which pleuritic in nature.  The location is reported to be substernal.  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  Her nephrectomy occurred approximately 1 month prior.        Review of Systems     No other overt Review of Systems are noted to be positive except noted in the HPI.      Historical Data   History Reviewed This Encounter: Medical History  Surgical History  Family History  Social History      Physical Exam   ED Triage Vitals [07/16/23 1248]   BP (Non-Invasive) (!) 166/86   Heart Rate 90   Respiratory Rate 16   Temperature 36.1 C (97 F)   SpO2 98 %   Weight 89.4 kg (197 lb)   Height 1.575 m (5' 2)         Nursing notes reviewed for what could be assessed. Past Medical, Surgical, and Social history reviewed for what has been completed.     Constitutional: NAD. Well-Developed. Well Nourished.  Head: Normocephalic, atraumatic.  Mouth/Throat:  Symmetric facial movement.  Eyes: EOM grossly intact, conjunctiva normal.  Neck: Supple  Cardiovascular:  Non tachycardic Rate and Rhythm, extremities well perfused.  Pulmonary/Chest: No respiratory distress.  No rhonchi.  No reproducible chest discomfort with the stethoscope.  Abdominal: Soft, non-tender, non-distended. Non peritoneal, no rebound, no guarding.  MSK: No Lower Extremity Edema.  Skin: Warm, dry.  Neuro: Appropriate, CN II-XII grossly intact.   Psych: Pleasant            Procedures      Patient Data     Labs Ordered/Reviewed   COMPREHENSIVE METABOLIC PANEL, NON-FASTING - Abnormal; Notable for the following components:       Result Value    GLUCOSE 148 (*)     ALKALINE PHOSPHATASE 121  (*)     ALBUMIN /GLOBULIN RATIO 2.0 (*)     All other components within normal limits    Narrative:     Estimated Glomerular Filtration Rate (eGFR) is calculated using the CKD-EPI (2021) equation, intended for patients 54 years of age and older. If gender is not documented or unknown, there will be no eGFR calculation.     CBC WITH DIFF - Abnormal; Notable for the following components:    MPV 7.4 (*)     All other components within normal limits   CBC - Abnormal; Notable for the following components:    MPV 7.2 (*)     All other components within normal limits   TROPONIN-I - Normal   TROPONIN-I - Normal   TROPONIN-I - Normal   B-TYPE NATRIURETIC PEPTIDE (BNP),PLASMA - Normal    Narrative:                                 Class 1: 101-250 pg/mL  Class 2: 251-550 pg/mL                              Class 3: 551-900 pg/mL                              Class 4: >901 pg/mL     The [place]  [employer] has developed a four-stage functional classification system for CHF that is based on a subjective interpretation of the severity of a patient's clinical signs and symptoms.    Class 1 - Patients have no limitations on physical activity and have no symptoms with ordinary physical activity.    Class 2 - Patients have a slight limitation of physical activity and have symptoms with ordinary physical activity.    Class 3 - Patients have a marked limitation of physical activity and have symptoms with less than ordinary physical activity, but not at rest.    Class 4 - Patients are unable to perform any physical activity without discomfort.   PTT (PARTIAL THROMBOPLASTIN TIME) - Normal   MAGNESIUM  - Normal   PHOSPHORUS - Normal   CBC/DIFF    Narrative:     The following orders were created for panel order CBC/DIFF.  Procedure                               Abnormality         Status                     ---------                               -----------         ------                     CBC WITH  DIFF[[id number]]                Abnormal            Final result                 Please view results for these tests on the individual orders.   EXTRA TUBES    Narrative:     The following orders were created for panel order EXTRA TUBES.  Procedure                               Abnormality         Status                     ---------                               -----------         ------                     BLUE TOP [id number]]                                    Final result  GOLD TOP [id number]]                                    Final result               GRAY TOP [id number]                                    Final result                 Please view results for these tests on the individual orders.   BLUE TOP TUBE   GOLD TOP TUBE   GRAY TOP TUBE   PTT (PARTIAL THROMBOPLASTIN TIME)   PERFORM POC WHOLE BLOOD GLUCOSE       CT ANGIO CHEST W IV CONTRAST   Final Result by Edi, Radresults In (06/11 1559)   Acute pulmonary emboli in segmental and subsegmental left lower lobe branches. No evidence for right heart strain. No thoracic aortic dissection. Critical findings discussed with Dr. Lawrance Presume by myself at the time of this dictation.      Noncalcified pulmonary nodules again noted bilaterally stable from recent PET/CT measuring up to 1 cm in the right middle lobe. New patchy atelectasis the left lung base with elevation of left hemidiaphragm now noted.               Radiologist location ID: [id number]             Medical Decision Making          Medical Decision Making          Studies Assessed:  Lab, EKG, radiology    EKG:   This independent EKG interpretation by that has noted below, this will be reviewed and documented separately by a cardiologist in the EMR.:    Rate:  89 beats per minute    Interpretation:  PR 156, No consistent ST Elevation, No Acute STEMI Identified.      MDM Narrative:  This patient is a 53 year old female who presents with pleuritic substernal chest pain.  She  recently underwent a nephrectomy secondary to malignancy.  Patient is higher risk for PE which is considered, other differential includes costochondritis, acute coronary syndrome.  Troponin is not acutely elevated.  The patient has WBC is 7.9.  I did discuss the case with the reading radiologist, who noted pulmonary emboli in the left lung.        Differential includes but not limited to:  PE: Noted and treated with heparin   Acute coronary syndrome: Not clinically present  CHF: Not clinically present  Costochondritis: Not clinically present      Follow-Up Discussion:  The patient is updated    Please see documentation above for specific labs and radiology.    Decision for and Complexity of Risk in the ED encounter:  I ordered prescription medications requiring authorization in the ED or at discharge.  I decided when to consult for hospitalization or escalation of hospital-level care.  I ordered Parenteral Controlled Substances   Discussion of care with a provider outside of the Emergency Department: Dr. Hilton Lucky, hospitalist for admission evaluation.                   ED Course as of 07/16/23 1925   Wed Jul 16, 2023   1557 Case discussed with Radiology.  The patient does have pulmonary emboli.   1811 Dr. Hilton Lucky contacted for admission evaluation.    1922 CT ANGIO CHEST W IV CONTRAST  On my independent review of the imaging studies which will also be reviewed independently by a radiologist, the study shows:  Filling defect noted in the pulmonary vasculature.                 Medications Ordered/Administered in the ED   iohexol (OMNIPAQUE 350) infusion (75 mL Intravenous Given 07/16/23 1545)       Patient will be admitted to the  service for further workup and management.    Disposition: Admitted             Risk as noted in the medical decision-making is in reference to potential morbidity/mortality of management based upon previously established billing guidelines.    Based upon the clinical setting, the likely  diagnosis/impression include:    Clinical Impression   Pulmonary embolism, unspecified chronicity, unspecified pulmonary embolism type, unspecified whether acute cor pulmonale present (CMS HCC) (Primary)   Chest pain, unspecified type   History of nephrectomy   History of cancer         Current Discharge Medication List                This note was partially created using voice recognition software and is inherently subject to errors including those of syntax and sound alike  substitutions which may escape proof reading. In such instances, original meaning may be extrapolated by contextual derivation.      /R. Ascencion Lava, MD, Rexann Catalan  Department of Emergency Medicine  [facility]

## 2023-07-16 NOTE — Care Plan (Signed)
**Note De-Identified Elizabeth [name] Obfuscation** Admitted this shift for a pulmonary embolism. Alert and orientated to the room and floor. Heparin  drip infusing as ordered. Lab monitoring to continues. Telemetry in use. Hematology consulted and following. Fall risk policy in place for patient safety. Bed alarm engaged. Call bell in reach. Transition readiness ongoing.

## 2023-07-16 NOTE — ED Triage Notes (Signed)
 WOKE UP WITH MIDSTERNAL CHEST PAIN. LEFT KIDNEY REMOVED 1 MONTH AGO. WORSE WITH INSPIRATION. GIVEN ASA AND NTG WITH NO RELIEF.

## 2023-07-16 NOTE — H&P (Signed)
 [facility]  Admission H&P      Date of Service:  07/16/2023  [facility] y.o. 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  She reports that she did not have any shortness of breath Bard the chest pain got worse which prompted her to come to the hospital.  She denied any cough or loss of consciousness.  She denied any fever or chills or rigors.    History:    Past Medical:    Past Medical History:   Diagnosis Date    Anemia     Cancer (CMS HCC)     brain tumor 8/15 with chemo and radiation    CPAP (continuous positive airway pressure) dependence     CVA (cerebrovascular accident)     mini during brain surgery per pt    Diabetes mellitus, type 2     Disorder of thyroid     Epileptic seizure (CMS [facility])     Essential hypertension     Hx of breast cancer     Pulmonary embolism 07/16/2023    Sleep apnea      Past Surgical:    Past Surgical History:   Procedure Laterality Date    BRAIN SURGERY      HX CHOLECYSTECTOMY       Family:    Family Medical History:       Problem Relation (Age of Onset)    Arthritis-osteo Mother    Breast Cancer Paternal Grandmother    Heart Disease Father    Hypertension (High Blood Pressure) Mother    No Known Problems Sister, Brother, Maternal Grandmother, Maternal Grandfather, Paternal Grandfather, Daughter, Son, Maternal Aunt, Maternal Uncle, Paternal  Aunt, Paternal Uncle, Other          Social:   reports that she has never smoked. She has never used smokeless tobacco. She reports that she does not drink alcohol and does not use drugs.    Allergies[1]  Medications Prior to Admission       Prescriptions    atorvastatin  (LIPITOR) 20 mg Oral Tablet    Take 1 Tablet (20 mg total) by mouth Every night    chromic chloride (CHROMIUM CHLORIDE ORAL)    Take 1 Tablet by mouth Daily    CINNAMON BARK EXTRACT ORAL    Take 2 Capsules by mouth Daily    ergocalciferol, vitamin D2, (DRISDOL) 1,250 mcg (50,000 unit) Oral Capsule    Take 1 Capsule (50,000 Units total) by mouth Every Sunday    escitalopram  oxalate (LEXAPRO ) 20 mg Oral Tablet    Take 1 Tablet (20 mg total) by mouth Daily    famotidine  (PEPCID ) 20 mg Oral Tablet    Take 1 Tablet (20 mg total) by mouth Daily    FREESTYLE LANCETS 28 gauge Misc    TEST ONCE DAILY    FREESTYLE LITE METER  Kit    EVERY DAY    FREESTYLE LITE STRIPS Strip    TEST EVERY DAY    hydroCHLOROthiazide  (HYDRODIURIL ) 25 mg Oral Tablet    Take 1 Tablet (25 mg total) by mouth Every morning    levothyroxine  (SYNTHROID ) 25 mcg Oral Tablet    TAKE 1 TABLET ONCE A DAY IN THE MORNING 30 MINUTES BEFORE MEALS OR ANY OTHER MEDICATIONS    losartan  (COZAAR ) 100 mg Oral Tablet    Take 1 Tablet (100 mg total) by mouth Daily    magnesium  Oxide 420 mg Oral Tablet    Take 1 Tablet (420 mg total) by mouth Daily    methocarbamoL  (ROBAXIN ) 500 mg Oral Tablet    Take 1 Tablet (500 mg total) by mouth Three times a day as needed    phenytoin  sodium extended release (DILANTIN ) 100 mg Oral Capsule    TAKE 3 CAPSULES BY MOUTH TWICE A DAY    semaglutide (OZEMPIC) 0.25 mg or 0.5 mg(2 mg/1.5 mL) Subcutaneous Pen Injector    Inject 0.5 mg under the skin Every 7 days          acetaminophen  (TYLENOL ) tablet, 650 mg, Oral, Q4H PRN  atorvastatin  (LIPITOR) tablet, 20 mg, Oral, NIGHTLY  Correction/SSIP insulin lispro 100 units/mL injection, 3-14 Units, Subcutaneous, 4x/day AC  dextrose   (GLUTOSE) 40% oral gel, 15 g, Oral, Q15 Min PRN  dextrose  50% (0.5 g/mL) injection - syringe, 12.5 g, Intravenous, Q15 Min PRN  escitalopram  (LEXAPRO ) tablet, 20 mg, Oral, Daily  famotidine  (PEPCID ) tablet, 20 mg, Oral, Daily  glucagon injection 1 mg, 1 mg, IntraMUSCULAR, Once PRN  heparin  25,000 units in D5W 250 mL infusion, 18 Units/kg/hr (Adjusted), Intravenous, Continuous  [Held by provider] hydroCHLOROthiazide  (HYDRODIURIL ) tablet, 25 mg, Oral, QAM  [START ON 07/17/2023] levothyroxine  (SYNTHROID ) tablet, 25 mcg, Oral, QAM  [Held by provider] losartan  (COZAAR ) tablet, 100 mg, Oral, Daily  magnesium  oxide (MAG-OX) 400mg  (241.3 mg elemental magnesium ) tablet, 400 mg, Oral, Daily  methocarbamol  (ROBAXIN ) tablet, 500 mg, Oral, 3x/day PRN  morphine 2 mg/mL injection, 1 mg, Intravenous, Q4H PRN  NS premix infusion, , Intravenous, Continuous  ondansetron  (ZOFRAN ) 2 mg/mL injection, 4 mg, Intravenous, Q6H PRN  phenytoin  sodium extended release (DILANTIN ) capsule, 300 mg, Oral, Daily  polyethylene glycol (MIRALAX ) oral packet, 17 g, Oral, Daily  [Held by provider] semaglutide (OZEMPIC) 0.25 mg or 0.5 mg subcutaneous pen, 0.5 mg, Subcutaneous, Q7 Days        ROS:   10+ systems were reviewed and were negative except for above    All other systems negative unless marked.       Exam:  Vitals:    07/16/23 1745 07/16/23 1800 07/16/23 1815 07/16/23 1830   BP: 138/79 (!) 142/77 136/72 132/81   Pulse: 82 75 76 83   Resp: 17 (!) 23 19 (!) 22   Temp:       SpO2: 97% 97% 95% 96%   Weight:       Height:       BMI:                 Physical Exam  Vitals and nursing note reviewed.   Constitutional:       General: She is not in acute distress.     Appearance: She is well-developed.   HENT:      Head: Normocephalic and atraumatic.   Eyes:      Conjunctiva/sclera: Conjunctivae normal.   Cardiovascular:      Rate and  Rhythm: Normal rate and regular rhythm.      Heart sounds: No murmur heard.  Pulmonary:      Effort: Pulmonary effort is  normal. No respiratory distress.      Breath sounds: Normal breath sounds.   Abdominal:      Palpations: Abdomen is soft.      Tenderness: There is no abdominal tenderness.   Musculoskeletal:         General: No swelling.      Cervical back: Neck supple.   Skin:     General: Skin is warm and dry.      Capillary Refill: Capillary refill takes less than 2 seconds.   Neurological:      Mental Status: She is alert.   Psychiatric:         Mood and Affect: Mood normal.           Labs:     Results for orders placed or performed during the hospital encounter of 07/16/23 (from the past 24 hours)   CBC/DIFF    Collection Time: 07/16/23  1:54 PM    Narrative    The following orders were created for panel order CBC/DIFF.  Procedure                               Abnormality         Status                     ---------                               -----------         ------                     CBC WITH DIFF[[id number]]                Abnormal            Final result                 Please view results for these tests on the individual orders.   COMPREHENSIVE METABOLIC PANEL, NON-FASTING    Collection Time: 07/16/23  1:54 PM   Result Value Ref Range    SODIUM 140 136 - 145 mmol/L    POTASSIUM 3.6 3.5 - 5.1 mmol/L    CHLORIDE 104 98 - 107 mmol/L    CO2 TOTAL 31 21 - 31 mmol/L    ANION GAP 5 4 - 13 mmol/L    BUN 11 7 - 25 mg/dL    CREATININE 8.65 7.84 - 1.30 mg/dL    BUN/CREA RATIO 12 6 - 22    ESTIMATED GFR 74 >59 mL/min/1.88m^2    ALBUMIN  4.3 3.5 - 5.7 g/dL    CALCIUM  9.2 8.6 - 10.3 mg/dL    GLUCOSE 696 (H) 74 - 109 mg/dL    ALKALINE PHOSPHATASE 121 (H) 34 - 104 U/L    ALT (SGPT) 20 7 - 52 U/L    AST (SGOT) 15 13 - 39 U/L    BILIRUBIN TOTAL 0.3 0.3 - 1.0 mg/dL    PROTEIN TOTAL 6.5 6.4 - 8.9 g/dL    ALBUMIN /GLOBULIN RATIO 2.0 (H) 0.8 - 1.4    OSMOLALITY, CALCULATED 282 270 - 290 mOsm/kg    CALCIUM , CORRECTED 9.0 8.9 - 10.8 mg/dL  GLOBULIN 2.2 2.0 - 3.5    Narrative    Estimated Glomerular Filtration Rate (eGFR) is calculated  using the CKD-EPI (2021) equation, intended for patients 24 years of age and older. If gender is not documented or unknown, there will be no eGFR calculation.     TROPONIN NOW    Collection Time: 07/16/23  1:54 PM   Result Value Ref Range    TROPONIN I 3 <15 ng/L   CBC WITH DIFF    Collection Time: 07/16/23  1:54 PM   Result Value Ref Range    WBC 7.9 3.8 - 11.8 x10^3/uL    RBC 3.89 3.63 - 4.92 x10^6/uL    HGB 12.7 10.9 - 14.3 g/dL    HCT 16.1 09.6 - 04.5 %    MCV 93.4 75.5 - 95.3 fL    MCH 32.6 24.7 - 32.8 pg    MCHC 34.8 32.3 - 35.6 g/dL    RDW 40.9 81.1 - 91.4 %    PLATELETS 233 140 - 440 x10^3/uL    MPV 7.4 (L) 7.9 - 10.8 fL    NEUTROPHIL % 61 43 - 77 %    LYMPHOCYTE % 26 16 - 46 %    MONOCYTE % 7 4 - 11 %    EOSINOPHIL % 5 1 - 7 %    BASOPHIL % 1 0 - 1 %    NEUTROPHIL # 4.90 1.90 - 8.20 x10^3/uL    LYMPHOCYTE # 2.10 1.10 - 3.10 x10^3/uL    MONOCYTE # 0.60 0.20 - 0.90 x10^3/uL    EOSINOPHIL # 0.40 0.00 - 0.50 x10^3/uL    BASOPHIL # 0.00 0.00 - 0.10 x10^3/uL   B-TYPE NATRIURETIC PEPTIDE (BNP),PLASMA    Collection Time: 07/16/23  1:54 PM   Result Value Ref Range    BNP 10 1 - 100 pg/mL    Narrative                                Class 1: 101-250 pg/mL                              Class 2: 251-550 pg/mL                              Class 3: 551-900 pg/mL                              Class 4: >901 pg/mL     The [place]  [employer] has developed a four-stage functional classification system for CHF that is based on a subjective interpretation of the severity of a patient's clinical signs and symptoms.    Class 1 - Patients have no limitations on physical activity and have no symptoms with ordinary physical activity.    Class 2 - Patients have a slight limitation of physical activity and have symptoms with ordinary physical activity.    Class 3 - Patients have a marked limitation of physical activity and have symptoms with less than ordinary physical activity, but not at rest.    Class 4 - Patients are unable  to perform any physical activity without discomfort.   PTT (PARTIAL THROMBOPLASTIN TIME)    Collection Time: 07/16/23  2:18 PM   Result Value Ref Range  APTT 27.3 25.0 - 38.0 seconds   EXTRA TUBES    Collection Time: 07/16/23  2:18 PM    Narrative    The following orders were created for panel order EXTRA TUBES.  Procedure                               Abnormality         Status                     ---------                               -----------         ------                     BLUE TOP ZOXW[[id number]]                                    Final result               GOLD TOP [id number]]                                    In process                 GRAY TOP HYQM[[id number]]                                    Final result                 Please view results for these tests on the individual orders.   BLUE TOP TUBE    Collection Time: 07/16/23  2:18 PM   Result Value Ref Range    RAINBOW/EXTRA TUBE AUTO RESULT Yes    GRAY TOP TUBE    Collection Time: 07/16/23  2:18 PM   Result Value Ref Range    RAINBOW/EXTRA TUBE AUTO RESULT Yes    TROPONIN IN ONE HOUR    Collection Time: 07/16/23  3:25 PM   Result Value Ref Range    TROPONIN I 4 <15 ng/L   TROPONIN IN THREE HOURS    Collection Time: 07/16/23  5:03 PM   Result Value Ref Range    TROPONIN I 4 <15 ng/L   CBC    Collection Time: 07/16/23  5:03 PM   Result Value Ref Range    WBC 9.4 3.8 - 11.8 x10^3/uL    RBC 3.86 3.63 - 4.92 x10^6/uL    HGB 12.7 10.9 - 14.3 g/dL    HCT 52.8 41.3 - 24.4 %    MCV 92.4 75.5 - 95.3 fL    MCH 32.8 24.7 - 32.8 pg    MCHC 35.5 32.3 - 35.6 g/dL    RDW 01.0 27.2 - 53.6 %    PLATELETS 228 140 - 440 x10^3/uL    MPV 7.2 (L) 7.9 - 10.8 fL        Imaging Studies:    CT ANGIO CHEST W IV CONTRAST   Final Result   Acute pulmonary emboli in segmental and subsegmental left lower lobe branches. No evidence for right heart  strain. No thoracic aortic dissection. Critical findings discussed with Dr. Lawrance Presume by myself at the time of this dictation.       Noncalcified pulmonary nodules again noted bilaterally stable from recent PET/CT measuring up to 1 cm in the right middle lobe. New patchy atelectasis the left lung base with elevation of left hemidiaphragm now noted.               Radiologist location ID: [id number]             DNR Status:  FULL CODE: ATTEMPT RESUSCITATION/CPR    Assessment/Plan:   Active Hospital Problems    Diagnosis    Primary Problem: Pulmonary embolism   #Acute pulmonary embolism  Presented with chest pain, no shortness a breath  CT chest showed acute PE in segmental and subsegmental left lower lobe branches, no signs of heart strain  CBC and CMP unremarkable  PESI score is class 2, 84 points, low risk, 1.7-3.5% 30 day mortality  Echo, bilateral lower extremity Dopplers ordered  Heme-Onc team on board  Continue IV heparin  for now    #Renal tumor status post nephrectomy ?  Malignant  Heme-Onc team on board    #brain tumor, status post surgical resection, complicated by CVA, 2015, with residual minimal aphasia  Currently on statin, continue    #Hypothyroidism  On levothyroxine  supplementation, continue    #Epileptic seizure  On phenytoin , continue    #Hypertension  Holding off on losartan  and hydrochlorothiazide   On IV fluids for now    #Diabetes mellitus  On sliding scale, blood glucose appropriately controlled      DVT/PE Prophylaxis:  On IV heparin       Rhetta Cellar, MD    This note was partially generated using MModal Fluency Direct system, and there may be some incorrect words, spellings, and punctuation that were not noted in checking the note before saving.       [1]   Allergies  Allergen Reactions    Ace Inhibitors      Other reaction(s): Angioedema

## 2023-07-16 NOTE — ED Nurses Note (Signed)
 Report called to 4E.

## 2023-07-16 NOTE — ED Nurses Note (Signed)
 Pt woke up this am c/o left side mid sternal CP that is continuous sharp and is worse with inspiration

## 2023-07-17 ENCOUNTER — Encounter (HOSPITAL_COMMUNITY): Payer: Self-pay | Admitting: HOSPITALIST

## 2023-07-17 ENCOUNTER — Inpatient Hospital Stay (HOSPITAL_COMMUNITY)

## 2023-07-17 DIAGNOSIS — C649 Malignant neoplasm of unspecified kidney, except renal pelvis: Secondary | ICD-10-CM

## 2023-07-17 DIAGNOSIS — R079 Chest pain, unspecified: Secondary | ICD-10-CM

## 2023-07-17 DIAGNOSIS — R0600 Dyspnea, unspecified: Secondary | ICD-10-CM

## 2023-07-17 DIAGNOSIS — I445 Left posterior fascicular block: Secondary | ICD-10-CM

## 2023-07-17 DIAGNOSIS — I10 Essential (primary) hypertension: Secondary | ICD-10-CM

## 2023-07-17 DIAGNOSIS — Z905 Acquired absence of kidney: Secondary | ICD-10-CM

## 2023-07-17 DIAGNOSIS — R4701 Aphasia: Secondary | ICD-10-CM

## 2023-07-17 DIAGNOSIS — R911 Solitary pulmonary nodule: Secondary | ICD-10-CM

## 2023-07-17 DIAGNOSIS — E119 Type 2 diabetes mellitus without complications: Secondary | ICD-10-CM

## 2023-07-17 DIAGNOSIS — G40909 Epilepsy, unspecified, not intractable, without status epilepticus: Secondary | ICD-10-CM

## 2023-07-17 DIAGNOSIS — Z8673 Personal history of transient ischemic attack (TIA), and cerebral infarction without residual deficits: Secondary | ICD-10-CM

## 2023-07-17 DIAGNOSIS — Z85841 Personal history of malignant neoplasm of brain: Secondary | ICD-10-CM

## 2023-07-17 DIAGNOSIS — E039 Hypothyroidism, unspecified: Secondary | ICD-10-CM

## 2023-07-17 DIAGNOSIS — R918 Other nonspecific abnormal finding of lung field: Secondary | ICD-10-CM

## 2023-07-17 DIAGNOSIS — C642 Malignant neoplasm of left kidney, except renal pelvis: Secondary | ICD-10-CM

## 2023-07-17 HISTORY — DX: Chest pain, unspecified: R07.9

## 2023-07-17 HISTORY — DX: Dyspnea, unspecified: R06.00

## 2023-07-17 LAB — COMPREHENSIVE METABOLIC PANEL, NON-FASTING
ALBUMIN/GLOBULIN RATIO: 2 — ABNORMAL HIGH (ref 0.8–1.4)
ALBUMIN: 4.1 g/dL (ref 3.5–5.7)
ALKALINE PHOSPHATASE: 110 U/L — ABNORMAL HIGH (ref 34–104)
ALT (SGPT): 22 U/L (ref 7–52)
ANION GAP: 8 mmol/L (ref 4–13)
AST (SGOT): 17 U/L (ref 13–39)
BILIRUBIN TOTAL: 0.3 mg/dL (ref 0.3–1.0)
BUN/CREA RATIO: 13 (ref 6–22)
BUN: 9 mg/dL (ref 7–25)
CALCIUM, CORRECTED: 8.7 mg/dL — ABNORMAL LOW (ref 8.9–10.8)
CALCIUM: 8.8 mg/dL (ref 8.6–10.3)
CHLORIDE: 103 mmol/L (ref 98–107)
CO2 TOTAL: 28 mmol/L (ref 21–31)
CREATININE: 0.72 mg/dL (ref 0.60–1.30)
ESTIMATED GFR: 99 mL/min/{1.73_m2} (ref >59)
GLOBULIN: 2.1 (ref 2.0–3.5)
GLUCOSE: 133 mg/dL — ABNORMAL HIGH (ref 74–109)
OSMOLALITY, CALCULATED: 278 mosm/kg (ref 270–290)
POTASSIUM: 3.8 mmol/L (ref 3.5–5.1)
PROTEIN TOTAL: 6.2 g/dL — ABNORMAL LOW (ref 6.4–8.9)
SODIUM: 139 mmol/L (ref 136–145)

## 2023-07-17 LAB — ECG 12 LEAD
Atrial Rate: 89 {beats}/min
Calculated P Axis: 58 degrees
Calculated R Axis: 127 degrees
Calculated T Axis: 54 degrees
PR Interval: 156 ms
QRS Duration: 76 ms
QT Interval: 370 ms
QTC Calculation: 450 ms
Ventricular rate: 89 {beats}/min

## 2023-07-17 LAB — CBC WITH DIFF
BASOPHIL #: 0 10*3/uL (ref 0.00–0.10)
BASOPHIL %: 1 % (ref 0–1)
EOSINOPHIL #: 0.3 10*3/uL (ref 0.00–0.50)
EOSINOPHIL %: 4 % (ref 1–7)
HCT: 36.1 % (ref 31.2–41.9)
HGB: 12.5 g/dL (ref 10.9–14.3)
LYMPHOCYTE #: 2.4 10*3/uL (ref 1.10–3.10)
LYMPHOCYTE %: 33 % (ref 16–46)
MCH: 32.7 pg (ref 24.7–32.8)
MCHC: 34.7 g/dL (ref 32.3–35.6)
MCV: 94 fL (ref 75.5–95.3)
MONOCYTE #: 0.5 10*3/uL (ref 0.20–0.90)
MONOCYTE %: 7 % (ref 4–11)
MPV: 7.3 fL — ABNORMAL LOW (ref 7.9–10.8)
NEUTROPHIL #: 4.1 10*3/uL (ref 1.90–8.20)
NEUTROPHIL %: 57 % (ref 43–77)
PLATELETS: 201 10*3/uL (ref 140–440)
RBC: 3.83 10*6/uL (ref 3.63–4.92)
RDW: 14.1 % (ref 12.3–17.7)
WBC: 7.3 10*3/uL (ref 3.8–11.8)

## 2023-07-17 LAB — POC BLOOD GLUCOSE (RESULTS)
GLUCOSE, POC: 116 mg/dL — ABNORMAL HIGH (ref 70–100)
GLUCOSE, POC: 120 mg/dL — ABNORMAL HIGH (ref 70–100)
GLUCOSE, POC: 135 mg/dL — ABNORMAL HIGH (ref 70–100)
GLUCOSE, POC: 135 mg/dL — ABNORMAL HIGH (ref 70–100)

## 2023-07-17 LAB — PTT (PARTIAL THROMBOPLASTIN TIME)
APTT: 58.7 s — ABNORMAL HIGH (ref 25.0–38.0)
APTT: 54 s — ABNORMAL HIGH (ref 25.0–38.0)
APTT: 66.1 s — ABNORMAL HIGH (ref 25.0–38.0)

## 2023-07-17 LAB — TRANSTHORACIC ECHOCARDIOGRAM - ADULT
EF VISUAL ESTIMATE: 65
EF: 70

## 2023-07-17 LAB — MAGNESIUM: MAGNESIUM: 2 mg/dL (ref 1.9–2.7)

## 2023-07-17 MED ORDER — HYDROMORPHONE 2 MG/ML INJECTION WRAPPER
0.5000 mg | INJECTION | INTRAMUSCULAR | Status: DC | PRN
Start: 2023-07-17 — End: 2023-07-19
  Administered 2023-07-17 – 2023-07-19 (×6): 0.5 mg via INTRAVENOUS
  Filled 2023-07-17 (×6): qty 1

## 2023-07-17 MED ORDER — ALUMINUM-MAG HYDROXIDE-SIMETHICONE 200 MG-200 MG-20 MG/5 ML ORAL SUSP
15.0000 mL | Freq: Four times a day (QID) | ORAL | Status: DC | PRN
Start: 2023-07-17 — End: 2023-07-19
  Administered 2023-07-17: 15 mL via ORAL
  Filled 2023-07-17: qty 30

## 2023-07-17 MED ORDER — SODIUM CHLORIDE 0.9 % INJECTION SOLUTION
2.0000 mL | Freq: Once | INTRAVENOUS | Status: DC | PRN
Start: 2023-07-17 — End: 2023-07-19
  Administered 2023-07-17: 5 mL via INTRAVENOUS

## 2023-07-17 NOTE — Cancer Center Note (Incomplete)
 UROLOGIC ONCOLOGY SURGERY NOTE    CHIEF COMPLAINT:   Post-operative visit     SUBJECTIVE:  53 y.o. female presents today for a postoperative visit. The patient tolerated the procedure well and continues to recover without any issues. The patient is tolerating a regular diet, having regular bowel movements,and ambulating without issues. The patient denies any fevers, chills, chest pain, shortness of breath, nausea, vomiting, diarrhea, or any other associated symptoms at this time.     OBJECTIVE:  There were no vitals taken for this visit.      General: Patient is vitally stable (see values). Appears calm and at rest. Dressed appropriately.    Skin: Warm and dry.     Eyes: Eyes are clear.    Pulmonary: Respiratory effort is unlabored.     Psychiatric: Patient is alert, appropriate mood, and in no acute distress.     Musculoskeletal: Patient ambulates without assistance   Cardiovascular: Palpable peripheral pulses.    Neurologic: Neurological exam is consistent with patient's age.      Gastrointestinal: {ah gi exam:39753::The abdomen is soft, nontender and non-distended. }   Genitourinary: {ah GU exam:39751::No costovertebral angle tenderness bilaterally.,No suprapubic fullness or tenderness.}     ASSESSMENT:  53 y.o. female with:    1. 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 Reviewed pathology report with the patient and provided the patient with a printed copy  2. Refer to medical oncology.   3. Follow-up in ***    ATTESTATION:  This was a shared visit with Dr. Lavonna Prader.    [employer], FNP-C, 07/17/2023 13:58

## 2023-07-17 NOTE — PT Evaluation (Signed)
 [facility]  [address]  [place], [address]  [phone]  (Fax) [phone]  Rehabilitation Services  Physical Therapy Inpatient Initial Evaluation    Patient Name: Elizabeth [name]  Date of Birth: March 10, 1970  Height: Height: 157.5 cm (5' 2.01)  Weight: Weight: 89.4 kg (197 lb)  Room/Bed: 428/A  Payor: BLUE CROSS BLUE [employer]IELD / Plan: [place] [employer] PPO / Product Type: PPO /       PMH:  Past Medical History:   Diagnosis Date    Anemia     Cancer (CMS HCC)     brain tumor 8/15 with chemo and radiation    Chest pain, unspecified type 07/17/2023    CPAP (continuous positive airway pressure) dependence     CVA (cerebrovascular accident)     mini during brain surgery per pt    Diabetes mellitus, type 2     Disorder of thyroid     Dyspnea, unspecified type 07/17/2023    Epileptic seizure (CMS HCC)     Essential hypertension     Pulmonary embolism 07/16/2023    Sleep apnea            Assessment:      (P) Patient is a 53 year old female admitted 6/11 due to a PE. Patient was agreeable to PT/OT co-evaluation upon arrival this date. Patient completed supine <> sitting EOB with modified independence and completed STS using FWW with modified independence. Patient ambulated 220' using FWW requiring CGA + verbal cues. Patient required VC's to increase step length and stride width throughout ambulation. Patient is a good candidate for skilled PT intervention during hospital admission to improve strength, ROM, balance, endurance, and independence with functional mobility.    Total Distance Ambulated: (P) 220  Independence: (P) contact guard assist, verbal cues required  Assistive Device: (P) walker, front wheeled      Discharge Needs:    Equipment Recommendation: (P) front wheeled walker      The patient presents with mobility limitations due to impaired balance, impaired range of motion, impaired strength, and impaired functional activity tolerance that significantly impair/prevent  patient's ability to participate in mobility-related activities of daily living (MRADLs) including  ambulation and transfers in order to safely complete, toileting, bathing, food preparation, laundering/household tasks, safely entering/exiting the home, in reasonable time. This functional mobility deficit can be sufficiently resolved with the use of a (P) front wheeled walker  in order to decrease the risk of falls, morbidity, and mortality in performance of these MRADLs.  Patient is able to safely use this assistive device.    Discharge Disposition: (P) home with assist, home with home health    JUSTIFICATION OF DISCHARGE RECOMMENDATION   Based on current diagnosis, functional performance prior to admission, and current functional performance, this patient requires continued PT services in (P) home with assist, home with home health in order to achieve significant functional improvements in these deficit areas: (P) aerobic capacity/endurance, gait, locomotion, and balance, ROM (range of motion), posture, muscle performance.        Plan:   Current Intervention: (P) balance training, bed mobility training, gait training, home exercise program, stair training, strengthening, stretching, transfer training, ROM (range of motion), postural re-education, patient/family education  To provide physical therapy services (P) other (see comments) (1-2x/day at least one day weekly.)  for duration of (P) until discharge.    The risks/benefits of therapy have been discussed with the patient/caregiver and he/she is in agreement with the established plan  of care.       Subjective & Objective     Past Medical History:   Diagnosis Date    Anemia     Cancer (CMS HCC)     brain tumor 8/15 with chemo and radiation    Chest pain, unspecified type 07/17/2023    CPAP (continuous positive airway pressure) dependence     CVA (cerebrovascular accident)     mini during brain surgery per pt    Diabetes mellitus, type 2     Disorder of thyroid      Dyspnea, unspecified type 07/17/2023    Epileptic seizure (CMS [facility])     Essential hypertension     Pulmonary embolism 07/16/2023    Sleep apnea             Past Surgical History:   Procedure Laterality Date    BRAIN SURGERY      HX CHOLECYSTECTOMY        07/17/23 0912   Rehab Session   Document Type evaluation   PT Visit Date 07/17/23   General Information   Patient Profile Reviewed yes   Pertinent History of Current Functional Problem Patient isa 53 year old female admitted 6/11 due to a PE. Patient has a PMH of anemia, cancer, CPAP dependence, CVA, DM type 2, thyroid disorder, epileptic seizure, essential HTN, and PE. PT was consulted for evaluation.   Medical Lines Telemetry;PIV Line   Respiratory Status room air   Existing Precautions/Restrictions no known precautions/limitations   Mutuality/Individual Preferences   Anxieties, Fears or Concerns headache   Individualized Care Needs assistance with ADLs, monitoring of vitals   Patient-Specific Goals (Include Timeframe) d/c when able   Plan of Care Reviewed With patient   Living Environment   Lives With child(ren), adult   Living Arrangements apartment   Home Assessment: No Problems Identified   Home Accessibility stairs within home;tub/shower is not walk in;stairs to enter home   Stairs Within Home, Primary   Number of Stairs, Within Home, Primary other (see comments)   Stair Railings, Within Home, Primary other (see comments)  (has 1 railing unable to report which side)   Home Main Entrance   Number of Stairs, Main Entrance other (see comments)  (patient unable to report # of stairs to enter home, per chart review on 06/20/23 10 stairs are documented)   Functional Level Prior   Ambulation 1 - assistive equipment   Transferring 0 - independent   Toileting 0 - independent   Bathing 1 - assistive equipment   Dressing 0 - independent   Eating 0 - independent   Communication 0 - understands/communicates without difficulty   Swallowing 0-->swallows foods/liquids without  difficulty   Prior Functional Level Comment Patient reports using SPC for ambulation and shower chair for bathing prior to admission. Patient was independent with ADLs.   Pre Treatment Status   Pre Treatment Patient Status Patient supine in bed;Call light within reach;Patient safety alarm activated   Support Present Pre Treatment  None   Communication Pre Treatment  Charge Nurse   Communication Pre Treatment Comment CN cleared pt for PT eval.   Cognitive Assessment/Interventions   Behavior/Mood Observations behavior appropriate to situation, WNL/WFL   Orientation Status oriented x 4   Attention WNL/WFL   Follows Commands [facility]   Pre- Treatment Vital Signs   Pre-Treatment Heart Rate (beats/min) 86   Pre SpO2 (%) 93   O2 Delivery Pre Treatment room air   Pre-Treatment Pain   Pretreatment Pain  Rating 2/10   Pre/Posttreatment Pain Comment L side chest pain   RLE Assessment   RLE Assessment WFL- Within Functional Limits   LLE Assessment   LLE Assessment WFL- Within Functional Limits   Trunk Assessment   Trunk Assessment WFL-Within Functional Limits   Mobility Assessment/Training   Additional Documentation Bed Mobility Assessment/Treatment (Group);Transfer Assessment/Treatment (Group);Gait Assessment/Treatment (Group)   Bed Mobility   Supine-Sit Independence modified independence   Sit to Supine, Independence modified independence   Bed Mobility, Assistive Device bed rails;Head of Bed Elevated   Transfer Assessment/Treatment   Sit-Stand Independence modified independence   Stand-Sit Independence modified independence   Sit-Stand-Sit, Assist Device walker, front wheeled   Gait Assessment/Treatment   Total Distance Ambulated 220   Independence  contact guard assist;verbal cues required   Assistive Device  walker, front wheeled   Distance in Feet 220   Gait Speed decreased   Deviations  stride length decreased;step length decreased;narrow BOS   Impairments  endurance   Comment patient required max verbal cues to increase  stride length and width.   Post Treatment Status   Post Treatment Patient Status Patient supine in bed;Call light within reach;Patient safety alarm activated   Support Present Post Treatment  None   Communication Post Treatement Nurse   Patient Effort excellent   Post-Treatment Vital Signs   Post-treatment Heart Rate (beats/min) 90   Post SpO2 (%) 98   O2 Delivery Post Treatment room air   Post-Treatment Pain   Posttreatment Pain Rating 2/10   Physical Therapy Clinical Impression   Assessment Patient is a 53 year old female admitted 6/11 due to a PE. Patient was agreeable to PT/OT co-evaluation upon arrival this date. Patient completed supine <> sitting EOB with modified independence and completed STS using FWW with modified independence. Patient ambulated 220' using FWW requiring CGA + verbal cues. Patient required VC's to increase step length and stride width throughout ambulation. Patient is a good candidate for skilled PT intervention during hospital admission to improve strength, ROM, balance, endurance, and independence with functional mobility.   Criteria for Skilled Therapeutic yes   Impairments Found (describe specific impairments) aerobic capacity/endurance;gait, locomotion, and balance;ROM (range of motion);posture;muscle performance   Functional Limitations in Following  self-care;home management;community/leisure   Rehab Potential good   Therapy Frequency other (see comments)  (1-2x/day at least one day weekly.)   Predicted Duration of Therapy Intervention (days/wks) until discharge   Anticipated Equipment Needs at Discharge (PT) front wheeled walker   Anticipated Discharge Disposition home with assist;home with home health   Evaluation Complexity Justification   Patient History: Co-morbidity/factors that impact Plan of Care 3 or more that impact Plan of Care   Examination Components 3 or more Exam elements addressed   Presentation Stable: Uncomplicated, straight-forward, problem focused   Clinical  Decision Making Moderate complexity   Evaluation Complexity Moderate complexity   Care Plan Goals   PT Rehab Goals Gait Training Goal;Stairs Training Goal   Planned Therapy Interventions, PT Eval   Planned Therapy Interventions (PT) balance training;bed mobility training;gait training;home exercise program;stair training;strengthening;stretching;transfer training;ROM (range of motion);postural re-education;patient/family education   Stairs Training Goal   Stairs Training Goal, Date Established 07/17/23   Stairs Training Goal, Time to Achieve by discharge   Stairs Training Goal, Independence Level modified independence   Stairs Training Goal, Current Status other (see comments)  (not assessed)   Stairs Training Goal, Assist Device least restrictive assistive device   Stairs Training Goal, Number of Stairs to Achieve 12  Gait Training  Goal, Distance to Achieve   Gait Training  Goal, Date Established 07/17/23   Gait Training  Goal, Time to Achieve by discharge   Gait Training  Goal, Current Status contact guard assist   Gait Training  Goal, Independence Level modified independence   Gait Training  Goal, Assist Device least restricted assistive device   Gait Training  Goal, Distance to Achieve 300   Physical Therapy Time and Intention   Total PT Minutes: 10           INTERVENTION MINUTES: EVALUATION 10 minutes    EVALUATION COMPLEXITY : CLINICAL DECISION MAKING OF MODERATE COMPLEXITY AS INDICATED BY PMHX, PHYSICAL THERAPY ASSESSMENT OF MUSCULOSKELETAL AND NEUROLOGICAL SYSTEMS AND ACTIVITY LIMITATIONS. CLINICAL PRESENTATION HAS CHANGING CHARACTERISTICS.    Therapist:     Leamon Pringle, PT  07/17/2023, 13:10

## 2023-07-17 NOTE — Progress Notes (Signed)
 Elizabeth [name] MEDICINE Elizabeth [name]    HOSPITALIST PROGRESS NOTE    Elizabeth [name]  Date of service: 07/17/2023  Date of Admission:  07/16/2023  Hospital Day:  LOS: 1 day     Subjective: Pt seen and examined. 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 She is doing well. No CP, SOB or swelling in her legs. She is on room air and satting 94%. She is on heparin  drip.     Vital Signs:  Filed Vitals:    07/17/23 0728 07/17/23 0755 07/17/23 1126 07/17/23 1300   BP: (!) 148/83   (!) 179/83   Pulse: 77 80 79 78   Resp: 18   18   Temp: 36.1 C (97 F)   37.8 C (100 F)   SpO2: 95%   96%        Physical Exam:  General:  Patient in NAD, resting in bed, no visitors present, obese  Head:  Normocephalic, atraumatic  Eyes:  PERRL, anicteric sclera  ENT:  Oral mucosa moist, no nasal discharge   Neck:  Soft, supple, trachea midline  Heart:  RRR, S1 and S2 normal  Lungs:  Unlabored respirations.  Lungs are clear to auscultation bilaterally, with no wheezes, no rales, no conversational dyspnea  Abdomen:  Soft, active bowel sounds, non-tender to palpation, non-distended  Extremities:  Pulses equal bilaterally.  Capillary refill less than 3 seconds.  No edema in lower extremities bilaterally   Skin:  Warm and dry, not diaphoretic.  No ecchymosis noted.   Neuro:  A&O x 3.  No focal deficits.  Speech intact  Psych:  Cooperative, not agitated    Intake & Output:    Intake/Output Summary (Last 24 hours) at 07/17/2023 1327  Last data filed at 07/17/2023 1205  [name] per 24 hour   Intake 1246.25 ml   Output --   Net 1246.25 ml     I/O current shift:  06/12 0700 - 06/12 1859  In: 1246.25 [I.V.:1246.25]  Out: -   Emesis:    BM:    Date of Last Bowel Movement: 07/17/23  Heme:      acetaminophen   (TYLENOL ) tablet, 650 mg, Oral, Q4H PRN  atorvastatin  (LIPITOR) tablet, 20 mg, Oral, NIGHTLY  Correction/SSIP insulin lispro 100 units/mL injection, 3-14 Units, Subcutaneous, 4x/day AC  dextrose  (GLUTOSE) 40% oral gel, 15 g, Oral, Q15 Min PRN  dextrose  50% (0.5 g/mL) injection - syringe, 12.5 g, Intravenous, Q15 Min PRN  escitalopram  (LEXAPRO ) tablet, 20 mg, Oral, Daily  famotidine  (PEPCID ) tablet, 20 mg, Oral, Daily  glucagon injection 1 mg, 1 mg, IntraMUSCULAR, Once PRN  heparin  25,000 units in D5W 250 mL infusion, 18 Units/kg/hr (Adjusted), Intravenous, Continuous  [Held by provider] hydroCHLOROthiazide  (HYDRODIURIL ) tablet, 25 mg, Oral, QAM  HYDROmorphone  (DILAUDID ) 2 mg/mL injection, 0.5 mg, Intravenous, Q4H PRN  levothyroxine  (SYNTHROID ) tablet, 25 mcg, Oral, QAM  [Held by provider] losartan  (COZAAR ) tablet, 100 mg, Oral, Daily  magnesium  oxide (MAG-OX) 400mg  (241.3 mg elemental magnesium ) tablet, 400 mg, Oral, Daily  methocarbamol  (ROBAXIN ) tablet, 500 mg, Oral, 3x/day PRN  ondansetron  (ZOFRAN ) 2 mg/mL injection, 4 mg, Intravenous, Q6H PRN  perflutren lipid microspheres (DEFINITY) 1.3 mL in NS 10 mL (tot vol) injection, 2 mL, Intravenous, Cardiology Once PRN  phenytoin  sodium extended release (DILANTIN ) capsule, 300 mg,  Oral, Daily  polyethylene glycol (MIRALAX ) oral packet, 17 g, Oral, Daily          Labs:  Recent Results (from the past 48 hours)   CBC WITH DIFF    Collection Time: 07/17/23  5:04 AM   Result Value    WBC 7.3    HGB 12.5    HCT 36.1    PLATELETS 201      No results found for this or any previous visit (from the past 48 hours).   Recent Results (from the past 48 hours)   COMPREHENSIVE METABOLIC PANEL, NON-FASTING    Collection Time: 07/17/23  5:04 AM   Result Value    ALKALINE PHOSPHATASE 110 (H)    ALT (SGPT) 22    AST (SGOT) 17   COMPREHENSIVE METABOLIC PANEL, NON-FASTING    Collection Time: 07/16/23  1:54 PM   Result Value    ALKALINE PHOSPHATASE 121 (H)    ALT (SGPT) 20    AST (SGOT) 15       Results for orders placed or performed during the hospital encounter of 07/16/23 (from the past 48 hours)   TROPONIN IN THREE HOURS    Collection Time: 07/16/23  5:03 PM   Result Value    TROPONIN I 4   B-TYPE NATRIURETIC PEPTIDE (BNP),PLASMA    Collection Time: 07/16/23  1:54 PM   Result Value    BNP 10      Recent Results (from the past 48 hours)   PTT (PARTIAL THROMBOPLASTIN TIME)    Collection Time: 07/17/23 11:13 AM   Result Value    APTT 54.0 (H)      Results for orders placed or performed in visit on 06/25/23 (from the past 8 weeks)   HGA1C (HEMOGLOBIN A1C WITH EST AVG GLUCOSE)    Collection Time: 06/25/23  6:44 AM   Result Value    HEMOGLOBIN A1C 5.1      No results found for this or any previous visit (from the past 48 hours).     Microbiology:  No results found for any visits on 07/16/23 (from the past 96 hours).    Imaging:   ECG 12 LEAD  Normal sinus rhythm  Left posterior fascicular block  Abnormal ECG  No previous ECGs available  Confirmed by Elizabeth [name] ([id number]) on 07/17/2023 8:34:40 AM      Assessment/ Plan:   Active Hospital Problems   (*Primary Problem)    Diagnosis    *Pulmonary embolism    Chest pain, unspecified type    Dyspnea, unspecified type    History of nephrectomy    History of astrocytoma    Clear cell renal cell carcinoma, left (CMS HCC)    Pulmonary nodules     Assessment/Plan:       Active Hospital Problems     Diagnosis    Primary Problem: Pulmonary embolism   #Acute pulmonary embolism  Presented with chest pain, no shortness a breath  CT chest showed acute PE in segmental and subsegmental left lower lobe branches, no signs of heart strain  CBC and CMP unremarkable  PESI [name] is class 2, 84 points, low risk, 1.7-3.5% 30 day mortality  Echo, bilateral lower extremity Dopplers ordered  Heme-Onc team on board  Continue IV heparin  for now     #Renal tumor status post nephrectomy ?  Malignant  Heme-Onc team on board     #brain tumor, status post surgical resection, complicated by CVA,  2015, with residual minimal aphasia  Currently on statin, continue     #Hypothyroidism  On levothyroxine  supplementation, continue     #Epileptic seizure  On phenytoin , continue     #Hypertension  Holding off on losartan  and hydrochlorothiazide   On IV fluids for now     #Diabetes mellitus  On sliding scale, blood glucose appropriately controlled    07/17/23-No change in plan currently. She has finished her echocardiogram, pending reading.         DVT/PE Prophylaxis:  On IV heparin      Nutrition:   DIET REGULAR Do you want to initiate MNT Protocol? Yes             Additional clinical characteristics related to nutrition:    - monitor for weight changes   - monitor intake and output    - monitor bowel functions        Lab Results   Component Value Date    ALBUMIN  4.1 07/17/2023         Disposition Planning:   home  The Hospitalist personally evaluated and examined the patient in conjunction with the MLP and agree with the assessments, treatment plan and disposition of the patient as recorded by the [facility].   Jeramiah Mccaughey, FNP-BC    [place] MEDICINE HOSPITALIST

## 2023-07-17 NOTE — Care Plan (Signed)
 Problem: Wound  Goal: Optimal Coping  Outcome: Ongoing (see interventions/notes)  Goal: Optimal Functional Ability  Outcome: Ongoing (see interventions/notes)  Goal: Absence of Infection Signs and Symptoms  Outcome: Ongoing (see interventions/notes)  Goal: Improved Oral Intake  Outcome: Ongoing (see interventions/notes)  Goal: Optimal Pain Control and Function  Outcome: Ongoing (see interventions/notes)  Goal: Skin Health and Integrity  Outcome: Ongoing (see interventions/notes)  Goal: Optimal Wound Healing  Outcome: Ongoing (see interventions/notes)     Problem: Adult Inpatient Plan of Care  Goal: Plan of Care Review  Outcome: Ongoing (see interventions/notes)  Goal: Patient-Specific Goal (Individualized)  Outcome: Ongoing (see interventions/notes)  Flowsheets (Taken 07/17/2023 0942)  Individualized Care Needs:   monitor labs   VS   monitor for seizure activity   assist with ADLs as indicated  Anxieties, Fears or Concerns: none voiced  Goal: Absence of Hospital-Acquired Illness or Injury  Outcome: Ongoing (see interventions/notes)  Intervention: Identify and Manage Fall Risk  Recent Flowsheet Documentation  Taken 07/17/2023 0942 by Hilario Lover, RN  Safety Promotion/Fall Prevention:   fall prevention program maintained   nonskid shoes/slippers when out of bed   safety round/check completed  Intervention: Prevent and Manage VTE (Venous Thromboembolism) Risk  Recent Flowsheet Documentation  Taken 07/17/2023 0942 by Hilario Lover, RN  VTE Prevention/Management: anticoagulant therapy adjusted  Goal: Optimal Comfort and Wellbeing  Outcome: Ongoing (see interventions/notes)  Goal: Rounds/Family Conference  Outcome: Ongoing (see interventions/notes)     Problem: Health Knowledge, Opportunity to Enhance (Adult,Obstetrics,Pediatric)  Goal: Knowledgeable about Health Subject/Topic  Description: Patient will demonstrate the desired outcomes by discharge/transition of care.  Outcome: Ongoing (see interventions/notes)     Problem: Fall  Injury Risk  Goal: Absence of Fall and Fall-Related Injury  Outcome: Ongoing (see interventions/notes)  Intervention: Promote Injury-Free Environment  Recent Flowsheet Documentation  Taken 07/17/2023 0942 by Hilario Lover, RN  Safety Promotion/Fall Prevention:   fall prevention program maintained   nonskid shoes/slippers when out of bed   safety round/check completed     Problem: Skin Injury Risk Increased  Goal: Skin Health and Integrity  Outcome: Ongoing (see interventions/notes)     Problem: Comorbidity Management  Goal: Blood Glucose Level Within Target Range  Outcome: Ongoing (see interventions/notes)  Goal: Maintenance of Seizure Control  Outcome: Ongoing (see interventions/notes)

## 2023-07-17 NOTE — Care Plan (Signed)
 Problem: Wound  Goal: Optimal Coping  07/17/2023 1604 by Hilario Lover, RN  Outcome: Ongoing (see interventions/notes)  07/17/2023 1142 by Hilario Lover, RN  Outcome: Ongoing (see interventions/notes)  Goal: Optimal Functional Ability  07/17/2023 1604 by Hilario Lover, RN  Outcome: Ongoing (see interventions/notes)  07/17/2023 1142 by Hilario Lover, RN  Outcome: Ongoing (see interventions/notes)  Goal: Absence of Infection Signs and Symptoms  07/17/2023 1604 by Hilario Lover, RN  Outcome: Ongoing (see interventions/notes)  07/17/2023 1142 by Hilario Lover, RN  Outcome: Ongoing (see interventions/notes)  Goal: Improved Oral Intake  07/17/2023 1604 by Hilario Lover, RN  Outcome: Ongoing (see interventions/notes)  07/17/2023 1142 by Hilario Lover, RN  Outcome: Ongoing (see interventions/notes)  Goal: Optimal Pain Control and Function  07/17/2023 1604 by Hilario Lover, RN  Outcome: Ongoing (see interventions/notes)  07/17/2023 1142 by Hilario Lover, RN  Outcome: Ongoing (see interventions/notes)  Goal: Skin Health and Integrity  07/17/2023 1604 by Hilario Lover, RN  Outcome: Ongoing (see interventions/notes)  07/17/2023 1142 by Hilario Lover, RN  Outcome: Ongoing (see interventions/notes)  Goal: Optimal Wound Healing  07/17/2023 1604 by Hilario Lover, RN  Outcome: Ongoing (see interventions/notes)  07/17/2023 1142 by Hilario Lover, RN  Outcome: Ongoing (see interventions/notes)     Problem: Adult Inpatient Plan of Care  Goal: Plan of Care Review  07/17/2023 1604 by Hilario Lover, RN  Outcome: Ongoing (see interventions/notes)  07/17/2023 1142 by Hilario Lover, RN  Outcome: Ongoing (see interventions/notes)  Goal: Patient-Specific Goal (Individualized)  07/17/2023 1604 by Hilario Lover, RN  Outcome: Ongoing (see interventions/notes)  07/17/2023 1142 by Hilario Lover, RN  Outcome: Ongoing (see interventions/notes)  Flowsheets (Taken 07/17/2023 [phone])  Individualized Care Needs:   monitor labs   VS   monitor for seizure activity   assist with ADLs as indicated  Anxieties, Fears or Concerns: none voiced  Goal: Absence of Hospital-Acquired Illness or  Injury  07/17/2023 1604 by Hilario Lover, RN  Outcome: Ongoing (see interventions/notes)  07/17/2023 1142 by Hilario Lover, RN  Outcome: Ongoing (see interventions/notes)  Intervention: Identify and Manage Fall Risk  Recent Flowsheet Documentation  Taken 07/17/2023 0942 by Hilario Lover, RN  Safety Promotion/Fall Prevention:   fall prevention program maintained   nonskid shoes/slippers when out of bed   safety round/check completed  Intervention: Prevent and Manage VTE (Venous Thromboembolism) Risk  Recent Flowsheet Documentation  Taken 07/17/2023 0942 by Hilario Lover, RN  VTE Prevention/Management: anticoagulant therapy adjusted  Taken 07/17/2023 0800 by Hilario Lover, RN  Sequential Compression Device (SCDs): Sequential compression device off  Goal: Optimal Comfort and Wellbeing  07/17/2023 1604 by Hilario Lover, RN  Outcome: Ongoing (see interventions/notes)  07/17/2023 1142 by Hilario Lover, RN  Outcome: Ongoing (see interventions/notes)  Goal: Rounds/Family Conference  07/17/2023 1604 by Hilario Lover, RN  Outcome: Ongoing (see interventions/notes)  07/17/2023 1142 by Hilario Lover, RN  Outcome: Ongoing (see interventions/notes)     Problem: Health Knowledge, Opportunity to Enhance (Adult,Obstetrics,Pediatric)  Goal: Knowledgeable about Health Subject/Topic  Description: Patient will demonstrate the desired outcomes by discharge/transition of care.  07/17/2023 1604 by Hilario Lover, RN  Outcome: Ongoing (see interventions/notes)  07/17/2023 1142 by Hilario Lover, RN  Outcome: Ongoing (see interventions/notes)     Problem: Fall Injury Risk  Goal: Absence of Fall and Fall-Related Injury  07/17/2023 1604 by Hilario Lover, RN  Outcome: Ongoing (see interventions/notes)  07/17/2023 1142 by Hilario Lover, RN  Outcome: Ongoing (see interventions/notes)  Intervention: Promote Injury-Free Environment  Recent Flowsheet Documentation  Taken 07/17/2023 [phone] by Antony Baumgartner  B, RN  Safety Promotion/Fall Prevention:   fall prevention program maintained   nonskid shoes/slippers when out of bed   safety round/check completed     Problem:  Skin Injury Risk Increased  Goal: Skin Health and Integrity  07/17/2023 1604 by Hilario Lover, RN  Outcome: Ongoing (see interventions/notes)  07/17/2023 1142 by Hilario Lover, RN  Outcome: Ongoing (see interventions/notes)     Problem: Comorbidity Management  Goal: Blood Glucose Level Within Target Range  07/17/2023 1604 by Hilario Lover, RN  Outcome: Ongoing (see interventions/notes)  07/17/2023 1142 by Hilario Lover, RN  Outcome: Ongoing (see interventions/notes)  Goal: Maintenance of Seizure Control  07/17/2023 1604 by Hilario Lover, RN  Outcome: Ongoing (see interventions/notes)  07/17/2023 1142 by Hilario Lover, RN  Outcome: Ongoing (see interventions/notes)

## 2023-07-17 NOTE — Care Plan (Signed)
 Problem: Wound  Goal: Optimal Coping  Outcome: Ongoing (see interventions/notes)  Goal: Optimal Functional Ability  Outcome: Ongoing (see interventions/notes)  Goal: Absence of Infection Signs and Symptoms  Outcome: Ongoing (see interventions/notes)  Goal: Improved Oral Intake  Outcome: Ongoing (see interventions/notes)  Goal: Optimal Pain Control and Function  Outcome: Ongoing (see interventions/notes)  Goal: Skin Health and Integrity  Outcome: Ongoing (see interventions/notes)  Goal: Optimal Wound Healing  Outcome: Ongoing (see interventions/notes)     Problem: Adult Inpatient Plan of Care  Goal: Plan of Care Review  Outcome: Ongoing (see interventions/notes)  Goal: Patient-Specific Goal (Individualized)  Outcome: Ongoing (see interventions/notes)  Goal: Absence of Hospital-Acquired Illness or Injury  Outcome: Ongoing (see interventions/notes)  Goal: Optimal Comfort and Wellbeing  Outcome: Ongoing (see interventions/notes)  Goal: Rounds/Family Conference  Outcome: Ongoing (see interventions/notes)     Problem: Health Knowledge, Opportunity to Enhance (Adult,Obstetrics,Pediatric)  Goal: Knowledgeable about Health Subject/Topic  Description: Patient will demonstrate the desired outcomes by discharge/transition of care.  Outcome: Ongoing (see interventions/notes)     Problem: Fall Injury Risk  Goal: Absence of Fall and Fall-Related Injury  Outcome: Ongoing (see interventions/notes)

## 2023-07-17 NOTE — Care Plan (Signed)
 Medical Nutrition Therapy Assessment    Reason for assessment: cancer    SUBJECTIVE : Patient reports often having a fair to good appetite.  She reports taking Ozempic for about a year.  Her glucose is often in 100's. She's had no significant weight loss from UBW 200 lbs.  She denies difficulty chewing or swallowing.  Denies GI problems.   She denies difficulty eating yesterday.  Her intake was not good today due to not feeling well.    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 Yes    Height Used for Calculations: 157.5 cm (5' 2)  Weight Used For Calculations: 89.4 kg (197 lb)  BMI (kg/m2): 36.11  Ideal Body Weight (IBW) (kg): 50.43  % Ideal Body Weight: 177.19    Estimated Needs:  Energy Calorie Requirements: 1500 kcal/day (30 kcal/kg IBW)  Protein Requirements (gms/day): 60 gm/day (1.2 gm/kg IBW)    Comments: Admission problem includes acute pulmonary embolism.  Na 139, K 3.8, GFR 99, Gluc 135    Plan/Interventions :   Answered patient's dietary questions.  Discussed and encouraged diabetic, low sodium diet with portion control along with adequate daily nutrients particularly due to recent surgery and suspected cancer.  Recommend monitoring potential negative appetite/intake side effects r/t continuing Ozempic.    Nutrition Diagnosis: Inadequate energy intake related to Current medical condition as evidenced by Decreased oral intake     Elin Guan, RDLD

## 2023-07-17 NOTE — Nurses Notes (Signed)
 Patient had a TTE with contrast. Patient reported feeling severe pain in her entire back of 10/ 10 and relating to having back labor. Patient's VS were: 100.0 orally; 78; 20; 179/91; MAP 108. Patient is S/P left nephrectomy, one month ago, and had concerns about kidney issue/ reaction to contrast. MD notified and stated that the contrast had low risk of side effects and was not dependent upon kidney function. New order for Dilaudid  IVP x1 dose placed. Patient informed and received the dose as ordered. Will continue to monitor.

## 2023-07-17 NOTE — OT Evaluation (Signed)
 [facility]  [address]  [place], [address]  [phone]  (Fax) [phone]  Rehabilitation Services  Occupational Therapy Inpatient Initial Evaluation      Patient Name: Elizabeth [name]  Date of Birth: July 30, 1970  Height: Height: 157.5 cm (5' 2.01)  Weight: Weight: 89.4 kg (197 lb)  Room/Bed: 428/A  Payor: [employer]UE [employer] / Plan: [place] [employer] PPO / Product Type: PPO /         PMH:   Past Medical History:   Diagnosis Date    Anemia     Cancer (CMS HCC)     brain tumor 8/15 with chemo and radiation    CPAP (continuous positive airway pressure) dependence     CVA (cerebrovascular accident)     mini during brain surgery per pt    Diabetes mellitus, type 2     Disorder of thyroid     Epileptic seizure (CMS [facility])     Essential hypertension     Hx of breast cancer     Pulmonary embolism 07/16/2023    Sleep apnea            Assessment:      Functional Level at Time of Session: (P) Patient is a pleasant 53 year old female admitted for pulmonary embolism. Prior to admission, she was independent in ADLs and used a cane for functional mobility. During OT eval, patient was alert, oriented x4, cooperative and able to follow multistep commands. She has full UB AROM and good (4/5) UB strength. She was Mod I to transfer from supine-sit-supine and sit-stand-sit. She required SBA with LB dressing. OT To follow up during acute stay for ADL training, conditioning and functional transfer training.    Discharge Needs:   Equipment Recommendation:  TBD    The patient presents with mobility limitations due to impaired strength and impaired functional activity tolerance that significantly impair/prevent patient's ability to participate in mobility-related activities of daily living (MRADLs) including  ambulation and transfers in order to safely complete, toileting, bathing, safely entering/exiting the home, in reasonable time. This functional mobility deficit can be sufficiently resolved  with the use of a Anticipated Equipment Needs at Discharge: (P) to be determined  in order to decrease the risk of falls, morbidity, and mortality in performance of these MRADLs.  Patient is able to safely use this assistive device.    Discharge Disposition:  home with home health     JUSTIFICATION OF DISCHARGE RECOMMENDATION   Based on current diagnosis, functional performance prior to admission, and current functional performance, this patient requires continued OT services in Anticipated Discharge Disposition: (P) home with home health  in order to achieve significant functional improvements.    Plan:   Current Intervention:  Predicted Duration of Therapy: (P) until discharge    To provide Occupational therapy services  Therapy Frequency: (P) minimum of 1x/week for duration of Predicted Duration of Therapy: (P) until discharge  .       The risks/benefits of therapy have been discussed with the patient/caregiver and he/she is in agreement with the established plan of care.       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 with chemo and radiation    CPAP (continuous positive airway pressure) dependence     CVA (cerebrovascular accident)  mini during brain surgery per pt    Diabetes mellitus, type 2     Disorder of thyroid     Epileptic seizure (CMS [facility])     Essential hypertension     Hx of breast cancer     Pulmonary embolism 07/16/2023    Sleep apnea          SURGICAL HISTORY:   Past Surgical History:   Procedure Laterality Date    BRAIN SURGERY      HX CHOLECYSTECTOMY         ipp    INSERT FLOW SHEET     07/17/23 0911   Rehab Session   Document Type evaluation   OT Visit Date 07/17/23   Total OT Minutes: 20  (OT/PT co-eval)   Patient Effort good   General Information   Patient Profile Reviewed yes   Medical Lines Telemetry;PIV Line   Respiratory Status room air   Pre Treatment Status   Pre Treatment Patient Status  Patient supine in bed;Call light within reach;Telephone within reach;Patient safety alarm activated;Nurse approved session   Support Present Pre Treatment  None   Communication Pre Treatment  Charge Nurse   Communication Pre Treatment Comment Cleared for OT   Mutuality/Individual Preferences   Anxieties, Fears or Concerns None voiced   Individualized Care Needs Assistance with ADLs   Patient-Specific Goals (Include Timeframe) DC when able   Plan of Care Reviewed With patient   Living Environment   Lives With child(ren), adult   Living Arrangements apartment   Home Accessibility stairs within home;tub/shower is not walk in   Living Environment Comment She has a tub and shower chair   Stairs Within Home, Primary   Number of Stairs, Within Home, Primary other (see comments)  (12)   Functional Level Prior   Ambulation 1 - assistive equipment  (SPC)   Transferring 0 - independent   Toileting 0 - independent   Bathing 1 - assistive equipment  (shower chair)   Dressing 0 - independent   Eating 0 - independent   Communication 0 - understands/communicates without difficulty   Swallowing 0-->swallows foods/liquids without difficulty   Self-Care   Dominant Hand right   Vital Signs   Pre-Treatment Heart Rate (beats/min) 89   Post-treatment Heart Rate (beats/min) 89   Pre SpO2 (%) 93   O2 Delivery Pre Treatment room air   Post SpO2 (%) 95   O2 Delivery Post Treatment room air   Coping/Psychosocial   Observed Emotional State calm;cooperative   Verbalized Emotional State acceptance   Cognition   Behavior/Mood Observations behavior appropriate to situation, WNL/WFL   Orientation Status oriented x 4   Attention WNL/WFL   Follows Commands WFL   Vision Assessment/Interventions   Visual Impairment/Limitations WFL with corrective lenses   RUE Assessment   RUE Assessment WFL- Within Functional Limits   LUE Assessment   LUE Assessment WFL- Within Functional Limits   Bed Mobility   Supine-Sit Independence modified independence   Sit to  Supine, Independence modified independence   Transfer Assessment/Treatment   Sit-Stand Independence modified independence   Stand-Sit Independence modified independence   Sit-Stand-Sit, Assist Device walker, front wheeled   Lower Body Dressing Assessment/Training   Position sitting   DRESSING ASSESSED Don Socks;Doff Socks   Independence Level  standby assist   Post Treatment Status   Post Treatment Patient Status Patient supine in bed;Call light within reach;Telephone within reach;Patient safety alarm activated   Support Present Post Treatment  None  Film/video editor Nurse   Care Plan Goals   OT Rehab Goals Bathing Goal;Grooming Goal;LB Dressing Goal;Toileting Goal;UB Dressing Goal   Bathing Goal   Bathing Goal, Date Established 07/17/23   Bathing Goal, Time to Achieve by discharge   Bathing Goal, Activity Type all bathing tasks   Bathing Goal, Independence Level modified independence   Bathing Goal, Adaptive Equipment shower chair   Grooming Goal   Grooming Goal, Date Established 07/17/23   Grooming Goal, Time to Achieve by discharge   Grooming Goal, Activity Type all grooming tasks   Grooming Goal, Independence  independent   LB Dressing Goal   LB Dressing Goal, Date Established 07/17/23   LB Dressing Goal, Time to Achieve by discharge   LB Dressing Goal, Activity Type all lower body dressing tasks   LB Dressing Goal, Independence Level modified independence   Toileting Goal   Toileting Goal, Date Established 07/17/23   Toileting Goal, Time to Achieve by discharge   Toileting Goal, Activity Type all toileting tasks   Toileting Goal, Independence Level modified independence   UB Dressing Goal   UB Dressing  Goal, Date Established 07/17/23   UB Dressing Goal, Time to Achieve by discharge   UB Dressing Goal, Activity Type all upper body dressing tasks   UB Dressing Goal, Independence Level independent   Planned Therapy Interventions, OT Eval   Planned Therapy Interventions ADL retraining;strengthening    Clinical Impression   Functional Level at Time of Session Patient is a pleasant 53 year old female admitted for pulmonary embolism. Prior to admission, she was independent in ADLs and used a cane for functional mobility. During OT eval, patient was alert, oriented x4, cooperative and able to follow multistep commands. She has full UB AROM and good (4/5) UB strength. She was Mod I to transfer from supine-sit-supine and sit-stand-sit. She required SBA with LB dressing. OT To follow up during acute stay for ADL training, conditioning and functional transfer training.   Criteria for Skilled Therapeutic Interventions Met (OT) yes;meets criteria;skilled treatment is necessary   Rehab Potential good   Therapy Frequency minimum of 1x/week   Predicted Duration of Therapy until discharge   Anticipated Equipment Needs at Discharge to be determined   Anticipated Discharge Disposition home with home health   Evaluation Complexity Justification   Occupational Profile Review Brief history   Performance Deficits 1-3 deficits   Clinical Decision Making Low analytic complexity   Evaluation Complexity Low       TREATMENT PLAN: THERAPEUTIC EXERCISES, THERAPEUTIC ACTIVITIES, and ADL/IADL TRAINING  EVALUATION COMPLEXITY: CLINICAL DECISION MAKING OF LOW COMPLEXITY AS INDICATED BY PMH, OCCUPATIONAL THERAPY ASSESSMENT OF MUSCULOSKELETAL AND NEUROLOGICAL SYSTEMS AND ACTIVITY LIMITATIONS. CLINICAL PRESENTATION IS STABLE AND UNCOMPLICATED.      EVALUATION 10 minutes    Therapist:      Syon Tews, OT,07/17/2023 11:38

## 2023-07-17 NOTE — Consults (Signed)
 Department of Hematology/Oncology   ONCOLOGY CONSULT NOTE      REFERRING PROVIDER:  Hospitalist    Reason for Consult: PE  PCP: [facility]    HISTORY OF PRESENT ILLNESS:  Elizabeth [name] is a 53 y.o. female who presented to the hospital for evaluation of Left anterior chest pain; midsternal just right of left breast toward middle denied associated sob, and now has resolved prior to my eval.  Hematology/oncology consultation was requested for evaluation of PE .     Patient is not known to us  at [facility] The patient was treated by Dr. Lyda Samples years ago.     Denies breast cancer hx .    Pertinent Oncology history as follows:  09/12/13 MRI brain w/wo: Redemonstration of a cortically based mass (1st paragraph) with considerations as above (2nd paragraph).  Vasogenic edema, as above (3rd paragraph). Relationships to the superior lateral fasciculus (arcuate fasciculus), as above (4th paragraph).   09/15/13  MRI brain w/wo: Early subacute infarctions (3rd and 4th paragraphs). Expected findings at the site of the recently resected mass. Minimal background white matter changes (5th paragraph). Paranasal sinus mucosal disease (7th/last paragraph).   12/21/13 MRI brain w/wo: Maturing postsurgical (1st paragraph) and ischemic (2nd paragraph) insults. No compelling MRI evidence of recurrence/progression  --  continued clinical and MRI followup at intervals deemed clinically appropriate would be advised.   04/25/14 MRI brain w/wo: Evidence of prior left frontal craniotomy again noted. Although continued evolution of the postoperative changes within the surgical bed are demonstrated, with significant interval resolution of enhancement along the margins of the resection cavity, 2, new adjacent 5 mm and 4.5 mm ovoid enhancing nodules are seen along posteromedial aspect of the resection cavity on axial image 92 series 10, with one compared with series 11 of the examination of December 21, 2013.  The degree of vasogenic edema in the region of the surgical bed is unchanged. Although there is no MR evidence of abnormal perfusion in this region on cerebral blood volume images, these lesions are too small to be detected on the perfusion sequence. If the patient has had prior radiation therapy, radiation necrosis could be considered. Otherwise, these nodular enhancing foci are suspicious for recurrent tumor. Suggest close interval followup evaluation.  Interval demonstration of mild bilateral periventricular deep white matter T2 and FLAIR hyperintensity, most likely representing post therapy changes.  No evidence of acute infarction. No hydrocephalus. No evidence of acute intracranial hemorrhage.  12/16/14 MRI brain w/wo:  There is no compelling/definite MRI or MR spectroscopy evidence that the enhancing tissue at the previously surgerized site represents residual/recurrent tumor. 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 Morphologically, this region remains suspicious. Continued close clinical and imaging surveillance would be advised. Followup MRI with and without gadolinium enhancement in an additional approximately 2-3 months (or sooner if clinically warranted) would be advised.   04/05/15 MRI brain w/wo: Overall, similar as compared to 16 December 2014  --The enhancing nodular focus at the surgerized site is grossly similar and may pertain to radiation necrosis (2nd and 3rd paragraphs); there is again no compelling spectroscopy evidence of high-grade tumor. Stigmata of previous infarctions are also similar (4th paragraph). White matter changes presumed related to treatment do continue to progress.  12/22/15 MRI brain w/wo: No compelling MRI evidence of residual/recurrent tumor on either study.  Chronic/mature craniotomy site (1st paragraph).  Chronic/mature insults (2nd paragraph).  Chronic white matter changes (4th paragraph).  Paranasal sinus mucosal disease, as above (7th  paragraph).  03/09/23 MRI brain w/wo:  STABLE CONTRAST-ENHANCED BRAIN MRI. NO EVIDENCE OF RESIDUAL/RECURRENT DISEASE. NO ACUTE INTRACRANIAL ABNORMALITY.  04/15/23 presetend to ED w/ Right flank pain:   Patient states that she has had flank pain that radiates down for the last couple of days has taken Tylenol  and Motrin for this without relief of pain. Reports that she is having difficulty urinating, reports it is just a trickle   06/12/23 PET scan: There is a large mass with rim-like activity associated with the lateral mid to inferior left kidney. Thickness of the hypermetabolic wall is up to about 1.7 cm. Max SUV of the tumor active tissue ranges up to about 4.4. This tumor grossly measures about 10.9 x 11.6 cm on this study. Study shows some enlargement of the left renal vein extending across midline. Max SUV in the vessel is up to about 4.3. There is also enlargement of the left ovarian vein, without much internal activity. There is mild left pelviectasis and collecting system prominence. There is a 1.4 cm nodule adjacent to the mass at about image 200, which is not significantly hypermetabolic. No abnormal activity is seen associated with the left craniotomy. No other hypermetabolic lytic, destructive or sclerotic bony lesions are seen.  06/15/23 MRI renal vein tumor thrombus w/wo: Large mid to lower left renal mass measuring up to 14.9 cm with expansile left renal vein tumor thrombus extends across the midline and protrudes into the IVC as well as into the superior aspect of the left ovarian vein (level I thrombus). Prominent venous collaterals in the left upper quadrant in association with the left renal vein tumor thrombus. Approximately 7 mm pleural-based right lower lobe pulmonary nodule, concerning for pulmonary metastasis. 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 No high-risk or suspicious imaging features are present. Follow-up pancreatic protocol MRI with MRCP is recommended  in 12 months.  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 No evidence for right heart strain . Noncalcified pulmonary nodules again noted bilaterally stable from recent PET/CT measuring up to 1 cm in the right middle lobe     ROS:   Consitutional: energy level fair, appetite good, no weight loss, no fever, no chills  HEENT: No epistaxis, no dysphagia, no gum bleeding  Cardiovascular: + Resolved chest pain, no palpitations, no orthopnea  Respiratory: No cough, no hemoptysis, no dyspnea, no pleurisy, no wheezing  Gastrointestinal: No nausea, no vomiting, no hematemesis, no diarrhea, no constipation, no melena  Musculoskeletal: No swollen joints, no joint redness  Skin: No Rash, No nodules, no pruritus, no lesions  Neurologic: No seizures, no headache, no paresthesia  Psychiatric: no depression, no anxiety  Endocrine: no polyuria, no polydipsia    HISTORY:  Past Medical History:   Diagnosis Date    Anemia     Cancer (CMS HCC)     brain tumor 8/15 with chemo and radiation    CPAP (continuous positive airway pressure) dependence     CVA (cerebrovascular accident)     mini during brain surgery per pt    Diabetes mellitus, type 2     Disorder of thyroid     Epileptic seizure (CMS HCC)     Essential hypertension     Hx of breast cancer     Pulmonary  embolism 07/16/2023    Sleep apnea          Past Surgical History:   Procedure Laterality Date    BRAIN SURGERY      HX CHOLECYSTECTOMY           Social History     Socioeconomic History    Marital status: Divorced     Spouse name: Not on file    Number of children: Not on file    Years of education: Not on file    Highest education level: Not on file   Occupational History    Not on file   Tobacco Use    Smoking status:  Never    Smokeless tobacco: Never   Vaping Use    Vaping status: Never Used   Substance and Sexual Activity    Alcohol use: Never    Drug use: Never    Sexual activity: Not on file   Other Topics Concern    Ability to Walk 1 Flight of Steps without SOB/CP Yes    Routine Exercise No    Ability to Walk 2 Flight of Steps without SOB/CP No    Unable to Ambulate Not Asked    Total Care Not Asked    Ability To Do Own ADL's Yes    Uses Walker Not Asked    Other Activity Level Not Asked    Uses Cane Not Asked   Social History Narrative    Not on file     Social Determinants of Health     Financial Resource Strain: Not on file   Transportation Needs: Not on file   Social Connections: Low Risk  (07/16/2023)    Social Connections     SDOH Social Isolation: 5 or more times a week   Intimate Partner Violence: Not on file   Housing Stability: Not on file     Family Medical History:       Problem Relation (Age of Onset)    Arthritis-osteo Mother    Breast Cancer Paternal Grandmother    Heart Disease Father    Hypertension (High Blood Pressure) Mother    No Known Problems Sister, Brother, Maternal Grandmother, Maternal Grandfather, Paternal Grandfather, Daughter, Son, Maternal Aunt, Maternal Uncle, Paternal Aunt, Paternal Uncle, Other            Outpatient Medications Marked as Taking for the 07/16/23 encounter [facility] Encounter)   Medication Sig    atorvastatin  (LIPITOR) 20 mg Oral Tablet Take 1 Tablet (20 mg total) by mouth Every night    chromic chloride (CHROMIUM CHLORIDE ORAL) Take 1 Tablet by mouth Daily    CINNAMON BARK EXTRACT ORAL Take 2 Capsules by mouth Daily    ergocalciferol, vitamin D2, (DRISDOL) 1,250 mcg (50,000 unit) Oral Capsule Take 1 Capsule (50,000 Units total) by mouth Every Sunday    escitalopram  oxalate (LEXAPRO ) 20 mg Oral Tablet Take 1 Tablet (20 mg total) by mouth Daily    famotidine  (PEPCID ) 20 mg Oral Tablet Take 1 Tablet (20 mg total) by mouth Daily    hydroCHLOROthiazide  (HYDRODIURIL ) 25 mg Oral  Tablet Take 1 Tablet (25 mg total) by mouth Every morning    levothyroxine  (SYNTHROID ) 25 mcg Oral Tablet TAKE 1 TABLET ONCE A DAY IN THE MORNING 30 MINUTES BEFORE MEALS OR ANY OTHER MEDICATIONS    losartan  (COZAAR ) 100 mg Oral Tablet Take 1 Tablet (100 mg total) by mouth Daily    magnesium  Oxide 420 mg Oral Tablet Take 1 Tablet (420 mg total)  by mouth Daily    methocarbamoL  (ROBAXIN ) 500 mg Oral Tablet Take 1 Tablet (500 mg total) by mouth Three times a day as needed    phenytoin  sodium extended release (DILANTIN ) 100 mg Oral Capsule TAKE 3 CAPSULES BY MOUTH TWICE A DAY    semaglutide (OZEMPIC) 0.25 mg or 0.5 mg(2 mg/1.5 mL) Subcutaneous Pen Injector Inject 0.5 mg under the skin Every 7 days      Allergies[1]    PHYSICAL EXAM:    BP (!) 148/83   Pulse 80   Temp 36.1 C (97 F)   Resp 18   Ht 1.575 m (5' 2.01)   Wt 89.4 kg (197 lb)   SpO2 95%   BMI 36.02 kg/m     General: Awake, alert, NAD  HEENT: Head normocephalic, atraumatic.  Mucouse membranes moist.  PERRLA, no pallor, no icterus  Adenopathy: No palpable cervical, axillary, or inguinal adenopathy noted  Neck: No JVD, Supple, no adenopathy, No JVD  Lungs: Clear to auscultation bilaterally.  No rales, rhonchi, no wheezing  Cardiovascular: Regular rate and rhythm, No murmur, no friction rub  Abdomen: Soft, Symmetric, No tenderness, No hepatosplenomegaly, BSA x4  Extremities: no peripheral edema. Warm. No calf tenderness  Skin: Skin warm and dry.  No suspicious lesions  Neurologic: Alert and oriented x3. Chronic speech delay, short term memory loss, and intermittent RLE weakness but does not interfere w/ ambulation.   Psych: Mood and affect congruent for age and gender, Denies SI/HI       DIAGNOSTIC DATA:       LABS:     Basic Metabolic Profile    Lab Results   Component Value Date/Time    SODIUM 139 07/17/2023 05:04 AM    POTASSIUM 3.8 07/17/2023 05:04 AM    CHLORIDE 103 07/17/2023 05:04 AM    CO2 28 07/17/2023 05:04 AM    ANIONGAP 8 07/17/2023 05:04 AM     Lab Results   Component Value Date/Time    BUN 9 07/17/2023 05:04 AM    CREATININE 0.72 07/17/2023 05:04 AM    GLUCOSENF 133 (H) 07/17/2023 05:04 AM          CBC  Diff   Lab Results   Component Value Date/Time    WBC 7.3 07/17/2023 05:04 AM    HGB 12.5 07/17/2023 05:04 AM    HCT 36.1 07/17/2023 05:04 AM    PLTCNT 201 07/17/2023 05:04 AM    RBC 3.83 07/17/2023 05:04 AM    MCV 94.0 07/17/2023 05:04 AM    MCHC 34.7 07/17/2023 05:04 AM    MCH 32.7 07/17/2023 05:04 AM    RDW 14.1 07/17/2023 05:04 AM    MPV 7.3 (L) 07/17/2023 05:04 AM    Lab Results   Component Value Date/Time    PMNS 57 07/17/2023 05:04 AM    LYMPHOCYTES 33 07/17/2023 05:04 AM    EOSINOPHIL 4 07/17/2023 05:04 AM    MONOCYTES 7 07/17/2023 05:04 AM    BASOPHILS 1 07/17/2023 05:04 AM    BASOPHILS 0.00 07/17/2023 05:04 AM    PMNABS 4.10 07/17/2023 05:04 AM    LYMPHSABS 2.40 07/17/2023 05:04 AM    EOSABS 0.30 07/17/2023 05:04 AM    MONOSABS 0.50 07/17/2023 05:04 AM            ASSESSMENT:    ICD-10-CM    1. Pulmonary embolism, unspecified chronicity, unspecified pulmonary embolism type, unspecified whether acute cor pulmonale present (CMS HCC)  I26.99       2.  Dyspnea  R06.00 TRANSTHORACIC ECHOCARDIOGRAM - ADULT      3. Pulmonary embolism  I26.99 PERIPHERAL VENOUS DUPLEX - LOWER      4. Chest pain, unspecified type  R07.9       5. History of nephrectomy  Z90.5       6. 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 S/p open left nephrectomy with partial ureterectomy, left adrenalectomy and removal of renal vein tumor thrombus with partial resection of vena cava (pT3b). Just discharged post operatively [facility]/24/23 from [facility]    2.) ?? Pulmonary metastasis via radiological criteria, no gross evidence of metastasis else where via imaging studies.    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 Has seen Dr. Jerona Mooring, follows with him yearly, he sent her  here to be evaluated s/p Left lateral Craniotomy and chemoradiation complete in 2015. complicated by CVA, 2015, with residual minimal aphasia     4.) History of Epilepsy is on Phenytoin     5.) Acute PE of LLL branches, mild to moderate clot burden. Most likely provoked by malignancy and post operative status.   07/16/23 CTA chest Acute pulmonary emboli in segmental and subsegmental left lower lobe branches. No evidence for right heart strain . Noncalcified pulmonary nodules again noted bilaterally stable from recent PET/CT measuring up to 1 cm in the right middle lobe   07/16/23 BLE venous doppler negative for DVt      PLAN:   1. Following discharge patient to f/u w/ oncology of choice, patient would be a candidate for adjuvent Keytruda per NCCN guidelines, if treated as Stage III vs if treating as stage IV   Axitinib + pembrolizumabb (category 1)  Cabozantinib + nivolumabb,c (category 1)  Lenvatinib + pembrolizumabb (category 1)  Ipilimumab + nivolumabb, at discretion of primary oncologist of choice. Highly recommend f/u asap as she is almost 4 weeks post op at this point.  2. Monitor small pulmonary nodules closely w/ routine surveillance/restaging imaging  3. Continue surveillance of hx of astrocytoma per Dr. Jerona Mooring as already arranged  4. CT brain wo  5. Can transition off heparin  at medical team discretion following ct brain, treat w/ loading Eliquis 10 mg po bid x 7 days, followed by Eliquis 5mg  po bid. Duration depends on clinical coarse. It may be a good idea to keep patient on Eliquis long term d/t underlying malignancy. Should def be continued for 3 months minimum as it is most likely provoked by malignancy, recent open nephrectomy, and sedentary life style as she is in the bed/chair > 50% of the day.  6. Further recommendations will be made following work up results     NIKE was given the chance to ask questions, and these were answered to their satisfaction. The patient is welcome to call with any  questions or concerns in the meantime.     Thank you for this consultation and allowing us  to participate in this patients care.    Patient seen independently today, Case was discussed with Primary hematologist/oncologist Dr. Sharon December, The above plan was advised, ordered, and implemented.   Grayce Lazar, FNP-BC  07/17/2023, 08:49    CC:  [facility]  [address]  [facility] [address]    No referring provider defined for this encounter.         [1]  Allergies  Allergen Reactions    Ace Inhibitors      Other reaction(s): Angioedema

## 2023-07-17 NOTE — Care Plan (Signed)
 Problem: Wound  Goal: Skin Health and Integrity  Intervention: Optimize Skin Protection  Recent Flowsheet Documentation  Taken 07/17/2023 2300 by Kallie Orem, RN  Pressure Reduction Techniques: Frequent weight shifting encouraged  Pressure Reduction Devices: Repositioning wedges/pillows utilized  Taken 07/17/2023 2158 by Kallie Orem, RN  Pressure Reduction Techniques: Frequent weight shifting encouraged  Pressure Reduction Devices: Repositioning wedges/pillows utilized  Goal: Optimal Wound Healing  Intervention: Promote Wound Healing  Recent Flowsheet Documentation  Taken 07/17/2023 2300 by Kallie Orem, RN  Pressure Reduction Techniques: Frequent weight shifting encouraged  Pressure Reduction Devices: Repositioning wedges/pillows utilized  Taken 07/17/2023 2158 by Kallie Orem, RN  Pressure Reduction Techniques: Frequent weight shifting encouraged  Pressure Reduction Devices: Repositioning wedges/pillows utilized

## 2023-07-17 NOTE — Nurses Notes (Signed)
 Patient complaining of pain. PRN pain medication given at this time. Will continue to monitor. Care is on going.

## 2023-07-18 ENCOUNTER — Inpatient Hospital Stay (HOSPITAL_COMMUNITY)

## 2023-07-18 DIAGNOSIS — Z7901 Long term (current) use of anticoagulants: Secondary | ICD-10-CM

## 2023-07-18 DIAGNOSIS — R29898 Other symptoms and signs involving the musculoskeletal system: Secondary | ICD-10-CM

## 2023-07-18 LAB — COMPREHENSIVE METABOLIC PANEL, NON-FASTING
ALBUMIN/GLOBULIN RATIO: 1.7 — ABNORMAL HIGH (ref 0.8–1.4)
ALBUMIN: 4.3 g/dL (ref 3.5–5.7)
ALKALINE PHOSPHATASE: 108 U/L — ABNORMAL HIGH (ref 34–104)
ALT (SGPT): 20 U/L (ref 7–52)
ANION GAP: 9 mmol/L (ref 4–13)
AST (SGOT): 13 U/L (ref 13–39)
BILIRUBIN TOTAL: 0.3 mg/dL (ref 0.3–1.0)
BUN/CREA RATIO: 12 (ref 6–22)
BUN: 8 mg/dL (ref 7–25)
CALCIUM, CORRECTED: 9.1 mg/dL (ref 8.9–10.8)
CALCIUM: 9.3 mg/dL (ref 8.6–10.3)
CHLORIDE: 105 mmol/L (ref 98–107)
CO2 TOTAL: 26 mmol/L (ref 21–31)
CREATININE: 0.69 mg/dL (ref 0.60–1.30)
ESTIMATED GFR: 103 mL/min/{1.73_m2} (ref >59)
GLOBULIN: 2.5 (ref 2.0–3.5)
GLUCOSE: 119 mg/dL — ABNORMAL HIGH (ref 74–109)
OSMOLALITY, CALCULATED: 279 mosm/kg (ref 270–290)
POTASSIUM: 3.8 mmol/L (ref 3.5–5.1)
PROTEIN TOTAL: 6.8 g/dL (ref 6.4–8.9)
SODIUM: 140 mmol/L (ref 136–145)

## 2023-07-18 LAB — CBC WITH DIFF
BASOPHIL #: 0.1 10*3/uL (ref 0.00–0.10)
BASOPHIL %: 1 % (ref 0–1)
EOSINOPHIL #: 0.6 10*3/uL — ABNORMAL HIGH (ref 0.00–0.50)
EOSINOPHIL %: 8 % — ABNORMAL HIGH (ref 1–7)
HCT: 36.8 % (ref 31.2–41.9)
HGB: 13.2 g/dL (ref 10.9–14.3)
LYMPHOCYTE #: 2.7 10*3/uL (ref 1.10–3.10)
LYMPHOCYTE %: 37 % (ref 16–46)
MCH: 32.9 pg — ABNORMAL HIGH (ref 24.7–32.8)
MCHC: 36 g/dL — ABNORMAL HIGH (ref 32.3–35.6)
MCV: 91.5 fL (ref 75.5–95.3)
MONOCYTE #: 0.5 10*3/uL (ref 0.20–0.90)
MONOCYTE %: 7 % (ref 4–11)
MPV: 7.1 fL — ABNORMAL LOW (ref 7.9–10.8)
NEUTROPHIL #: 3.5 10*3/uL (ref 1.90–8.20)
NEUTROPHIL %: 47 % (ref 43–77)
PLATELETS: 197 10*3/uL (ref 140–440)
RBC: 4.02 10*6/uL (ref 3.63–4.92)
RDW: 14.1 % (ref 12.3–17.7)
WBC: 7.3 10*3/uL (ref 3.8–11.8)

## 2023-07-18 LAB — PTT (PARTIAL THROMBOPLASTIN TIME): APTT: 63.3 s — ABNORMAL HIGH (ref 25.0–38.0)

## 2023-07-18 LAB — POC BLOOD GLUCOSE (RESULTS)
GLUCOSE, POC: 122 mg/dL — ABNORMAL HIGH (ref 70–100)
GLUCOSE, POC: 121 mg/dL — ABNORMAL HIGH (ref 70–100)
GLUCOSE, POC: 114 mg/dL — ABNORMAL HIGH (ref 70–100)

## 2023-07-18 LAB — MAGNESIUM: MAGNESIUM: 2.1 mg/dL (ref 1.9–2.7)

## 2023-07-18 MED ORDER — APIXABAN 5 MG TABLET
5.0000 mg | ORAL_TABLET | Freq: Two times a day (BID) | ORAL | Status: DC
Start: 2023-07-25 — End: 2023-07-19

## 2023-07-18 MED ORDER — APIXABAN 5 MG TABLET
10.0000 mg | ORAL_TABLET | Freq: Two times a day (BID) | ORAL | Status: DC
Start: 2023-07-18 — End: 2023-07-25
  Administered 2023-07-18 – 2023-07-19 (×3): 10 mg via ORAL
  Filled 2023-07-18 (×3): qty 2

## 2023-07-18 NOTE — Consults (Signed)
 Department of Hematology/Oncology   ONCOLOGY CONSULT NOTE      REFERRING PROVIDER:  Hospitalist    Reason for Consult: PE  PCP: [facility]    HISTORY OF PRESENT ILLNESS:  Elizabeth [name] is a 53 y.o. female who presented to the hospital for evaluation of Left anterior chest pain; midsternal just right of left breast toward middle denied associated sob, and now has resolved prior to my evaluation.  Hematology/oncology consultation was requested for evaluation of PE.     Patient is not known to us  at [facility]. The patient was treated by Dr. Chestine Costain years ago.     Denies breast cancer hx.    Pertinent Oncology history as follows:  09/12/13 MRI brain w/wo: Redemonstration of a cortically based mass (1st paragraph) with considerations as above (2nd paragraph).  Vasogenic edema, as above (3rd paragraph). Relationships to the superior lateral fasciculus (arcuate fasciculus), as above (4th paragraph).   09/15/13  MRI brain w/wo: Early subacute infarctions (3rd and 4th paragraphs). Expected findings at the site of the recently resected mass. Minimal background white matter changes (5th paragraph). Paranasal sinus mucosal disease (7th/last paragraph).   12/21/13 MRI brain w/wo: Maturing postsurgical (1st paragraph) and ischemic (2nd paragraph) insults. No compelling MRI evidence of recurrence/progression  --  continued clinical and MRI followup at intervals deemed clinically appropriate would be advised.   04/25/14 MRI brain w/wo: Evidence of prior left frontal craniotomy again noted. Although continued evolution of the postoperative changes within the surgical bed are demonstrated, with significant interval resolution of enhancement along the margins of the resection cavity, 2, new adjacent 5 mm and 4.5 mm ovoid enhancing nodules are seen along posteromedial aspect of the resection cavity on axial image 92 series 10, with one compared with series 11 of the examination of December 21, 2013.  The degree of vasogenic edema in the region of the surgical bed is unchanged. Although there is no MR evidence of abnormal perfusion in this region on cerebral blood volume images, these lesions are too small to be detected on the perfusion sequence. If the patient has had prior radiation therapy, radiation necrosis could be considered. Otherwise, these nodular enhancing foci are suspicious for recurrent tumor. Suggest close interval followup evaluation.  Interval demonstration of mild bilateral periventricular deep white matter T2 and FLAIR hyperintensity, most likely representing post therapy changes.  No evidence of acute infarction. No hydrocephalus. No evidence of acute intracranial hemorrhage.  12/16/14 MRI brain w/wo:  There is no compelling/definite MRI or MR spectroscopy evidence that the enhancing tissue at the previously surgerized site represents residual/recurrent tumor. 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 Morphologically, this region remains suspicious. Continued close clinical and imaging surveillance would be advised. Followup MRI with and without gadolinium enhancement in an additional approximately 2-3 months (or sooner if clinically warranted) would be advised.   04/05/15 MRI brain w/wo: Overall, similar as compared to 16 December 2014  --The enhancing nodular focus at the surgerized site is grossly similar and may pertain to radiation necrosis (2nd and 3rd paragraphs); there is again no compelling spectroscopy evidence of high-grade tumor. Stigmata of previous infarctions are also similar (4th paragraph). White matter changes presumed related to treatment do continue to progress.  12/22/15 MRI brain w/wo: No compelling MRI evidence of residual/recurrent tumor on either study.  Chronic/mature craniotomy site (1st paragraph).  Chronic/mature insults (2nd paragraph).  Chronic white matter changes (4th paragraph).  Paranasal sinus mucosal disease, as above (7th  paragraph).  03/09/23 MRI brain w/wo:  STABLE CONTRAST-ENHANCED BRAIN MRI. NO EVIDENCE OF RESIDUAL/RECURRENT DISEASE. NO ACUTE INTRACRANIAL ABNORMALITY.  04/15/23 presetend to ED w/ Right flank pain:   Patient states that she has had flank pain that radiates down for the last couple of days has taken Tylenol  and Motrin for this without relief of pain. Reports that she is having difficulty urinating, reports it is just a trickle   06/12/23 PET scan: There is a large mass with rim-like activity associated with the lateral mid to inferior left kidney. Thickness of the hypermetabolic wall is up to about 1.7 cm. Max SUV of the tumor active tissue ranges up to about 4.4. This tumor grossly measures about 10.9 x 11.6 cm on this study. Study shows some enlargement of the left renal vein extending across midline. Max SUV in the vessel is up to about 4.3. There is also enlargement of the left ovarian vein, without much internal activity. There is mild left pelviectasis and collecting system prominence. There is a 1.4 cm nodule adjacent to the mass at about image 200, which is not significantly hypermetabolic. No abnormal activity is seen associated with the left craniotomy. No other hypermetabolic lytic, destructive or sclerotic bony lesions are seen.  06/15/23 MRI renal vein tumor thrombus w/wo: Large mid to lower left renal mass measuring up to 14.9 cm with expansile left renal vein tumor thrombus extends across the midline and protrudes into the IVC as well as into the superior aspect of the left ovarian vein (level I thrombus). Prominent venous collaterals in the left upper quadrant in association with the left renal vein tumor thrombus. Approximately 7 mm pleural-based right lower lobe pulmonary nodule, concerning for pulmonary metastasis. 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 No high-risk or suspicious imaging features are present. Follow-up pancreatic protocol MRI with MRCP is recommended  in 12 months.  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 No evidence for right heart strain . Noncalcified pulmonary nodules again noted bilaterally stable from recent PET/CT measuring up to 1 cm in the right middle lobe     ROS:   Consitutional: energy level fair, appetite good, no weight loss, no fever, no chills  HEENT: No epistaxis, no dysphagia, no gum bleeding  Cardiovascular: + Resolved chest pain, no palpitations, no orthopnea  Respiratory: No cough, no hemoptysis, no dyspnea, no pleurisy, no wheezing  Gastrointestinal: No nausea, no vomiting, no hematemesis, no diarrhea, no constipation, no melena  Musculoskeletal: No swollen joints, no joint redness  Skin: No Rash, No nodules, no pruritus, no lesions  Neurologic: No seizures, no headache, no paresthesia  Psychiatric: no depression, no anxiety  Endocrine: no polyuria, no polydipsia    HISTORY:  Past Medical History:   Diagnosis Date    Anemia     Cancer (CMS HCC)     brain tumor 8/15 with chemo and radiation    Chest pain, unspecified type 07/17/2023    CPAP (continuous positive airway pressure) dependence     CVA (cerebrovascular accident)     mini during brain surgery per pt    Diabetes mellitus, type 2     Disorder of thyroid     Dyspnea, unspecified type 07/17/2023    Epileptic  seizure (CMS [facility])     Essential hypertension     Pulmonary embolism 07/16/2023    Sleep apnea          Past Surgical History:   Procedure Laterality Date    BRAIN SURGERY      HX CHOLECYSTECTOMY           Social History     Socioeconomic History    Marital status: Divorced     Spouse name: Not on file    Number of children: Not on file    Years of education: Not on file    Highest education level: Not on file    Occupational History    Not on file   Tobacco Use    Smoking status: Never    Smokeless tobacco: Never   Vaping Use    Vaping status: Never Used   Substance and Sexual Activity    Alcohol use: Never    Drug use: Never    Sexual activity: Not on file   Other Topics Concern    Ability to Walk 1 Flight of Steps without SOB/CP Yes    Routine Exercise No    Ability to Walk 2 Flight of Steps without SOB/CP No    Unable to Ambulate Not Asked    Total Care Not Asked    Ability To Do Own ADL's Yes    Uses Walker Not Asked    Other Activity Level Not Asked    Uses Cane Not Asked   Social History Narrative    Not on file     Social Determinants of Health     Financial Resource Strain: Not on file   Transportation Needs: Not on file   Social Connections: Low Risk  (07/16/2023)    Social Connections     SDOH Social Isolation: 5 or more times a week   Intimate Partner Violence: Not on file   Housing Stability: Not on file     Family Medical History:       Problem Relation (Age of Onset)    Arthritis-osteo Mother    Breast Cancer Paternal Grandmother    Heart Disease Father    Hypertension (High Blood Pressure) Mother    No Known Problems Sister, Brother, Maternal Grandmother, Maternal Grandfather, Paternal Grandfather, Daughter, Son, Maternal Aunt, Maternal Uncle, Paternal Aunt, Paternal Uncle, Other            Outpatient Medications Marked as Taking for the 07/16/23 encounter [facility] Encounter)   Medication Sig    atorvastatin  (LIPITOR) 20 mg Oral Tablet Take 1 Tablet (20 mg total) by mouth Every night    chromic chloride (CHROMIUM CHLORIDE ORAL) Take 1 Tablet by mouth Daily    CINNAMON BARK EXTRACT ORAL Take 2 Capsules by mouth Daily    ergocalciferol, vitamin D2, (DRISDOL) 1,250 mcg (50,000 unit) Oral Capsule Take 1 Capsule (50,000 Units total) by mouth Every Sunday    escitalopram  oxalate (LEXAPRO ) 20 mg Oral Tablet Take 1 Tablet (20 mg total) by mouth Daily    famotidine  (PEPCID ) 20 mg Oral Tablet Take 1 Tablet (20 mg  total) by mouth Daily    hydroCHLOROthiazide  (HYDRODIURIL ) 25 mg Oral Tablet Take 1 Tablet (25 mg total) by mouth Every morning    levothyroxine  (SYNTHROID ) 25 mcg Oral Tablet TAKE 1 TABLET ONCE A DAY IN THE MORNING 30 MINUTES BEFORE MEALS OR ANY OTHER MEDICATIONS    losartan  (COZAAR ) 100 mg Oral Tablet Take 1 Tablet (100 mg total) by mouth Daily  magnesium  Oxide 420 mg Oral Tablet Take 1 Tablet (420 mg total) by mouth Daily    methocarbamoL  (ROBAXIN ) 500 mg Oral Tablet Take 1 Tablet (500 mg total) by mouth Three times a day as needed    phenytoin  sodium extended release (DILANTIN ) 100 mg Oral Capsule TAKE 3 CAPSULES BY MOUTH TWICE A DAY    semaglutide  (OZEMPIC ) 0.25 mg or 0.5 mg(2 mg/1.5 mL) Subcutaneous Pen Injector Inject 0.5 mg under the skin Every 7 days      Allergies[1]    PHYSICAL EXAM:    BP (!) 143/80   Pulse 86   Temp 36.9 C (98.5 F)   Resp 18   Ht 1.575 m (5' 2.01)   Wt 89.4 kg (197 lb)   SpO2 93%   BMI 36.02 kg/m     General: Awake, alert, NAD  HEENT: Head normocephalic, atraumatic.  Mucouse membranes moist.  PERRLA, no pallor, no icterus  Adenopathy: No palpable cervical, axillary, or inguinal adenopathy noted  Neck: No JVD, Supple, no adenopathy, No JVD  Lungs: Clear to auscultation bilaterally.  No rales, rhonchi, no wheezing  Cardiovascular: Regular rate and rhythm, No murmur, no friction rub  Abdomen: Soft, Symmetric, No tenderness, No hepatosplenomegaly, BSA x4  Extremities: no peripheral edema. Warm. No calf tenderness  Skin: Skin warm and dry.  No suspicious lesions  Neurologic: Alert and oriented x3. Chronic speech delay, short term memory loss, and intermittent RLE weakness but does not interfere w/ ambulation.   Psych: Mood and affect congruent for age and gender, Denies SI/HI       DIAGNOSTIC DATA:         LABS:     Basic Metabolic Profile    Lab Results   Component Value Date/Time    SODIUM 140 07/18/2023 05:44 AM    POTASSIUM 3.8 07/18/2023 05:44 AM    CHLORIDE 105 07/18/2023  05:44 AM    CO2 26 07/18/2023 05:44 AM    ANIONGAP 9 07/18/2023 05:44 AM    Lab Results   Component Value Date/Time    BUN 8 07/18/2023 05:44 AM    CREATININE 0.69 07/18/2023 05:44 AM    GLUCOSENF 119 (H) 07/18/2023 05:44 AM          CBC  Diff   Lab Results   Component Value Date/Time    WBC 7.3 07/18/2023 05:44 AM    HGB 13.2 07/18/2023 05:44 AM    HCT 36.8 07/18/2023 05:44 AM    PLTCNT 197 07/18/2023 05:44 AM    RBC 4.02 07/18/2023 05:44 AM    MCV 91.5 07/18/2023 05:44 AM    MCHC 36.0 (H) 07/18/2023 05:44 AM    MCH 32.9 (H) 07/18/2023 05:44 AM    RDW 14.1 07/18/2023 05:44 AM    MPV 7.1 (L) 07/18/2023 05:44 AM    Lab Results   Component Value Date/Time    PMNS 47 07/18/2023 05:44 AM    LYMPHOCYTES 37 07/18/2023 05:44 AM    EOSINOPHIL 8 (H) 07/18/2023 05:44 AM    MONOCYTES 7 07/18/2023 05:44 AM    BASOPHILS 1 07/18/2023 05:44 AM    BASOPHILS 0.10 07/18/2023 05:44 AM    PMNABS 3.50 07/18/2023 05:44 AM    LYMPHSABS 2.70 07/18/2023 05:44 AM    EOSABS 0.60 (H) 07/18/2023 05:44 AM    MONOSABS 0.50 07/18/2023 05:44 AM            ASSESSMENT:    ICD-10-CM    1. Pulmonary embolism, unspecified chronicity, unspecified pulmonary embolism  type, unspecified whether acute cor pulmonale present (CMS HCC)  I26.99       2. Dyspnea  R06.00 TRANSTHORACIC ECHOCARDIOGRAM - ADULT      3. Pulmonary embolism  I26.99 PERIPHERAL VENOUS DUPLEX - LOWER      4. Chest pain, unspecified type  R07.9       5. History of nephrectomy  Z90.5       6. 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 S/p open left nephrectomy with partial ureterectomy, left adrenalectomy and removal of renal vein tumor thrombus with partial resection of vena cava (pT3b). Just discharged post operatively 06/24/23 from [facility]    2.) ?? Pulmonary metastasis via radiological criteria, no gross evidence of metastasis else where via imaging studies.    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 Has seen Dr. Jerona Mooring, follows with him yearly, he sent her here to be evaluated s/p Left lateral Craniotomy and chemoradiation complete in 2015. complicated by CVA, 2015, with residual minimal aphasia     4.) History of Epilepsy is on Phenytoin     5.) Acute PE of LLL branches, mild to moderate clot burden. Most likely provoked by malignancy and post operative status.   07/16/23 CTA chest Acute pulmonary emboli in segmental and subsegmental left lower lobe branches. No evidence for right heart strain . Noncalcified pulmonary nodules again noted bilaterally stable from recent PET/CT measuring up to 1 cm in the right middle lobe   07/16/23 BLE venous doppler negative for DVt      PLAN:   1. Following discharge patient would like to follow up with [facility] oncology here at [facility]. Patient would be a candidate for adjuvent Keytruda per NCCN guidelines, if treated as Stage III vs if treating as stage IV   Axitinib + pembrolizumabb (category 1)  Cabozantinib + nivolumabb,c (category 1)  Lenvatinib + pembrolizumabb (category 1)  Ipilimumab + nivolumabb, at discretion of primary oncologist. Highly recommend f/u asap as she is almost 4 weeks post op at this point.  2. Monitor small pulmonary nodules closely w/ routine surveillance/restaging imaging  3. Continue surveillance of hx of astrocytoma per Dr. Jerona Mooring as already arranged  4. CT brain wo  5. Can transition off heparin  at medical team discretion following CT brain, treat w/ loading Eliquis 10 mg po bid x 7 days, followed by Eliquis 5mg  po bid. Duration depends on clinical course. It may be a good idea to keep patient on Eliquis long term d/t underlying malignancy. Should be continued for 3 months minimum as it is most likely provoked by malignancy, recent open nephrectomy, and sedentary life style as she is in the bed/chair > 50% of the day.  6. Further recommendations will be made following work up results     NIKE was given the chance to ask  questions, and these were answered to their satisfaction. The patient is welcome to call with any questions or concerns in the meantime.     Thank you for this consultation and allowing us  to participate in this patients care.    Patient seen independently today, Case was discussed with Primary hematologist/oncologist Dr. Sharon December, The above plan was advised, ordered, and implemented.   Urbano Gardener APRN, FNP-BC 6/13/202512:50      CC:  [facility]  [address]  [place]  [place] [address]    No referring provider defined for this encounter.         [1]   Allergies  Allergen Reactions    Ace Inhibitors      Other reaction(s): Angioedema

## 2023-07-18 NOTE — Nurses Notes (Signed)
 Pt c/o heartburn. PRN Mag-al given @ 2308. Pt states she has no heartburn or any other concerns at this time. Care ongoing

## 2023-07-18 NOTE — Nurses Notes (Signed)
 [facility]  [address]  [place], [address]  [phone]  (Fax) [phone]  Rehabilitation Department  Physical Therapy    Restorative Aide Note    Date: 07/18/2023  Patient's Name: Elizabeth [name]  Date of Birth: Dec 01, 1970  Height: Height: 157.5 cm (5' 2.01)  Weight: Weight: 89.4 kg (197 lb)      Total minutes: 15    Patient effort: good    Patient profile reviewed: yes    Medical Lines: TELEMETRY    Respiratory Status:ROOM AIR    Existing precautions/restrictions:FALL PRECAUTIONS    Pre-treatment status: PATIENT SUPINE IN BED    Communication pre-treatment: CHARGE NURSE    Attention/Behavior: WNL/WFL    Pre-treatment HR: 83    Post-treatment HR: 85    PreSpO2: 91    O2 delivery pre-treatment: n/a    Post SpO2: 93    Pre-treatment pain rating: 0/10    Post-treatment pain rating: 0/10    Location of pain: n/a    Bed mobility (roll, bridge, scoot, supine to sit): self to roll and scoot    Sitting assistance:  SBA/CGA    Sit to stand: SBA/CGA    Gait: SBA/CGA ambulated patient 400 feet with wheeled walker, gait belt and one assist /declined up to the chair      Post treatment status: IN HOSPITAL BED, NEEDS IN REACH, BED ALARMED          Elizabeth [name], PAT CARE [facility]  07/18/2023, 12:41

## 2023-07-18 NOTE — Care Plan (Signed)
 Problem: Wound  Goal: Optimal Coping  Outcome: Ongoing (see interventions/notes)  Goal: Optimal Functional Ability  Outcome: Ongoing (see interventions/notes)  Goal: Absence of Infection Signs and Symptoms  Outcome: Ongoing (see interventions/notes)  Goal: Improved Oral Intake  Outcome: Ongoing (see interventions/notes)  Goal: Optimal Pain Control and Function  Outcome: Ongoing (see interventions/notes)  Goal: Skin Health and Integrity  Outcome: Ongoing (see interventions/notes)  Goal: Optimal Wound Healing  Outcome: Ongoing (see interventions/notes)     Problem: Adult Inpatient Plan of Care  Goal: Plan of Care Review  Outcome: Ongoing (see interventions/notes)  Goal: Patient-Specific Goal (Individualized)  Outcome: Ongoing (see interventions/notes)  Goal: Absence of Hospital-Acquired Illness or Injury  Outcome: Ongoing (see interventions/notes)  Goal: Optimal Comfort and Wellbeing  Outcome: Ongoing (see interventions/notes)  Goal: Rounds/Family Conference  Outcome: Ongoing (see interventions/notes)     Problem: Health Knowledge, Opportunity to Enhance (Adult,Obstetrics,Pediatric)  Goal: Knowledgeable about Health Subject/Topic  Description: Patient will demonstrate the desired outcomes by discharge/transition of care.  Outcome: Ongoing (see interventions/notes)     Problem: Fall Injury Risk  Goal: Absence of Fall and Fall-Related Injury  Outcome: Ongoing (see interventions/notes)     Problem: Skin Injury Risk Increased  Goal: Skin Health and Integrity  Outcome: Ongoing (see interventions/notes)     Problem: Comorbidity Management  Goal: Blood Glucose Level Within Target Range  Outcome: Ongoing (see interventions/notes)  Goal: Maintenance of Seizure Control  Outcome: Ongoing (see interventions/notes)

## 2023-07-18 NOTE — Progress Notes (Signed)
 [facility]    HOSPITALIST PROGRESS NOTE    Maile Score  Date of service: 07/18/2023  Date of Admission:  07/16/2023  Hospital Day:  LOS: 2 days     Subjective: Pt seen and examined. 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 She is doing well. No CP, SOB or swelling in her legs. She is on room air and satting 94%. She is on heparin  drip. Will be Dc'd and transitioned to Eliquis.    Vital Signs:  Filed Vitals:    07/18/23 0443 07/18/23 0807 07/18/23 1027 07/18/23 1103   BP: (!) 154/90 (!) 155/86  (!) 143/80   Pulse: 72 85 87 86   Resp: 18 18  18    Temp: 36.8 C (98.2 F) 36.2 C (97.2 F)  36.9 C (98.5 F)   SpO2: 97% 95%  93%        Physical Exam:  General:  Patient in NAD, resting in bed, no visitors present, obese  Head:  Normocephalic, atraumatic  Eyes:  PERRL, anicteric sclera  ENT:  Oral mucosa moist, no nasal discharge   Neck:  Soft, supple, trachea midline  Heart:  RRR, S1 and S2 normal  Lungs:  Unlabored respirations.  Lungs are clear to auscultation bilaterally, with no wheezes, no rales, no conversational dyspnea  Abdomen:  Soft, active bowel sounds, non-tender to palpation, non-distended  Extremities:  Pulses equal bilaterally.  Capillary refill less than 3 seconds.  No edema in lower extremities bilaterally   Skin:  Warm and dry, not diaphoretic.  No ecchymosis noted.   Neuro:  A&O x 3.  No focal deficits.  Speech intact  Psych:  Cooperative, not agitated    Intake & Output:    Intake/Output Summary (Last 24 hours) at 07/18/2023 1136  Last data filed at 07/18/2023 0944  Gross per 24 hour   Intake 1775.74 ml   Output --   Net 1775.74 ml     I/O current shift:  06/13 0700 - 06/13 1859  In: 240 [P.O.:240]  Out: -   Emesis:    BM:     Date of Last Bowel Movement: 07/17/23  Heme:      acetaminophen  (TYLENOL ) tablet, 650 mg, Oral, Q4H PRN  aluminum -magnesium  hydroxide-simethicone  (MAG-AL PLUS) 200-200-20 mg per 5 mL oral liquid, 15 mL, Oral, 4x/day PRN  apixaban (ELIQUIS) tablet, 10 mg, Oral, 2x/day   Followed by  Cecily Cohen ON 07/25/2023] apixaban (ELIQUIS) tablet, 5 mg, Oral, 2x/day  atorvastatin  (LIPITOR) tablet, 20 mg, Oral, NIGHTLY  Correction/SSIP insulin  lispro 100 units/mL injection, 3-14 Units, Subcutaneous, 4x/day AC  dextrose  (GLUTOSE) 40% oral gel, 15 g, Oral, Q15 Min PRN  dextrose  50% (0.5 g/mL) injection - syringe, 12.5 g, Intravenous, Q15 Min PRN  escitalopram  (LEXAPRO ) tablet, 20 mg, Oral, Daily  famotidine  (PEPCID ) tablet, 20 mg, Oral, Daily  glucagon  injection 1 mg, 1 mg, IntraMUSCULAR, Once PRN  [Held by provider] hydroCHLOROthiazide  (HYDRODIURIL ) tablet, 25 mg, Oral, QAM  HYDROmorphone  (DILAUDID ) 2 mg/mL injection, 0.5 mg, Intravenous, Q4H PRN  levothyroxine  (SYNTHROID ) tablet, 25 mcg, Oral, QAM  [Held by provider] losartan  (COZAAR ) tablet, 100 mg, Oral, Daily  magnesium  oxide (MAG-OX) 400mg  (241.3 mg elemental magnesium ) tablet, 400 mg, Oral, Daily  methocarbamol  (ROBAXIN ) tablet, 500 mg, Oral, 3x/day PRN  ondansetron  (ZOFRAN ) 2  mg/mL injection, 4 mg, Intravenous, Q6H PRN  perflutren  lipid microspheres (DEFINITY ) 1.3 mL in NS 10 mL (tot vol) injection, 2 mL, Intravenous, Cardiology Once PRN  phenytoin  sodium extended release (DILANTIN ) capsule, 300 mg, Oral, Daily  polyethylene glycol (MIRALAX ) oral packet, 17 g, Oral, Daily          Labs:  Recent Results (from the past 48 hours)   CBC WITH DIFF    Collection Time: 07/18/23  5:44 AM   Result Value    WBC 7.3    HGB 13.2    HCT 36.8    PLATELETS 197      No results found for this or any previous visit (from the past 48 hours).   Recent Results (from the past 48 hours)   COMPREHENSIVE METABOLIC PANEL, NON-FASTING    Collection Time: 07/18/23  5:44 AM   Result Value    ALKALINE  PHOSPHATASE 108 (H)    ALT (SGPT) 20    AST (SGOT) 13   COMPREHENSIVE METABOLIC PANEL, NON-FASTING    Collection Time: 07/17/23  5:04 AM   Result Value    ALKALINE PHOSPHATASE 110 (H)    ALT (SGPT) 22    AST (SGOT) 17   COMPREHENSIVE METABOLIC PANEL, NON-FASTING    Collection Time: 07/16/23  1:54 PM   Result Value    ALKALINE PHOSPHATASE 121 (H)    ALT (SGPT) 20    AST (SGOT) 15      Results for orders placed or performed during the hospital encounter of 07/16/23 (from the past 48 hours)   TROPONIN IN THREE HOURS    Collection Time: 07/16/23  5:03 PM   Result Value    TROPONIN I 4   B-TYPE NATRIURETIC PEPTIDE (BNP),PLASMA    Collection Time: 07/16/23  1:54 PM   Result Value    BNP 10      Recent Results (from the past 48 hours)   PTT (PARTIAL THROMBOPLASTIN TIME)    Collection Time: 07/18/23  5:44 AM   Result Value    APTT 63.3 (H)      Results for orders placed or performed in visit on 06/25/23 (from the past 8 weeks)   HGA1C (HEMOGLOBIN A1C WITH EST AVG GLUCOSE)    Collection Time: 06/25/23  6:44 AM   Result Value    HEMOGLOBIN A1C 5.1      No results found for this or any previous visit (from the past 48 hours).     Microbiology:  No results found for any visits on 07/16/23 (from the past 96 hours).    Imaging:   CT BRAIN WO IV CONTRAST  Narrative: Wilnette Haste Clear    RADIOLOGIST: Orpah Blackwater    CT BRAIN WO IV CONTRAST performed on 07/18/2023 8:43 AM    CLINICAL HISTORY: hx of astrocytoma now needs full anti-coagulation; RLE weakness; short term memory issues, ensure no bleed prior to dc on eliquis, is currently on a heparin  drip. hx of tumor removal and chemo and rad    TECHNIQUE:  Head CT without intravenous contrast.    COMPARISON: None.    FINDINGS:  There is no acute intracranial hemorrhage, mass effect, or evidence of large acute infarct.    Brain: Unchanged encephalomalacia in the left frontal temporal operculum, sequela of prior infarct or tumor removal. Unchanged extensive bilateral white matter  hypodensity.    CSF Spaces: Normal     Sinuses/Mastoids:  Mucus retention cysts in the bilateral maxillary sinuses.     Bones:  Postsurgical changes in the left frontotemporal calvarium.  Impression: NO ACUTE INTRACRANIAL HEMORRHAGE    One or more dose reduction techniques were used (e.g., Automated exposure control, adjustment of the mA and/or kV according to patient size, use of iterative reconstruction technique).    Radiologist location ID: [id number]      Assessment/ Plan:   Active Hospital Problems   (*Primary Problem)    Diagnosis    *Pulmonary embolism    Chest pain, unspecified type    Dyspnea, unspecified type    History of nephrectomy    History of astrocytoma    Clear cell renal cell carcinoma, left (CMS HCC)    Pulmonary nodules     Assessment/Plan:       Active Hospital Problems     Diagnosis    Primary Problem: Pulmonary embolism   #Acute pulmonary embolism  Presented with chest pain, no shortness a breath  CT chest showed acute PE in segmental and subsegmental left lower lobe branches, no signs of heart strain  CBC and CMP unremarkable  PESI score is class 2, 84 points, low risk, 1.7-3.5% 30 day mortality  Echo, bilateral lower extremity Dopplers ordered  Heme-Onc team on board  Continue IV heparin  for now     #Renal tumor status post nephrectomy ?  Malignant  Heme-Onc team on board     #brain tumor, status post surgical resection, complicated by CVA, 2015, with residual minimal aphasia  Currently on statin, continue     #Hypothyroidism  On levothyroxine  supplementation, continue     #Epileptic seizure  On phenytoin , continue     #Hypertension  Holding off on losartan  and hydrochlorothiazide   On IV fluids for now     #Diabetes mellitus  On sliding scale, blood glucose appropriately controlled    07/17/23-No change in plan currently. She has finished her echocardiogram, pending reading.      07/18/23-CT brain negative. Heparin  drip Dc'd switched to Eliquis. Pt wants to follow with local oncology once  discharged.     DVT/PE Prophylaxis:  changed to eliquis     Nutrition:   DIET REGULAR Do you want to initiate MNT Protocol? Yes    Energy Calorie Requirements: 1500 kcal/day (30 kcal/kg IBW)  Protein Requirements (gms/day): 60 gm/day (1.2 gm/kg IBW)     Additional clinical characteristics related to nutrition:    - monitor for weight changes   - monitor intake and output    - monitor bowel functions        Lab Results   Component Value Date    ALBUMIN  4.3 07/18/2023         Disposition Planning:   home  The Hospitalist personally evaluated and examined the patient in conjunction with the MLP and agree with the assessments, treatment plan and disposition of the patient as recorded by the [facility].   Hazim Treadway, FNP-BC    [name] HOSPITALIST

## 2023-07-18 NOTE — Nurses Notes (Signed)
 PTT was 63.3. No change to heparin  drip per policy. Next PTT is 06/14 @0530 . Care is ongoing

## 2023-07-19 DIAGNOSIS — Z85528 Personal history of other malignant neoplasm of kidney: Secondary | ICD-10-CM

## 2023-07-19 DIAGNOSIS — I69320 Aphasia following cerebral infarction: Secondary | ICD-10-CM

## 2023-07-19 LAB — CBC WITH DIFF
BASOPHIL #: 0.1 10*3/uL (ref 0.00–0.10)
BASOPHIL %: 1 % (ref 0–1)
EOSINOPHIL #: 0.6 10*3/uL — ABNORMAL HIGH (ref 0.00–0.50)
EOSINOPHIL %: 7 % (ref 1–7)
HCT: 38.4 % (ref 31.2–41.9)
HGB: 13.4 g/dL (ref 10.9–14.3)
LYMPHOCYTE #: 2.7 10*3/uL (ref 1.10–3.10)
LYMPHOCYTE %: 35 % (ref 16–46)
MCH: 32.7 pg (ref 24.7–32.8)
MCHC: 35 g/dL (ref 32.3–35.6)
MCV: 93.6 fL (ref 75.5–95.3)
MONOCYTE #: 0.6 10*3/uL (ref 0.20–0.90)
MONOCYTE %: 8 % (ref 4–11)
MPV: 7.2 fL — ABNORMAL LOW (ref 7.9–10.8)
NEUTROPHIL #: 3.8 10*3/uL (ref 1.90–8.20)
NEUTROPHIL %: 49 % (ref 43–77)
PLATELETS: 229 10*3/uL (ref 140–440)
RBC: 4.11 10*6/uL (ref 3.63–4.92)
RDW: 13.8 % (ref 12.3–17.7)
WBC: 7.7 10*3/uL (ref 3.8–11.8)

## 2023-07-19 LAB — COMPREHENSIVE METABOLIC PANEL, NON-FASTING
ALBUMIN/GLOBULIN RATIO: 1.8 — ABNORMAL HIGH (ref 0.8–1.4)
ALBUMIN: 4.2 g/dL (ref 3.5–5.7)
ALKALINE PHOSPHATASE: 100 U/L (ref 34–104)
ALT (SGPT): 17 U/L (ref 7–52)
ANION GAP: 9 mmol/L (ref 4–13)
AST (SGOT): 13 U/L (ref 13–39)
BILIRUBIN TOTAL: 0.4 mg/dL (ref 0.3–1.0)
BUN/CREA RATIO: 12 (ref 6–22)
BUN: 9 mg/dL (ref 7–25)
CALCIUM, CORRECTED: 8.7 mg/dL — ABNORMAL LOW (ref 8.9–10.8)
CALCIUM: 8.9 mg/dL (ref 8.6–10.3)
CHLORIDE: 104 mmol/L (ref 98–107)
CO2 TOTAL: 27 mmol/L (ref 21–31)
CREATININE: 0.75 mg/dL (ref 0.60–1.30)
ESTIMATED GFR: 95 mL/min/{1.73_m2} (ref >59)
GLOBULIN: 2.3 (ref 2.0–3.5)
GLUCOSE: 110 mg/dL — ABNORMAL HIGH (ref 74–109)
OSMOLALITY, CALCULATED: 279 mosm/kg (ref 270–290)
POTASSIUM: 3.7 mmol/L (ref 3.5–5.1)
PROTEIN TOTAL: 6.5 g/dL (ref 6.4–8.9)
SODIUM: 140 mmol/L (ref 136–145)

## 2023-07-19 LAB — MAGNESIUM: MAGNESIUM: 2.2 mg/dL (ref 1.9–2.7)

## 2023-07-19 LAB — POC BLOOD GLUCOSE (RESULTS)
GLUCOSE, POC: 115 mg/dL — ABNORMAL HIGH (ref 70–100)
GLUCOSE, POC: 124 mg/dL — ABNORMAL HIGH (ref 70–100)

## 2023-07-19 MED ORDER — APIXABAN 5 MG TABLET
5.0000 mg | ORAL_TABLET | Freq: Two times a day (BID) | ORAL | 0 refills | Status: DC
Start: 2023-07-25 — End: 2023-08-03

## 2023-07-19 MED ORDER — APIXABAN 5 MG TABLET
10.0000 mg | ORAL_TABLET | Freq: Two times a day (BID) | ORAL | 0 refills | Status: DC
Start: 2023-07-19 — End: 2023-08-03

## 2023-07-19 NOTE — Discharge Summary (Signed)
 [facility]  DISCHARGE SUMMARY  PATIENT NAME: Elizabeth [name], Elizabeth [name]  MRN: [id number]  DOB: 1970-03-28    ENCOUNTER DATE: 07/16/2023  INPATIENT ADMISSION DATE: 07/16/2023  DISCHARGE DATE:07/19/2023    ATTENDING PHYSICIAN: Taft Fabry, MD  SERVICE: [facility] HOSPITALIST 5  PRIMARY CARE PHYSICIAN: [facility]    No lay caregiver identified.    PRIMARY DISCHARGE DIAGNOSIS: Pulmonary embolism  Active Hospital Problems    Diagnosis Date Noted    Principal Problem: Pulmonary embolism [I26.99] 07/16/2023    Chest pain, unspecified type [R07.9] 07/17/2023    Dyspnea, unspecified type [R06.00] 07/17/2023    History of nephrectomy [Z90.5] 07/17/2023    History of astrocytoma [Z85.841] 07/17/2023    Clear cell renal cell carcinoma, 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] 07/17/2023    Pulmonary nodules [R91.8] 07/17/2023      Resolved Hospital Problems   No resolved problems to display.       Active Non-Hospital Problems    Diagnosis Date Noted    Kidney mass 06/15/2023    Left renal mass 04/25/2023       Nutrition:    DIET REGULAR Do you want to initiate MNT Protocol? Yes    Additional clinical characteristics related to nutrition:    - monitor for weight changes   - monitor intake and output    - monitor bowel functions                    Current Discharge Medication List        PAUSE taking these medications        Details   hydroCHLOROthiazide  25 mg Tablet  Wait to take this until your doctor or other care provider tells you to start again.  Commonly known as: HYDRODIURIL    25 mg, EVERY MORNING  Refills: 0            START taking these medications.        Details   * apixaban 5 mg Tablet  Commonly known as: ELIQUIS   10 mg, Oral, 2 TIMES DAILY  Qty: 22 Tablet  Refills: 0     * apixaban 5 mg Tablet  Commonly known as: ELIQUIS  Start taking on: July 25, 2023   5 mg, Oral, 2 TIMES DAILY  Qty: 60 Tablet  Refills: 0           * This list has 2 medication(s) that are the same as other medications prescribed for you. Read the directions  carefully, and ask your doctor or other care provider to review them with you.                CONTINUE these medications - NO CHANGES were made during your visit.        Details   atorvastatin  20 mg Tablet  Commonly known as: LIPITOR   20 mg, NIGHTLY  Refills: 0     ergocalciferol (vitamin D2) 1,250 mcg (50,000 unit) Capsule  Commonly known as: DRISDOL   Take 1 Capsule (50,000 Units total) by mouth Every Sunday  Refills: 0     escitalopram  oxalate 20 mg Tablet  Commonly known as: LEXAPRO    20 mg, Daily  Refills: 0     famotidine  20 mg Tablet  Commonly known as: PEPCID    20 mg, Daily  Refills: 0     FreeStyle Lancets 28 gauge Misc  Generic drug: lancets   TEST ONCE DAILY  Refills: 0     FreeStyle Lite Meter Kit  Generic  drug: Blood-Glucose Meter   EVERY DAY  Refills: 0     FreeStyle Lite Strips Strip  Generic drug: Blood Sugar Diagnostic   TEST EVERY DAY  Refills: 0     levothyroxine  25 mcg Tablet  Commonly known as: SYNTHROID    TAKE 1 TABLET ONCE A DAY IN THE MORNING 30 MINUTES BEFORE MEALS OR ANY OTHER MEDICATIONS  Refills: 0     losartan  100 mg Tablet  Commonly known as: COZAAR    100 mg, Daily  Refills: 0     magnesium  Oxide 420 mg Tablet   420 mg, Daily  Refills: 0     methocarbamoL  500 mg Tablet  Commonly known as: ROBAXIN    500 mg, 3 TIMES DAILY PRN  Refills: 0     Ozempic  0.25 mg or 0.5 mg(2 mg/1.5 mL) Pen Injector  Generic drug: semaglutide    0.5 mg, EVERY 7 DAYS  Refills: 0     phenytoin  sodium extended release 100 mg Capsule  Commonly known as: DILANTIN    TAKE 3 CAPSULES BY MOUTH TWICE A DAY  Refills: 0            STOP taking these medications.      CHROMIUM CHLORIDE ORAL     CINNAMON BARK EXTRACT ORAL              Discharge medication list refreshed? YES    Allergies[1]    HOSPITAL PROCEDURE(S):  No orders of the defined types were placed in this encounter.      REASON FOR HOSPITALIZATION AND HOSPITAL COURSE  Brief HPI: This is a 53 y.o., female admitted for acute left pulmonary embolism. Patient presented  to the ED with chest pain localized to mid chest. Denied shortness of breath or cough. Reports chest pain continued and worsened prompting her to go to the ED for evaluation.    Brief Hospital Narrative:   [facility] is a 53 y/o female with past medical history brain tumor s/p surgical resection complicated by CVA in 2015 with residual aphasia, T2DM, hypothyroidism, seizure disorder, HTN, and recently diagnosed renal tumor s/p total left nephrectomy. Patient presented with chest pain. CTA chest showed acute pulmonary emboli in segmental and subsegmental left lower lobe branches, no evidence of right heart strain. Bilateral lower extremity duplex negative. Echocardiogram showed EF 65-70%, hyperdynamic left ventricular function, no segmental/regional wall motion abnormalities. She was started on heparin  drip. Heme/onc consulted. Recommend transitioning from heparin  drip to Eliquis 10 mg BID x 7 days then 5 mg BID thereafter. Oxygen saturation remained stable on room air. 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 Recommend patient follow up outpatient with oncology as soon as possible. Patient is medically stable for discharge home with Eliquis. Dr. Margret Shepherd evaluated patient and is in agreement with discharge as documented.    CONDITION ON DISCHARGE:  A. Ambulation: Full ambulation  B. Self-care ability: Complete  C. Cognitive status:Alert and Oriented x 3  D. Code status:Full    PHYSICAL EXAM:  Physical Exam  Constitutional:       General: She is not in acute distress.     Appearance: She is obese.   HENT:      Mouth/Throat:      Mouth: Mucous membranes are moist.      Pharynx: Oropharynx is clear.   Eyes:      Extraocular Movements: Extraocular movements  intact.   Cardiovascular:      Rate and Rhythm: Normal rate and regular rhythm.      Pulses: Normal pulses.      Heart  sounds: Normal heart sounds.   Pulmonary:      Effort: Pulmonary effort is normal.      Breath sounds: Normal breath sounds.   Abdominal:      General: Abdomen is flat.      Palpations: Abdomen is soft.   Musculoskeletal:      Right lower leg: No edema.      Left lower leg: No edema.   Skin:     General: Skin is warm and dry.   Neurological:      General: No focal deficit present.      Mental Status: She is alert.         TRANSITION /POST DISCHARGE CARE/PENDING TESTS/REFERRALS:   - Eliquis 10 mg BID x 7 days total, then 5 mg BID thereafter.   - Follow up with Dr. Latisha Poland, heme/onc, as soon as possible due to underlying renal cell carcinoma.   - Hold HCTZ. Monitor blood pressure, PCP to resume as appropriate.   - Follow up with PCP within 5-7 days.     LINES/DRAINS/WOUNDS AT DISCHARGE  Patient Lines/Drains/Airways Status       Active Line / Dialysis Catheter / Dialysis Graft / Drain / Airway / Wound       Name Placement date Placement time Site Days    Peripheral IV Posterior;Right Dorsal Metacarpals  (top of hand) 07/16/23  1922  -- 2    Wound  Incision Mid Abdomen 06/19/23  --  -- 30                    DISCHARGE DISPOSITION: Home discharge    DISCHARGE INSTRUCTIONS  Post-Discharge Follow Up Appointments       Follow up with Layman Pries, MD    Phone: [phone]    Where: [facility]    Follow up with [facility]    Phone: [phone]    Where: [address], [facility] [address]      Friday Jul 25, 2023    Return Patient Visit with Daneen Dunk, FNP-C at  2:30 PM    Go to Daneen Dunk, FNP-C    Phone: [phone]    Where: [facility]      Urology Oncology, [facility]  [facility]  [address]  [place] [address]  [phone]          No discharge procedures on file.      Follow-up Information       Layman Pries, MD Follow up.    Specialty: HEMATOLOGY-ONCOLOGY  Why: DC home. Eliquis 10 mg BID x 11 doses then  5 mg BID thereafter. Follow up with Dr. Latisha Poland as soon as possible regarding chemotherapy.  Contact information:  [address]  [place] [address]  [phone]               Daneen Dunk, FNP-C. Go on 07/25/2023.    Specialty: FAMILY NURSE PRACTITIONER  Why: Appt at [facility] at 2:15pm.  Contact information:  [address]  [address]  [facility] [address]  [phone]               [facility] Follow up.    Specialty: EXTERNAL  Why: Follow up with PCP within 5-7 days.  Contact information:  [address]  [place] [address]  [phone]                                 Quilla Brunswick, PA-C      Copies sent to Care Team         Relationship Specialty Notifications Start End    [facility] PCP - General EXTERNAL  06/14/21     Phone: [phone] Fax: [phone]         [address] [facility] [address]            Referring providers can utilize [email or web address] to access their referred [facility] patient's information.    This note was partially generated using MModal Fluency Direct system, and there may be some incorrect words, spellings, and punctuation that were not noted in checking the note before saving.       [1]   Allergies  Allergen Reactions    Ace Inhibitors      Other reaction(s): Angioedema

## 2023-07-19 NOTE — Care Plan (Signed)
 Problem: Wound  Goal: Optimal Coping  Outcome: Ongoing (see interventions/notes)  Goal: Optimal Functional Ability  Outcome: Ongoing (see interventions/notes)  Goal: Absence of Infection Signs and Symptoms  Outcome: Ongoing (see interventions/notes)  Goal: Improved Oral Intake  Outcome: Ongoing (see interventions/notes)  Goal: Optimal Pain Control and Function  Outcome: Ongoing (see interventions/notes)  Goal: Skin Health and Integrity  Outcome: Ongoing (see interventions/notes)  Goal: Optimal Wound Healing  Outcome: Ongoing (see interventions/notes)     Problem: Adult Inpatient Plan of Care  Goal: Plan of Care Review  Outcome: Ongoing (see interventions/notes)  Goal: Patient-Specific Goal (Individualized)  Outcome: Ongoing (see interventions/notes)  Goal: Absence of Hospital-Acquired Illness or Injury  Outcome: Ongoing (see interventions/notes)  Goal: Optimal Comfort and Wellbeing  Outcome: Ongoing (see interventions/notes)  Goal: Rounds/Family Conference  Outcome: Ongoing (see interventions/notes)     Problem: Health Knowledge, Opportunity to Enhance (Adult,Obstetrics,Pediatric)  Goal: Knowledgeable about Health Subject/Topic  Description: Patient will demonstrate the desired outcomes by discharge/transition of care.  Outcome: Ongoing (see interventions/notes)     Problem: Fall Injury Risk  Goal: Absence of Fall and Fall-Related Injury  Outcome: Ongoing (see interventions/notes)     Problem: Skin Injury Risk Increased  Goal: Skin Health and Integrity  Outcome: Ongoing (see interventions/notes)     Problem: Comorbidity Management  Goal: Blood Glucose Level Within Target Range  Outcome: Ongoing (see interventions/notes)  Goal: Maintenance of Seizure Control  Outcome: Ongoing (see interventions/notes)

## 2023-07-19 NOTE — Nurses Notes (Signed)
 Pt dc'd home with family. DC instructions given and explained to pt. Pt has verbalized understanding. Pt transported to entrance

## 2023-07-19 NOTE — Nurses Notes (Signed)
 Eliquis coupon for $10 co-pay for one month supply given to pt & explained.

## 2023-07-23 ENCOUNTER — Encounter (HOSPITAL_COMMUNITY): Payer: Self-pay

## 2023-07-23 ENCOUNTER — Ambulatory Visit (HOSPITAL_BASED_OUTPATIENT_CLINIC_OR_DEPARTMENT_OTHER)

## 2023-07-23 ENCOUNTER — Emergency Department
Admission: EM | Admit: 2023-07-23 | Discharge: 2023-07-23 | Disposition: A | Discharge status: Other Acute Inpatient Hospital | Attending: FAMILY PRACTICE | Admitting: FAMILY PRACTICE

## 2023-07-23 ENCOUNTER — Emergency Department (HOSPITAL_BASED_OUTPATIENT_CLINIC_OR_DEPARTMENT_OTHER)

## 2023-07-23 ENCOUNTER — Other Ambulatory Visit: Payer: Self-pay

## 2023-07-23 ENCOUNTER — Encounter (HOSPITAL_BASED_OUTPATIENT_CLINIC_OR_DEPARTMENT_OTHER): Payer: Self-pay

## 2023-07-23 DIAGNOSIS — Z4659 Encounter for fitting and adjustment of other gastrointestinal appliance and device: Secondary | ICD-10-CM

## 2023-07-23 DIAGNOSIS — I2699 Other pulmonary embolism without acute cor pulmonale: Secondary | ICD-10-CM | POA: Insufficient documentation

## 2023-07-23 DIAGNOSIS — J90 Pleural effusion, not elsewhere classified: Secondary | ICD-10-CM | POA: Insufficient documentation

## 2023-07-23 DIAGNOSIS — K449 Diaphragmatic hernia without obstruction or gangrene: Secondary | ICD-10-CM | POA: Insufficient documentation

## 2023-07-23 DIAGNOSIS — J9819 Other pulmonary collapse: Secondary | ICD-10-CM

## 2023-07-23 DIAGNOSIS — R0602 Shortness of breath: Secondary | ICD-10-CM

## 2023-07-23 DIAGNOSIS — C649 Malignant neoplasm of unspecified kidney, except renal pelvis: Secondary | ICD-10-CM | POA: Insufficient documentation

## 2023-07-23 DIAGNOSIS — R0789 Other chest pain: Secondary | ICD-10-CM

## 2023-07-23 DIAGNOSIS — K311 Adult hypertrophic pyloric stenosis: Secondary | ICD-10-CM | POA: Insufficient documentation

## 2023-07-23 DIAGNOSIS — J9811 Atelectasis: Secondary | ICD-10-CM

## 2023-07-23 DIAGNOSIS — R9431 Abnormal electrocardiogram [ECG] [EKG]: Secondary | ICD-10-CM | POA: Insufficient documentation

## 2023-07-23 DIAGNOSIS — C7931 Secondary malignant neoplasm of brain: Secondary | ICD-10-CM | POA: Insufficient documentation

## 2023-07-23 LAB — COMPREHENSIVE METABOLIC PANEL, NON-FASTING
ALBUMIN/GLOBULIN RATIO: 1.2 (ref 0.8–1.4)
ALBUMIN: 3.9 g/dL (ref 3.4–5.0)
ALKALINE PHOSPHATASE: 136 U/L — ABNORMAL HIGH (ref 46–116)
ALT (SGPT): 43 U/L (ref <=78)
ANION GAP: 8 mmol/L (ref 4–13)
AST (SGOT): 21 U/L (ref 15–37)
BILIRUBIN TOTAL: 0.1 mg/dL — ABNORMAL LOW (ref 0.2–1.0)
BUN/CREA RATIO: 15
BUN: 14 mg/dL (ref 7–18)
CALCIUM, CORRECTED: 9.7 mg/dL
CALCIUM: 9.6 mg/dL (ref 8.5–10.1)
CHLORIDE: 104 mmol/L (ref 98–107)
CO2 TOTAL: 28 mmol/L (ref 21–32)
CREATININE: 0.95 mg/dL (ref 0.55–1.02)
ESTIMATED GFR: 71 mL/min/{1.73_m2} (ref >59)
GLOBULIN: 3.2
GLUCOSE: 124 mg/dL — ABNORMAL HIGH (ref 74–106)
OSMOLALITY, CALCULATED: 281 mosm/kg (ref 270–290)
POTASSIUM: 4 mmol/L (ref 3.5–5.1)
PROTEIN TOTAL: 7.1 g/dL (ref 6.4–8.2)
SODIUM: 140 mmol/L (ref 136–145)

## 2023-07-23 LAB — CBC WITH DIFF
BASOPHIL #: 0.02 10*3/uL (ref 0.00–0.10)
BASOPHIL %: 0 % (ref 0–1)
EOSINOPHIL #: 0.55 10*3/uL — ABNORMAL HIGH (ref 0.00–0.50)
EOSINOPHIL %: 6 % (ref 1–7)
HCT: 36.3 % (ref 31.2–41.9)
HGB: 12.8 g/dL (ref 10.9–14.3)
LYMPHOCYTE #: 3.62 10*3/uL — ABNORMAL HIGH (ref 1.10–3.10)
LYMPHOCYTE %: 39 % (ref 16–46)
MCH: 34.2 pg — ABNORMAL HIGH (ref 24.7–32.8)
MCHC: 35.2 g/dL (ref 32.3–35.6)
MCV: 97.2 fL — ABNORMAL HIGH (ref 75.5–95.3)
MONOCYTE #: 0.75 10*3/uL (ref 0.20–0.90)
MONOCYTE %: 8 % (ref 4–11)
MPV: 6.8 fL — ABNORMAL LOW (ref 7.9–10.8)
NEUTROPHIL #: 4.47 10*3/uL (ref 1.90–8.20)
NEUTROPHIL %: 47 % (ref 43–77)
PLATELETS: 240 10*3/uL (ref 140–440)
RBC: 3.73 10*6/uL (ref 3.63–4.92)
RDW: 15.6 % (ref 12.3–17.7)
WBC: 9.4 10*3/uL (ref 3.8–11.8)

## 2023-07-23 LAB — URINALYSIS, MACRO/MICRO
BILIRUBIN: NEGATIVE mg/dL
BLOOD: NEGATIVE mg/dL
GLUCOSE: NEGATIVE mg/dL
KETONES: NEGATIVE mg/dL
LEUKOCYTES: NEGATIVE WBCs/uL
NITRITE: NEGATIVE
PH: 7.5 (ref 4.6–8.0)
PROTEIN: NEGATIVE mg/dL
SPECIFIC GRAVITY: 1.01 (ref 1.003–1.035)
UROBILINOGEN: 0.2 mg/dL (ref 0.2–1.0)

## 2023-07-23 LAB — LACTIC ACID LEVEL W/ REFLEX FOR LEVEL >2.0: LACTIC ACID: 1.5 mmol/L (ref 0.4–2.0)

## 2023-07-23 LAB — NT-PROBNP: NT-PROBNP: 71 pg/mL (ref <=125)

## 2023-07-23 LAB — PT/INR
INR: 1.22 — ABNORMAL HIGH (ref 0.84–1.10)
PROTHROMBIN TIME: 13.8 s — ABNORMAL HIGH (ref 9.8–12.7)

## 2023-07-23 LAB — TROPONIN-I
TROPONIN I: 2 ng/L (ref <15)
TROPONIN I: 2 ng/L (ref <15)
TROPONIN I: 2 ng/L (ref <15)

## 2023-07-23 LAB — PTT (PARTIAL THROMBOPLASTIN TIME): APTT: 34.4 s (ref 25.0–38.0)

## 2023-07-23 LAB — D-DIMER: D-DIMER: 1098 ng{FEU}/mL (ref 215–500)

## 2023-07-23 MED ORDER — LACTATED RINGERS IV BOLUS
1000.0000 mL | INJECTION | Status: AC
Start: 2023-07-23 — End: 2023-07-23
  Administered 2023-07-23: 1000 mL via INTRAVENOUS

## 2023-07-23 MED ORDER — ASPIRIN 81 MG CHEWABLE TABLET
324.0000 mg | CHEWABLE_TABLET | ORAL | Status: AC
Start: 2023-07-23 — End: 2023-07-23
  Administered 2023-07-23: 324 mg via ORAL

## 2023-07-23 MED ORDER — HYDROMORPHONE 2 MG/ML INJECTION WRAPPER
0.5000 mg | INJECTION | INTRAMUSCULAR | Status: DC | PRN
Start: 2023-07-23 — End: 2023-07-23

## 2023-07-23 MED ORDER — HYDROMORPHONE 2 MG/ML INJECTION WRAPPER
0.5000 mg | INJECTION | INTRAMUSCULAR | Status: AC
Start: 2023-07-23 — End: 2023-07-23
  Administered 2023-07-23: 0.5 mg via INTRAVENOUS

## 2023-07-23 MED ORDER — ASPIRIN 81 MG CHEWABLE TABLET
CHEWABLE_TABLET | ORAL | Status: AC
Start: 2023-07-23 — End: 2023-07-23
  Filled 2023-07-23: qty 4

## 2023-07-23 MED ORDER — HYDROMORPHONE 2 MG/ML INJECTION WRAPPER
INJECTION | INTRAMUSCULAR | Status: AC
Start: 2023-07-23 — End: 2023-07-23
  Filled 2023-07-23: qty 1

## 2023-07-23 MED ORDER — ONDANSETRON HCL (PF) 4 MG/2 ML INJECTION SOLUTION
4.0000 mg | Freq: Four times a day (QID) | INTRAMUSCULAR | Status: DC | PRN
Start: 2023-07-23 — End: 2023-07-23

## 2023-07-23 MED ORDER — IOHEXOL 350 MG IODINE/ML INTRAVENOUS SOLUTION
100.0000 mL | INTRAVENOUS | Status: AC
Start: 2023-07-23 — End: 2023-07-23
  Administered 2023-07-23: 100 mL via INTRAVENOUS

## 2023-07-23 NOTE — ED Nurses Note (Signed)
 Patient transferred to hospital bed for comfort

## 2023-07-23 NOTE — ED Nurses Note (Signed)
 Patient up to bedside commode. Voided.

## 2023-07-23 NOTE — ED Nurses Note (Signed)
 NG tube attached to low intermittent suction @ this time per order.

## 2023-07-23 NOTE — ED Nurses Note (Signed)
 Patient informed that there is pain and nausea med ordered if she needs then to just let me know.  Patient verbalized understanding.

## 2023-07-23 NOTE — ED Nurses Note (Signed)
 Patient up to bedside commode. Voided Specimen sent to lab.

## 2023-07-23 NOTE — ED Attending Handoff Note (Signed)
 MDM:  This 53 year old female patient presents emergency department overnight with chest pain, and pressure stating that chest pain was going on for about a week, worse over the last few hours before presenting and in his left-sided nonradiating.  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  Her CT of the abdomen pelvis does show large left diaphragmatic hernia that contains the fundus and proximal body of the stomach with air-fluid levels increased from prior CT with concern for gastric obstruction.  She has a left lower lobe pulmonary atelectasis, and an enlarging left pleural effusion and a question of a pulmonary infarct noted as well.  She currently does have an NG tube in for gastric decompression, your CBC has a white count at 9.4, H&H of 12.8 and 36 with 240,000 platelets.  Her CMP has a glucose of 124 otherwise mildly elevated alkaline phosphatase at 136, LFTs are normal as is her calcium , and protein.  Her exam today shows that the NG tube is in the left nares, gastric contents noted in the tube, tube placement was appropriate with her intermittent suction auscultated through the abdomen.  Her lungs are dull in the left base, with crackles right lung house some crackles but better aeration, the mid and upper lung fields have an occasional scattered rhonchi but good aeration.  Your heart has a regular rate and rhythm without murmur, abdomen does have hypoactive bowel sounds, can hear the intermittent suction of the NG tube.  She can move her extremities symmetrically, neurologically she grossly appears to be intact.    Impression: Diaphragmatic hernia with gastric contents in the left lower chest and gastric outlet obstruction.  History of renal cell carcinoma  with metastatic disease in the brain, and what appears to be metastatic nodules in both lungs currently.  History of seizures, recent diagnosis of pulmonary embolus on anticoagulation.      Plan:  Continue NG tube, monitoring laboratory data, IV hydration, place a electrolytes treat pain and nausea as needed.  Awaiting bed at [facility], patient accepted there for further treatment, intervention and care.    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     Medications Ordered/Administered in the ED   LR bolus infusion 1,000 mL (1,000 mL Intravenous New Bag/New Syringe 07/23/23 0824)   HYDROmorphone  (DILAUDID ) 2 mg/mL injection (has no administration in time range)   ondansetron  (ZOFRAN ) 2 mg/mL injection (has no administration in time range)   aspirin chewable tablet 324 mg (324 mg Oral Given 07/23/23 0350)   iohexol  (OMNIPAQUE  350) infusion (100 mL Intravenous Given 07/23/23 0458)   HYDROmorphone  (DILAUDID ) 2 mg/mL injection (0.5 mg Intravenous Given 07/23/23 9563)        Current Discharge Medication List        PAUSE taking these medications        Details   hydroCHLOROthiazide  25 mg Tablet  Wait to take this until your  doctor or other care provider tells you to start again.  Commonly known as: HYDRODIURIL    25 mg, EVERY MORNING  Refills: 0            CONTINUE these medications - NO CHANGES were made during your visit.        Details   * apixaban 5 mg Tablet  Commonly known as: ELIQUIS   10 mg, Oral, 2 TIMES DAILY  Qty: 22 Tablet  Refills: 0     * apixaban 5 mg Tablet  Commonly known as: ELIQUIS  Start taking on: July 25, 2023   5 mg,  Oral, 2 TIMES DAILY  Qty: 60 Tablet  Refills: 0     atorvastatin  20 mg Tablet  Commonly known as: LIPITOR   20 mg, NIGHTLY  Refills: 0     ergocalciferol (vitamin D2) 1,250 mcg (50,000 unit) Capsule  Commonly known as: DRISDOL   Take 1 Capsule (50,000 Units total) by mouth Every Sunday  Refills: 0     escitalopram  oxalate 20 mg Tablet  Commonly known as: LEXAPRO    20 mg, Daily  Refills: 0     famotidine  20 mg Tablet  Commonly known as: PEPCID    20 mg, Daily  Refills: 0     FreeStyle Lancets 28 gauge Misc  Generic drug: lancets   TEST ONCE DAILY  Refills: 0     FreeStyle Lite Meter Kit  Generic drug: Blood-Glucose Meter   EVERY DAY  Refills: 0     FreeStyle Lite Strips Strip  Generic drug: Blood Sugar Diagnostic   TEST EVERY DAY  Refills: 0     levothyroxine  25 mcg Tablet  Commonly known as: SYNTHROID    TAKE 1 TABLET ONCE A DAY IN THE MORNING 30 MINUTES BEFORE MEALS OR ANY OTHER MEDICATIONS  Refills: 0     losartan  100 mg Tablet  Commonly known as: COZAAR    100 mg, Daily  Refills: 0     magnesium  Oxide 420 mg Tablet   420 mg, Daily  Refills: 0     methocarbamoL  500 mg Tablet  Commonly known as: ROBAXIN    500 mg, 3 TIMES DAILY PRN  Refills: 0     Ozempic  0.25 mg or 0.5 mg(2 mg/1.5 mL) Pen Injector  Generic drug: semaglutide    0.5 mg, EVERY 7 DAYS  Refills: 0     phenytoin  sodium extended release 100 mg Capsule  Commonly known as: DILANTIN    TAKE 3 CAPSULES BY MOUTH TWICE A DAY  Refills: 0           * This list has 2 medication(s) that are the same as other medications prescribed for you. Read the directions carefully, and ask your doctor or other care provider to review them with you.

## 2023-07-23 NOTE — ED Nurses Note (Signed)
 Report called to [facility] @ [facility].

## 2023-07-23 NOTE — ED Provider Notes (Addendum)
 [facility]  [facility] emergency department         HISTORY OF PRESENT ILLNESS     Date:  07/23/2023  Patient's Name:  Elizabeth [name]  Date of Birth:  1971-01-05    Patient is a 53 year old presenting with chest pain for the past week worse this a.m. about 2 hours prior to coming to the ER.  Pain is on the left side of the chest nonradiating.  Patient has a history of brain tumor resected kidney tumor resected.  Patient found to have pulmonary embolus one-week ago at [facility] was placed on Eliquis.  Patient denies shortness of breath.  No nausea no vomiting.        Review of Systems     Review of Systems   Constitutional: Negative.    HENT: Negative.     Eyes: Negative.    Respiratory:  Positive for chest tightness and shortness of breath.    Cardiovascular:  Positive for chest pain.   Gastrointestinal: Negative.    Musculoskeletal: Negative.    Neurological: Negative.    Hematological: Negative.    Psychiatric/Behavioral: Negative.     All other systems reviewed and are negative.      Previous History     Past Medical History:  Past Medical History:   Diagnosis Date    Anemia     Cancer (CMS HCC)     brain tumor 8/15 with chemo and radiation    Chest pain, unspecified type 07/17/2023    CPAP (continuous positive airway pressure) dependence     CVA (cerebrovascular accident)     mini during brain surgery per pt    Diabetes mellitus, type 2     Disorder of thyroid     Dyspnea, unspecified type 07/17/2023    Epileptic seizure (CMS HCC)     Essential hypertension     History of kidney surgery 06/2023    Pulmonary embolism 07/16/2023    Sleep apnea        Past Surgical History:  Past Surgical History:   Procedure Laterality Date    Brain surgery      Hx cholecystectomy         Social History:  Social History[1]  Social History     Substance and Sexual Activity   Drug Use Never       Family History:  Family History   Problem Relation Age of Onset    Hypertension (High Blood  Pressure) Mother     Arthritis-osteo Mother     Heart Disease Father     No Known Problems Sister     No Known Problems Brother     No Known Problems Maternal Grandmother     No Known Problems Maternal Grandfather     Breast Cancer Paternal Grandmother     No Known Problems Paternal Grandfather     No Known Problems Daughter     No Known Problems Son     No Known Problems Maternal Aunt     No Known Problems Maternal Uncle     No Known Problems Paternal Aunt     No Known Problems Paternal Uncle     No Known Problems Other        Medication History:  Current Outpatient Medications   Medication Sig    apixaban (ELIQUIS) 5 mg Oral Tablet Take 2 Tablets (10 mg total) by mouth Twice daily for 11 doses    [START ON 07/25/2023] apixaban (ELIQUIS) 5 mg  Oral Tablet Take 1 Tablet (5 mg total) by mouth Twice daily for 30 days    atorvastatin  (LIPITOR) 20 mg Oral Tablet Take 1 Tablet (20 mg total) by mouth Every night    ergocalciferol, vitamin D2, (DRISDOL) 1,250 mcg (50,000 unit) Oral Capsule Take 1 Capsule (50,000 Units total) by mouth Every Sunday    escitalopram  oxalate (LEXAPRO ) 20 mg Oral Tablet Take 1 Tablet (20 mg total) by mouth Daily    famotidine  (PEPCID ) 20 mg Oral Tablet Take 1 Tablet (20 mg total) by mouth Daily    FREESTYLE LANCETS 28 gauge Misc TEST ONCE DAILY    FREESTYLE LITE METER Kit EVERY DAY    FREESTYLE LITE STRIPS Strip TEST EVERY DAY    [Paused] hydroCHLOROthiazide  (HYDRODIURIL ) 25 mg Oral Tablet Take 1 Tablet (25 mg total) by mouth Every morning    levothyroxine  (SYNTHROID ) 25 mcg Oral Tablet TAKE 1 TABLET ONCE A DAY IN THE MORNING 30 MINUTES BEFORE MEALS OR ANY OTHER MEDICATIONS    losartan  (COZAAR ) 100 mg Oral Tablet Take 1 Tablet (100 mg total) by mouth Daily    magnesium  Oxide 420 mg Oral Tablet Take 1 Tablet (420 mg total) by mouth Daily    methocarbamoL  (ROBAXIN ) 500 mg Oral Tablet Take 1 Tablet (500 mg total) by mouth Three times a day as needed    phenytoin  sodium extended release (DILANTIN ) 100  mg Oral Capsule TAKE 3 CAPSULES BY MOUTH TWICE A DAY    semaglutide  (OZEMPIC ) 0.25 mg or 0.5 mg(2 mg/1.5 mL) Subcutaneous Pen Injector Inject 0.5 mg under the skin Every 7 days       Allergies:  Allergies[2]    Physical Exam     Vitals:    BP (!) 150/84   Pulse 78   Temp 36.8 C (98.3 F)   Resp (!) 23   Ht 1.575 m (5' 2)   Wt 85.3 kg (188 lb)   SpO2 93%   BMI 34.39 kg/m           Physical Exam  Vitals and nursing note reviewed. Exam conducted with a chaperone present.   Constitutional:       General: She is not in acute distress.     Appearance: Normal appearance. She is well-developed.   HENT:      Head: Normocephalic and atraumatic.      Right Ear: Tympanic membrane and ear canal normal.      Left Ear: Tympanic membrane and ear canal normal.      Nose: Nose normal.      Mouth/Throat:      Mouth: Mucous membranes are moist.   Eyes:      Extraocular Movements: Extraocular movements intact.      Conjunctiva/sclera: Conjunctivae normal.      Pupils: Pupils are equal, round, and reactive to light.   Cardiovascular:      Rate and Rhythm: Normal rate and regular rhythm.      Pulses: Normal pulses.      Heart sounds: No murmur heard.  Pulmonary:      Effort: Pulmonary effort is normal. No respiratory distress.      Breath sounds: Normal breath sounds.   Abdominal:      General: Abdomen is flat. Bowel sounds are normal.      Palpations: Abdomen is soft.      Tenderness: There is no abdominal tenderness.   Musculoskeletal:         General: No swelling. Normal range of motion.  Cervical back: Neck supple.   Skin:     General: Skin is warm and dry.      Capillary Refill: Capillary refill takes less than 2 seconds.   Neurological:      General: No focal deficit present.      Mental Status: She is alert and oriented to person, place, and time.   Psychiatric:         Mood and Affect: Mood normal.         Behavior: Behavior normal.         Diagnostic Studies/Treatment     Medications:  Medications  Ordered/Administered in the ED   HYDROmorphone  (DILAUDID ) 2 mg/mL injection (has no administration in time range)   ondansetron  (ZOFRAN ) 2 mg/mL injection (has no administration in time range)   aspirin chewable tablet 324 mg (324 mg Oral Given 07/23/23 0350)   iohexol  (OMNIPAQUE  350) infusion (100 mL Intravenous Given 07/23/23 0458)   HYDROmorphone  (DILAUDID ) 2 mg/mL injection (0.5 mg Intravenous Given 07/23/23 0638)   LR bolus infusion 1,000 mL (1,000 mL Intravenous New Bag/New Syringe 07/23/23 0824)       New Prescriptions    No medications on file       Labs:    Results for orders placed or performed during the hospital encounter of 07/23/23 (from the past 12 hours)   URINALYSIS, MACRO/MICRO   Result Value Ref Range    COLOR Light Yellow Light Yellow, Yellow    APPEARANCE Clear Clear    SPECIFIC GRAVITY 1.010 1.003 - 1.035    PH 7.5 4.6 - 8.0    LEUKOCYTES Negative Negative WBCs/uL    NITRITE Negative Negative    PROTEIN Negative Negative mg/dL    GLUCOSE Negative Negative mg/dL    KETONES Negative Negative mg/dL    BILIRUBIN Negative Negative mg/dL    BLOOD Negative Negative mg/dL    UROBILINOGEN 0.2 0.2 - 1.0 mg/dL        Radiology:  XR CHEST AP  CT ANGIO CHEST FOR PULMONARY EMBOLUS W IV CONTRAST  XR AP MOBILE CHEST    XR AP MOBILE CHEST   Final Result   NG tube in the stomach            Radiologist location ID: [id number]         CT ANGIO CHEST FOR PULMONARY EMBOLUS W IV CONTRAST   Final Result   1. Redemonstration of acute pulmonary emboli involving the left lower lobe segmental and subsegmental pulmonary arteries, similar to recent prior. No evidence of right heart strain.   2. Large left posterior diaphragmatic hernia containing the fundus and proximal body of the stomach which is distended with air-fluid level, increased from prior and raising concern for gastric obstruction. Surgical consultation is advised.   3. Increased atelectasis in the left lower lobe with near complete collapse likely secondary to a  combination of the diaphragmatic hernia and enlarging left pleural effusion. Can not exclude underlying acute infiltrate or pulmonary infarct.   4. Small left pleural effusion has increased from prior.   5. Unchanged pulmonary nodules. Pulmonary metastasis is a possibility. Continued follow-up is recommended.      These important findings were relayed via secure EPIC chat to Dr. Hassie Lint on 07/23/2023 at 5:16 AM.               Radiologist location ID: [id number]         XR CHEST AP   Final Result   Elevated left hemidiaphragm with  gaseous distention of the stomach secondary to a large left posterior diaphragmatic hernia present on recent chest CT. This is new from prior PET/CT in May 2025.      Mild left basilar opacities favor atelectasis.                  Radiologist location ID: [id number]             ECG:   Normal sinus rhythm 80 beats per minute PR interval 170 nonspecific ST-T changes            Differential diagnosis  1. Atypical chest pain 2. Congestive heart failure 3. Pulmonary embolus 4.  Pneumonia   Course/Disposition/Plan     Course:     ED Course as of 07/23/23 2000   Wed Jul 23, 2023   1610 Patient care discussed with Dr. Particia Bolus general surgeon who recommends patient be transferred to [facility] given her complicated history   0559 Consultation called to [facility] for transfer   [phone] Consultation called to [facility] for transfer awaiting call back   0806 Patient care discussed with [facility] transfer center.  Patient accepted by Dr. Rheba Cedar .  He recommended placement of nasogastric tube to low intermittent suction   Patient will have serial troponins chest x-ray rule out myocardial infarction    Disposition:    Transfered to Another Facility    Condition at Disposition:   Stable  Follow up:   No follow-up provider specified.    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

## 2023-07-23 NOTE — ED Nurses Note (Signed)
 Pt to be transferred to [facility] by [facility]. Pt transported out by stretcher at this time.

## 2023-07-24 ENCOUNTER — Inpatient Hospital Stay (HOSPITAL_COMMUNITY)

## 2023-07-24 ENCOUNTER — Inpatient Hospital Stay
Admission: AD | Admit: 2023-07-24 | Discharge: 2023-08-03 | DRG: 327 | Disposition: A | Discharge status: Rehabilitation Facility | Source: Other Acute Inpatient Hospital | Attending: THORACIC SURGERY CARDIOTHORACIC VASCULAR SURGERY | Admitting: THORACIC SURGERY CARDIOTHORACIC VASCULAR SURGERY

## 2023-07-24 DIAGNOSIS — E785 Hyperlipidemia, unspecified: Secondary | ICD-10-CM | POA: Diagnosis present

## 2023-07-24 DIAGNOSIS — E119 Type 2 diabetes mellitus without complications: Secondary | ICD-10-CM | POA: Diagnosis present

## 2023-07-24 DIAGNOSIS — K317 Polyp of stomach and duodenum: Secondary | ICD-10-CM | POA: Diagnosis present

## 2023-07-24 DIAGNOSIS — Z7901 Long term (current) use of anticoagulants: Secondary | ICD-10-CM

## 2023-07-24 DIAGNOSIS — F39 Unspecified mood [affective] disorder: Secondary | ICD-10-CM | POA: Diagnosis present

## 2023-07-24 DIAGNOSIS — K219 Gastro-esophageal reflux disease without esophagitis: Secondary | ICD-10-CM | POA: Diagnosis present

## 2023-07-24 DIAGNOSIS — Z4659 Encounter for fitting and adjustment of other gastrointestinal appliance and device: Secondary | ICD-10-CM

## 2023-07-24 DIAGNOSIS — C642 Malignant neoplasm of left kidney, except renal pelvis: Secondary | ICD-10-CM | POA: Diagnosis present

## 2023-07-24 DIAGNOSIS — Z79899 Other long term (current) drug therapy: Secondary | ICD-10-CM

## 2023-07-24 DIAGNOSIS — G40909 Epilepsy, unspecified, not intractable, without status epilepticus: Secondary | ICD-10-CM | POA: Diagnosis present

## 2023-07-24 DIAGNOSIS — I10 Essential (primary) hypertension: Secondary | ICD-10-CM | POA: Diagnosis present

## 2023-07-24 DIAGNOSIS — Z7985 Long-term (current) use of injectable non-insulin antidiabetic drugs: Secondary | ICD-10-CM

## 2023-07-24 DIAGNOSIS — Z888 Allergy status to other drugs, medicaments and biological substances status: Secondary | ICD-10-CM

## 2023-07-24 DIAGNOSIS — N390 Urinary tract infection, site not specified: Secondary | ICD-10-CM | POA: Diagnosis not present

## 2023-07-24 DIAGNOSIS — R9431 Abnormal electrocardiogram [ECG] [EKG]: Secondary | ICD-10-CM

## 2023-07-24 DIAGNOSIS — Z794 Long term (current) use of insulin: Secondary | ICD-10-CM

## 2023-07-24 DIAGNOSIS — E861 Hypovolemia: Secondary | ICD-10-CM | POA: Diagnosis present

## 2023-07-24 DIAGNOSIS — I499 Cardiac arrhythmia, unspecified: Secondary | ICD-10-CM

## 2023-07-24 DIAGNOSIS — K660 Peritoneal adhesions (postprocedural) (postinfection): Secondary | ICD-10-CM | POA: Diagnosis present

## 2023-07-24 DIAGNOSIS — E039 Hypothyroidism, unspecified: Secondary | ICD-10-CM | POA: Diagnosis present

## 2023-07-24 DIAGNOSIS — Z85528 Personal history of other malignant neoplasm of kidney: Secondary | ICD-10-CM

## 2023-07-24 DIAGNOSIS — I69320 Aphasia following cerebral infarction: Secondary | ICD-10-CM

## 2023-07-24 DIAGNOSIS — I2699 Other pulmonary embolism without acute cor pulmonale: Secondary | ICD-10-CM | POA: Diagnosis present

## 2023-07-24 DIAGNOSIS — R079 Chest pain, unspecified: Secondary | ICD-10-CM

## 2023-07-24 DIAGNOSIS — G4733 Obstructive sleep apnea (adult) (pediatric): Secondary | ICD-10-CM | POA: Diagnosis present

## 2023-07-24 DIAGNOSIS — Z85841 Personal history of malignant neoplasm of brain: Secondary | ICD-10-CM

## 2023-07-24 DIAGNOSIS — C649 Malignant neoplasm of unspecified kidney, except renal pelvis: Secondary | ICD-10-CM

## 2023-07-24 DIAGNOSIS — K2210 Ulcer of esophagus without bleeding: Secondary | ICD-10-CM | POA: Diagnosis present

## 2023-07-24 DIAGNOSIS — Z905 Acquired absence of kidney: Secondary | ICD-10-CM | POA: Diagnosis present

## 2023-07-24 DIAGNOSIS — Z86711 Personal history of pulmonary embolism: Secondary | ICD-10-CM

## 2023-07-24 DIAGNOSIS — K449 Diaphragmatic hernia without obstruction or gangrene: Principal | ICD-10-CM | POA: Diagnosis present

## 2023-07-24 DIAGNOSIS — Z9221 Personal history of antineoplastic chemotherapy: Secondary | ICD-10-CM

## 2023-07-24 DIAGNOSIS — Z79890 Hormone replacement therapy (postmenopausal): Secondary | ICD-10-CM

## 2023-07-24 DIAGNOSIS — Z4682 Encounter for fitting and adjustment of non-vascular catheter: Secondary | ICD-10-CM

## 2023-07-24 DIAGNOSIS — Z923 Personal history of irradiation: Secondary | ICD-10-CM

## 2023-07-24 DIAGNOSIS — I639 Cerebral infarction, unspecified: Secondary | ICD-10-CM

## 2023-07-24 DIAGNOSIS — C719 Malignant neoplasm of brain, unspecified: Secondary | ICD-10-CM

## 2023-07-24 DIAGNOSIS — R410 Disorientation, unspecified: Secondary | ICD-10-CM

## 2023-07-24 DIAGNOSIS — B9620 Unspecified Escherichia coli [E. coli] as the cause of diseases classified elsewhere: Secondary | ICD-10-CM | POA: Diagnosis not present

## 2023-07-24 DIAGNOSIS — Z5982 Transportation insecurity: Secondary | ICD-10-CM

## 2023-07-24 DIAGNOSIS — K440 Diaphragmatic hernia with obstruction, without gangrene: Principal | ICD-10-CM | POA: Diagnosis present

## 2023-07-24 LAB — CBC WITH DIFF
BASOPHIL #: <0.1 10*3/uL (ref <=0.20)
BASOPHIL %: 0.5 %
EOSINOPHIL #: 0.47 10*3/uL (ref <=0.50)
EOSINOPHIL %: 4.9 %
HCT: 37.4 % (ref 34.8–46.0)
HGB: 13.4 g/dL (ref 11.5–16.0)
IMMATURE GRANULOCYTE #: <0.1 10*3/uL (ref <0.10)
IMMATURE GRANULOCYTE %: 0.3 % (ref 0.0–1.0)
LYMPHOCYTE #: 2.55 10*3/uL (ref 1.00–4.80)
LYMPHOCYTE %: 26.6 %
MCH: 33.4 pg — ABNORMAL HIGH (ref 26.0–32.0)
MCHC: 35.8 g/dL — ABNORMAL HIGH (ref 31.0–35.5)
MCV: 93.3 fL (ref 78.0–100.0)
MONOCYTE #: 0.74 10*3/uL (ref 0.20–1.10)
MONOCYTE %: 7.7 %
MPV: 8.9 fL (ref 8.7–12.5)
NEUTROPHIL #: 5.76 10*3/uL (ref 1.50–7.70)
NEUTROPHIL %: 60 %
PLATELETS: 237 10*3/uL (ref 150–400)
RBC: 4.01 10*6/uL (ref 3.85–5.22)
RDW-CV: 13.4 % (ref 11.5–15.5)
WBC: 9.6 10*3/uL (ref 3.7–11.0)

## 2023-07-24 LAB — PTT (PARTIAL THROMBOPLASTIN TIME): APTT: 29.4 s (ref 24.3–37.6)

## 2023-07-24 LAB — POC BLOOD GLUCOSE (RESULTS)
GLUCOSE, POC: 118 mg/dL (ref 65–125)
GLUCOSE, POC: 94 mg/dL (ref 65–125)
GLUCOSE, POC: 93 mg/dL (ref 65–125)
GLUCOSE, POC: 107 mg/dL (ref 65–125)

## 2023-07-24 LAB — BASIC METABOLIC PANEL
ANION GAP: 13 mmol/L (ref 4–13)
BUN/CREA RATIO: 12 (ref 6–22)
BUN: 9 mg/dL (ref 8–25)
CALCIUM: 9.2 mg/dL (ref 8.6–10.2)
CHLORIDE: 102 mmol/L (ref 96–111)
CO2 TOTAL: 25 mmol/L (ref 22–30)
CREATININE: 0.76 mg/dL (ref 0.60–1.05)
ESTIMATED GFR - FEMALE: >90 mL/min/BSA (ref >=60)
GLUCOSE: 100 mg/dL (ref 65–125)
POTASSIUM: 3.9 mmol/L (ref 3.5–5.1)
SODIUM: 140 mmol/L (ref 136–145)

## 2023-07-24 LAB — PHOSPHORUS: PHOSPHORUS: 4.6 mg/dL (ref 2.4–4.7)

## 2023-07-24 LAB — ECG 12 LEAD
Atrial Rate: 80 {beats}/min
Calculated P Axis: 55 degrees
Calculated R Axis: 90 degrees
Calculated T Axis: 77 degrees
PR Interval: 170 ms
QRS Duration: 74 ms
QT Interval: 368 ms
QTC Calculation: 424 ms
Ventricular rate: 80 {beats}/min

## 2023-07-24 LAB — LACTIC ACID LEVEL W/ REFLEX FOR LEVEL >2.0: LACTIC ACID: 0.9 mmol/L (ref 0.5–2.2)

## 2023-07-24 LAB — TROPONIN-I: TROPONIN-I HS: <2.7 ng/L (ref <=14.0)

## 2023-07-24 LAB — PT/INR
INR: 1.11 (ref 0.80–1.20)
PROTHROMBIN TIME: 12.9 s (ref 9.2–14.0)

## 2023-07-24 LAB — MAGNESIUM: MAGNESIUM: 2 mg/dL (ref 1.8–2.6)

## 2023-07-24 MED ORDER — BENZONATATE 100 MG CAPSULE
100.0000 mg | ORAL_CAPSULE | Freq: Three times a day (TID) | ORAL | Status: DC | PRN
Start: 2023-07-24 — End: 2023-07-26
  Administered 2023-07-24: 100 mg via ORAL
  Filled 2023-07-24: qty 1

## 2023-07-24 MED ORDER — GLUCAGON HCL 1 MG/ML SOLUTION FOR INJECTION
1.0000 mg | Freq: Once | INTRAMUSCULAR | Status: DC | PRN
Start: 2023-07-24 — End: 2023-08-03

## 2023-07-24 MED ORDER — ACETAMINOPHEN 1,000 MG/100 ML (10 MG/ML) INTRAVENOUS SOLUTION
1000.0000 mg | Freq: Four times a day (QID) | INTRAVENOUS | Status: AC | PRN
Start: 2023-07-24 — End: 2023-07-25

## 2023-07-24 MED ORDER — DEXTROSE 40 % ORAL GEL
15.0000 g | ORAL | Status: DC | PRN
Start: 2023-07-24 — End: 2023-08-03

## 2023-07-24 MED ORDER — FOSPHENYTOIN 100 MG PE/2 ML INJECTION SOLUTION
200.0000 mg | Freq: Three times a day (TID) | INTRAMUSCULAR | Status: DC
Start: 2023-07-24 — End: 2023-07-24
  Filled 2023-07-24: qty 4

## 2023-07-24 MED ORDER — INSULIN LISPRO 100 UNIT/ML SUB-Q SSIP VIAL
2.0000 [IU] | INJECTION | Freq: Four times a day (QID) | SUBCUTANEOUS | Status: DC
Start: 2023-07-24 — End: 2023-08-03
  Administered 2023-07-24 – 2023-07-28 (×20): 0 [IU] via SUBCUTANEOUS
  Administered 2023-07-29 (×2): 2 [IU] via SUBCUTANEOUS
  Administered 2023-07-29 – 2023-07-30 (×3): 0 [IU] via SUBCUTANEOUS
  Administered 2023-07-30: 2 [IU] via SUBCUTANEOUS
  Administered 2023-07-30 – 2023-07-31 (×5): 0 [IU] via SUBCUTANEOUS
  Administered 2023-07-31: 2 [IU] via SUBCUTANEOUS
  Administered 2023-08-01 – 2023-08-03 (×11): 0 [IU] via SUBCUTANEOUS
  Filled 2023-07-24 (×4): qty 20

## 2023-07-24 MED ORDER — SODIUM CHLORIDE 0.9% FLUSH BAG - 250 ML
INTRAVENOUS | Status: DC | PRN
Start: 2023-07-24 — End: 2023-08-03

## 2023-07-24 MED ORDER — SODIUM CHLORIDE 0.9 % (FLUSH) INJECTION SYRINGE
2.0000 mL | INJECTION | Freq: Three times a day (TID) | INTRAMUSCULAR | Status: DC
Start: 2023-07-24 — End: 2023-08-03
  Administered 2023-07-24 (×2): 6 mL
  Administered 2023-07-24: 0 mL
  Administered 2023-07-25: 6 mL
  Administered 2023-07-25: 0 mL
  Administered 2023-07-25: 6 mL
  Administered 2023-07-26: 0 mL
  Administered 2023-07-26: 6 mL
  Administered 2023-07-26: 5 mL
  Administered 2023-07-27: 6 mL
  Administered 2023-07-27: 0 mL
  Administered 2023-07-27: 5 mL
  Administered 2023-07-28: 0 mL
  Administered 2023-07-28 (×2): 2 mL
  Administered 2023-07-29 (×2): 0 mL
  Administered 2023-07-29: 2 mL
  Administered 2023-07-30: 6 mL
  Administered 2023-07-30 – 2023-08-01 (×7): 0 mL
  Administered 2023-08-01: 5 mL
  Administered 2023-08-02: 0 mL
  Administered 2023-08-02: 5 mL
  Administered 2023-08-02 – 2023-08-03 (×4): 0 mL

## 2023-07-24 MED ORDER — SODIUM CHLORIDE 0.9 % (FLUSH) INJECTION SYRINGE
2.0000 mL | INJECTION | INTRAMUSCULAR | Status: DC | PRN
Start: 2023-07-24 — End: 2023-08-03
  Administered 2023-07-25: 3 mL

## 2023-07-24 MED ORDER — DEXTROSE 5% IN WATER (D5W) FLUSH BAG - 250 ML
INTRAVENOUS | Status: DC | PRN
Start: 2023-07-24 — End: 2023-08-03

## 2023-07-24 MED ORDER — SODIUM CHLORIDE 0.9 % INTRAVENOUS SOLUTION
200.0000 mg | Freq: Three times a day (TID) | INTRAVENOUS | Status: AC
Start: 2023-07-24 — End: ?
  Administered 2023-07-24 (×2): 0 mg via INTRAVENOUS
  Administered 2023-07-24 (×2): 200 mg via INTRAVENOUS
  Administered 2023-07-24: 0 mg via INTRAVENOUS
  Administered 2023-07-24: 200 mg via INTRAVENOUS
  Administered 2023-07-25 (×2): 0 mg via INTRAVENOUS
  Administered 2023-07-25: 200 mg via INTRAVENOUS
  Administered 2023-07-25: 0 mg via INTRAVENOUS
  Administered 2023-07-25 – 2023-07-26 (×2): 200 mg via INTRAVENOUS
  Administered 2023-07-26 (×2): 0 mg via INTRAVENOUS
  Administered 2023-07-26 (×2): 200 mg via INTRAVENOUS
  Administered 2023-07-26 – 2023-07-27 (×4): 0 mg via INTRAVENOUS
  Administered 2023-07-27 (×3): 200 mg via INTRAVENOUS
  Administered 2023-07-28: 0 mg via INTRAVENOUS
  Administered 2023-07-28: 200 mg via INTRAVENOUS
  Filled 2023-07-24 (×14): qty 4

## 2023-07-24 MED ORDER — DEXTROSE 5 % AND 0.45 % SODIUM CHLORIDE INTRAVENOUS SOLUTION
INTRAVENOUS | Status: DC
Start: 2023-07-24 — End: 2023-07-26
  Administered 2023-07-26: 0 mL via INTRAVENOUS

## 2023-07-24 MED ORDER — DEXTROSE 50 % IN WATER (D50W) INTRAVENOUS SYRINGE
12.5000 g | INJECTION | INTRAVENOUS | Status: DC | PRN
Start: 2023-07-24 — End: 2023-08-03

## 2023-07-24 MED ORDER — SODIUM CHLORIDE 0.9 % INTRAVENOUS SOLUTION
INTRAVENOUS | Status: DC
Start: 2023-07-24 — End: 2023-07-24
  Administered 2023-07-24: 0 mL via INTRAVENOUS

## 2023-07-24 NOTE — Progress Notes (Signed)
 [facility]  Thoracic Surgery   Progress Note      Demica, Zook  Date of Admission:  07/24/2023  Date of Birth:  [birth year]    Hospital Day:  LOS: 0 days   Date of Service:  07/24/2023    Subjective   NAEO. NGT in place functioning well. Chest pain improved.    Current Diet:    DIET NPO - NOW EXCEPT ALL MEDS WITH SIPS OF WATER       Current Medications:  benzonatate (TESSALON) capsule, 100 mg, Oral, Q8H PRN  Correction/SSIP insulin  lispro 100 units/mL injection, 2-9 Units, Subcutaneous, 4x/day AC  D5W 250 mL flush bag, , Intravenous, Q15 Min PRN  dextrose  (GLUTOSE) 40% oral gel, 15 g, Oral, Q15 Min PRN  dextrose  50% (0.5 g/mL) injection - syringe, 12.5 g, Intravenous, Q15 Min PRN  fosphenytoin (CEREBYX) 200 mg in NS 50 mL IVPB, 200 mg, Intravenous, Q8H  glucagon  injection 1 mg, 1 mg, IntraMUSCULAR, Once PRN  NS 250 mL flush bag, , Intravenous, Q15 Min PRN  NS flush syringe, 2-6 mL, Intracatheter, Q8HRS  NS flush syringe, 2-6 mL, Intracatheter, Q1 MIN PRN  NS premix infusion, , Intravenous, Continuous            Vital Signs:  Temp (24hrs) Max:36.9 C (98.4 F)        Temperature: 36.9 C (98.4 F)  BP (Non-Invasive): 133/67  MAP (Non-Invasive): 85 mmHG  Heart Rate: 75  Respiratory Rate: 20  SpO2: 93 %    Base (Admission) Weight:  Weight: 83.2 kg (183 lb 6.8 oz)  Weight:  Weight: 83.2 kg (183 lb 6.8 oz)      Physical Exam:    General: no acute distress, appears stated age  Head: normocephalic, atraumatic  Chest: clear, normal excursion   Heart: RRR  Abdomen: soft, non-distended, non-tender   Extremeties: no edema, cyanosis  Skin: normal turgor, non-icteric  Neuro: no focal neurological deficit        Labs:  I have reviewed all pertinent lab results as below:  Results for orders placed or performed during the hospital encounter of 07/24/23 (from the past 24 hours)   BASIC METABOLIC PANEL, NON-FASTING   Result Value Ref Range    SODIUM 140 136 - 145 mmol/L    POTASSIUM 3.9 3.5 - 5.1 mmol/L    CHLORIDE 102 96  - 111 mmol/L    CO2 TOTAL 25 22 - 30 mmol/L    ANION GAP 13 4 - 13 mmol/L    CALCIUM  9.2 8.6 - 10.2 mg/dL    GLUCOSE 161 65 - 096 mg/dL    BUN 9 8 - 25 mg/dL    CREATININE 0.45 4.09 - 1.05 mg/dL    BUN/CREA RATIO 12 6 - 22    ESTIMATED GFR - FEMALE >90 >=60 mL/min/BSA   MAGNESIUM    Result Value Ref Range    MAGNESIUM  2.0 1.8 - 2.6 mg/dL   PHOSPHORUS   Result Value Ref Range    PHOSPHORUS 4.6 2.4 - 4.7 mg/dL   PT/INR   Result Value Ref Range    PROTHROMBIN TIME 12.9 9.2 - 14.0 seconds    INR 1.11 0.80 - 1.20   CBC WITH DIFF   Result Value Ref Range    WBC 9.6 3.7 - 11.0 x10^3/uL    RBC 4.01 3.85 - 5.22 x10^6/uL    HGB 13.4 11.5 - 16.0 g/dL    HCT 81.1 91.4 - 78.2 %    MCV 93.3 78.0 -  100.0 fL    MCH 33.4 (H) 26.0 - 32.0 pg    MCHC 35.8 (H) 31.0 - 35.5 g/dL    RDW-CV 32.4 40.1 - 02.7 %    PLATELETS 237 150 - 400 x10^3/uL    MPV 8.9 8.7 - 12.5 fL    NEUTROPHIL % 60.0 %    LYMPHOCYTE % 26.6 %    MONOCYTE % 7.7 %    EOSINOPHIL % 4.9 %    BASOPHIL % 0.5 %    NEUTROPHIL # 5.76 1.50 - 7.70 x10^3/uL    LYMPHOCYTE # 2.55 1.00 - 4.80 x10^3/uL    MONOCYTE # 0.74 0.20 - 1.10 x10^3/uL    EOSINOPHIL # 0.47 <=0.50 x10^3/uL    BASOPHIL # <0.10 <=0.20 x10^3/uL    IMMATURE GRANULOCYTE % 0.3 0.0 - 1.0 %    IMMATURE GRANULOCYTE # <0.10 <0.10 x10^3/uL   PTT (PARTIAL THROMBOPLASTIN TIME)   Result Value Ref Range    APTT 29.4 24.3 - 37.6 seconds   TROPONIN-I   Result Value Ref Range    TROPONIN-I HS <2.7 <=14.0 ng/L ng/L   LACTIC ACID LEVEL W/ REFLEX FOR LEVEL >2.0   Result Value Ref Range    LACTIC ACID 0.9 0.5 - 2.2 mmol/L   POC BLOOD GLUCOSE (RESULTS)   Result Value Ref Range    GLUCOSE, POC 118 65 - 125 mg/dl         Radiology:  (images and reports personally reviewed):    Results for orders placed or performed during the hospital encounter of 07/24/23 (from the past 24 hours)   XR ABD X-RAY CHECK DOBHOFF PLACEMENT     Status: None    Narrative    Amir Nazaryan  Female, 53 years old.    XR ABD X-RAY CHECK DOBHOFF PLACEMENT performed on  07/24/2023 1:41 AM.    REASON FOR EXAM:  ng tube placement    TECHNIQUE: 1 views/1 images submitted for interpretation.      Impression    The enteric tube terminates above the left diaphragm but is likely within the stomach given the known hernia.   XR AP MOBILE CHEST     Status: None (Preliminary result)    Narrative    EXAM: XR CHEST 1 VIEW    DATE: 07/24/2023 1:41 AM    COMPARISON: Chest radiograph 07/23/2023    REASON FOR EXAM:  chest pain,,, and NG tube placement    TECHNIQUE: AP chest    FINDINGS:     Lines, tubes and hardware: Enteric tube visualized passing below level of the diaphragm with the tip projecting over the expected location the gastric body.    Lungs and pleura: Stable elevation of the left hemidiaphragm. Probable small left pleural effusion. No pneumothorax.    Heart and mediastinum: The heart size is normal for technique. The mediastinal contours are normal.      Bones: No acute bony abnormality is identified.      Impression    1. Appropriately positioned NG tube.  2. Stable elevation of the left hemidiaphragm with stable left basilar opacity representing atelectasis/pleural effusion.            ASSESSMENT:  Patient is a 53 year old female who is being evaluated for a left diaphragmatic hernia that developed following left nephrectomy at [facility] on 06/19/23.    PLAN:    Left diaphragmatic hernia   -Tentative OR tomorrow, 6/20, for robotic-assisted laparoscopy with diaphragmatic hernia repair, EGD, possible laparotomy    -Will  obtain consent   -Maintain strict NPO   -mIVFs   -NG tube to low intermittent suction   -Rest of care per primary team   -Thank you for this consult. We will continue to follow.      I independently of the faculty provider spent a total of (15) minutes in direct/indirect care of this patient including initial evaluation, review of laboratory, radiology, diagnostic studies, review of medical record, order entry and coordination of care.      Katherina Pan, PA-C        I both  saw and examined the patient today in concert with Katherina Pan, PA-C and the resident team. Any pertinent interval imaging studies and labs were personally reviewed. See her note for details.  I agree with the assessment and plan as outlined. Any exceptions or additions are edited/noted.    At present she does not appear to have concerns for ongoing ischemia and had her last dose of Eliquis yesterday. Plan to proceed to the OR tomorrow with  robotic approach with possible conversion to laparotomy. To be kept strictly NPO, ok to receive heparin  gtt to be held 6 hours prior to procedure.    Madelynn Schilder, MD

## 2023-07-24 NOTE — Consults (Signed)
 [facility]  General Surgery [facility] Consultation         [place], 53 y.o. female  Date of Birth:  03-11-1970  Date of service: 07/24/2023  Encounter Start Date: 07/24/2023  Inpatient Admission Date:  07/24/2023    Information Obtained from: patient and history reviewed via medical record  Chief Complaint: diaphragmatic hernia    ASSESSMENT / PLAN:   53 y.o. 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 No current signs of ischemia to warrant urgent operative intervention. WBC 9.6, lactate 0.9, abdominal exam benign.    - Keep NPO  - Maintain NGT to LIS with mIVF  - Hold anticoagulation until operative plan finalized  - Page general surgery blue with questions or concerns    HPI:   Elizabeth [name] is a 53 y.o. 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 Pt presented to outside hospital 6/11 with acute chest pain and workup demonstrated new segmental and subsegmental PE. A new stomach containing diaphragmatic hernia with air fluid levels was present on this scan. She was admitted and initiated on heparin  gtt and transitioned to Eliquis for discharge on 6/14. She re-presented to outside hospital for worsening chest pain and CT demonstrated increased size of stomach containing diaphragmatic hernia with air fluid levels. Transferred to [facility] for further management. Pt currently reports pain from the NGT. Denies abdominal pain, chest pain, nausea,  SOB, fevers or chills. She denies any difficulty with food intake and hasn't had any vomiting. Last dose of Eliquis 6/18 morning. PSH includes open left nephrectomy 06/19/23 and remote laparoscopic cholecystectomy.     ROS Other than ROS in the HPI, all other systems were negative.    PAST MEDICAL/ SURGICAL/FAMILY/ SOCIAL  HISTORY:       Past Medical History:   Diagnosis Date    Anemia     Cancer (CMS HCC)     brain tumor 8/15 with chemo and radiation    Chest pain, unspecified type 07/17/2023    CPAP (continuous positive airway pressure) dependence     CVA (cerebrovascular accident)     mini during brain surgery per pt    Diabetes mellitus, type 2     Disorder of thyroid     Dyspnea, unspecified type 07/17/2023    Epileptic seizure (CMS HCC)     Essential hypertension     History of kidney surgery 06/2023    Pulmonary embolism 07/16/2023    Sleep apnea          Past Surgical History:   Procedure Laterality Date    BRAIN SURGERY      HX CHOLECYSTECTOMY           Allergies[1]  Medications Prior to Admission       Prescriptions    apixaban (ELIQUIS) 5 mg Oral Tablet    Take 2 Tablets (10 mg total) by mouth Twice daily for 11 doses    apixaban (ELIQUIS) 5 mg Oral Tablet    Take 1 Tablet (5 mg total) by mouth Twice daily for 30 days    atorvastatin  (LIPITOR) 20 mg Oral Tablet    Take 1 Tablet (20 mg total) by mouth Every night    ergocalciferol, vitamin D2, (DRISDOL) 1,250 mcg (50,000 unit) Oral Capsule    Take 1 Capsule (  50,000 Units total) by mouth Every Sunday    escitalopram  oxalate (LEXAPRO ) 20 mg Oral Tablet    Take 1 Tablet (20 mg total) by mouth Daily    famotidine  (PEPCID ) 20 mg Oral Tablet    Take 1 Tablet (20 mg total) by mouth Daily    FREESTYLE LANCETS 28 gauge Misc    TEST ONCE DAILY    FREESTYLE LITE METER Kit    EVERY DAY    FREESTYLE LITE STRIPS Strip    TEST EVERY DAY    hydroCHLOROthiazide  (HYDRODIURIL ) 25 mg Oral Tablet [Paused]    Take 1 Tablet (25 mg total) by mouth Every morning    levothyroxine  (SYNTHROID ) 25 mcg Oral Tablet    TAKE 1 TABLET ONCE A DAY IN THE MORNING 30 MINUTES BEFORE MEALS OR ANY OTHER MEDICATIONS    losartan  (COZAAR ) 100 mg Oral Tablet    Take 1 Tablet (100 mg total) by mouth Daily    magnesium  Oxide 420 mg Oral Tablet    Take 1 Tablet (420 mg total) by mouth Daily    methocarbamoL  (ROBAXIN ) 500  mg Oral Tablet    Take 1 Tablet (500 mg total) by mouth Three times a day as needed    Patient not taking:  Reported on 07/24/2023    phenytoin  sodium extended release (DILANTIN ) 100 mg Oral Capsule    TAKE 3 CAPSULES BY MOUTH TWICE A DAY    semaglutide  (OZEMPIC ) 0.25 mg or 0.5 mg(2 mg/1.5 mL) Subcutaneous Pen Injector    Inject 0.5 mg under the skin Every 7 days           Family Medical History:       Problem Relation (Age of Onset)    Arthritis-osteo Mother    Breast Cancer Paternal Grandmother    Heart Disease Father    Hypertension (High Blood Pressure) Mother    No Known Problems Sister, Brother, Maternal Grandmother, Maternal Grandfather, Paternal Grandfather, Daughter, Son, Maternal Aunt, Maternal Uncle, Paternal Aunt, Paternal Uncle, Other            Social History[2]    Current Inpatient Medications:  D5W 250 mL flush bag, , Intravenous, Q15 Min PRN  fosphenytoin (CEREBYX) 200 mg in NS 50 mL IVPB, 200 mg, Intravenous, Q8H  NS 250 mL flush bag, , Intravenous, Q15 Min PRN  NS flush syringe, 2-6 mL, Intracatheter, Q8HRS  NS flush syringe, 2-6 mL, Intracatheter, Q1 MIN PRN        PHYSICAL EXAM:   Temperature: 36.6 C (97.8 F)  Heart Rate: 80  BP (Non-Invasive): (!) 149/86  Respiratory Rate: 17  SpO2: 93 %    GEN:  NAD, obese  PULM:  Normal respiratory effort.   CV:  Regular rate and rhythm   ABD:  Soft, nontender, and nondistended. NGT in place draining brown contents. Nephrectomy incision healing well with exposed vicryl suture in 2 areas  MS: Atraumatic.  Moving all extremities  NEURO:  Alert and oriented to person, place and time.  Cranial nerves grossly intact.    INTEGUMENTARY:  Warm and dry  PSYCHOSOCIAL: Appropriate behavior for situation    LABS REVIEWED:   Lab Results Today:    Results for orders placed or performed during the hospital encounter of 07/24/23 (from the past 24 hours)   BASIC METABOLIC PANEL, NON-FASTING   Result Value Ref Range    SODIUM 140 136 - 145 mmol/L    POTASSIUM 3.9 3.5 - 5.1  mmol/L    CHLORIDE 102  96 - 111 mmol/L    CO2 TOTAL 25 22 - 30 mmol/L    ANION GAP 13 4 - 13 mmol/L    CALCIUM  9.2 8.6 - 10.2 mg/dL    GLUCOSE 161 65 - 096 mg/dL    BUN 9 8 - 25 mg/dL    CREATININE 0.45 4.09 - 1.05 mg/dL    BUN/CREA RATIO 12 6 - 22    ESTIMATED GFR - FEMALE >90 >=60 mL/min/BSA   MAGNESIUM    Result Value Ref Range    MAGNESIUM  2.0 1.8 - 2.6 mg/dL   PHOSPHORUS   Result Value Ref Range    PHOSPHORUS 4.6 2.4 - 4.7 mg/dL   PT/INR   Result Value Ref Range    PROTHROMBIN TIME 12.9 9.2 - 14.0 seconds    INR 1.11 0.80 - 1.20   CBC WITH DIFF   Result Value Ref Range    WBC 9.6 3.7 - 11.0 x10^3/uL    RBC 4.01 3.85 - 5.22 x10^6/uL    HGB 13.4 11.5 - 16.0 g/dL    HCT 81.1 91.4 - 78.2 %    MCV 93.3 78.0 - 100.0 fL    MCH 33.4 (H) 26.0 - 32.0 pg    MCHC 35.8 (H) 31.0 - 35.5 g/dL    RDW-CV 95.6 21.3 - 08.6 %    PLATELETS 237 150 - 400 x10^3/uL    MPV 8.9 8.7 - 12.5 fL    NEUTROPHIL % 60.0 %    LYMPHOCYTE % 26.6 %    MONOCYTE % 7.7 %    EOSINOPHIL % 4.9 %    BASOPHIL % 0.5 %    NEUTROPHIL # 5.76 1.50 - 7.70 x10^3/uL    LYMPHOCYTE # 2.55 1.00 - 4.80 x10^3/uL    MONOCYTE # 0.74 0.20 - 1.10 x10^3/uL    EOSINOPHIL # 0.47 <=0.50 x10^3/uL    BASOPHIL # <0.10 <=0.20 x10^3/uL    IMMATURE GRANULOCYTE % 0.3 0.0 - 1.0 %    IMMATURE GRANULOCYTE # <0.10 <0.10 x10^3/uL   PTT (PARTIAL THROMBOPLASTIN TIME)   Result Value Ref Range    APTT 29.4 24.3 - 37.6 seconds   TROPONIN-I   Result Value Ref Range    TROPONIN-I HS <2.7 <=14.0 ng/L ng/L   LACTIC ACID LEVEL W/ REFLEX FOR LEVEL >2.0   Result Value Ref Range    LACTIC ACID 0.9 0.5 - 2.2 mmol/L         IMAGING REVIEWED:      CTA chest for PE 07/23/23:  1.Redemonstration of acute pulmonary emboli involving the left lower lobe segmental and subsegmental pulmonary arteries, similar to recent prior. No evidence of right heart strain.  2.Large left posterior diaphragmatic hernia containing the fundus and proximal body of the stomach which is distended with air-fluid level, increased from  prior and raising concern for gastric obstruction. Surgical consultation is advised.  3.Increased atelectasis in the left lower lobe with near complete collapse likely secondary to a combination of the diaphragmatic hernia and enlarging left pleural effusion. Can not exclude underlying acute infiltrate or pulmonary infarct.  4.Small left pleural effusion has increased from prior.  5.Unchanged pulmonary nodules. Pulmonary metastasis is a possibility. Continued follow-up is recommended.    CTA chest 6/11, per my interpretation:  - stomach containing left diaphragmatic hernia. No pneumatosis present, iar fluid level present    Catheryn Cluck, DO   07/24/2023, 01:21       I saw and examined the patient.  I reviewed the resident's note.  I agree with the findings and plan of care as documented in the resident's note.  Any exceptions/additions are edited/noted.    L diaphragmatic hernia containing large portion of stomach  Clinically no evidence of gastric ischemia, no ischemic changes on CT  NPO, NGT decompression  Thoracic consult  Serial exams  If thoracic plans for diaphragm repair will be available for evaluation of abdominal viscera.    Llewellyn Riles, MD         [1]   Allergies  Allergen Reactions    Ace Inhibitors      Other reaction(s): Angioedema   [2]   Social History  Tobacco Use    Smoking status: Never    Smokeless tobacco: Never   Vaping Use    Vaping status: Never Used   Substance Use Topics    Alcohol use: Never    Drug use: Never

## 2023-07-24 NOTE — Care Management Notes (Signed)
 [facility]  Care Management Initial Evaluation    Patient Name: Elizabeth [name]  Date of Birth: 09-Nov-1970  Sex: female  Date/Time of Admission: 07/24/2023 12:11 AM  Room/Bed: 16/A  Payor: BLUE CROSS BLUE SHIELD / Plan: [place] [employer] PPO / Product Type: PPO /   Primary Care Providers:  [facility] (General)    Pharmacy Info:   Preferred Pharmacy       [id number] - [place], [place] - [address]    [address] [place] [address]    Phone: [phone] Fax: [phone]    Hours: Not open 24 hours    [facility]    [address] [place] [address]    Phone: [phone] Fax: [phone]    Hours: 24/7          Emergency Contact Info:   Extended Emergency Contact Information  Primary Emergency Contact: Hochberg,B.J.  Mobile Phone: [phone]  Relation: Son  Preferred language: English  Interpreter needed? No  Secondary Emergency Contact: Donnamaria Gable  Mobile Phone: [phone]  Relation: Sister    History:   Elizabeth [name] is a 53 y.o., female, admitted for Diaphragmatic hernia [K44.9]    Height/Weight: 160 cm (5' 3) / 83.2 kg (183 lb 6.8 oz)     LOS: 0 days   Admitting Diagnosis: Diaphragmatic hernia [K44.9]    Assessment:      07/24/23 1533   Assessment Details   Assessment Type Admission   Date of Care Management Update 07/24/23   Date of Next DCP Update 07/25/23   Readmission   Is this a readmission? Yes   Is this a scheduled readmission? No   During your last admission, did you name someone who would be helping you at home (Lay Caregiver)? Yes   Did your caregiver/support receive instructions and/or information during your last admission Yes   On a scale of 1-10, with 10 being very well and 1 being not at all, how well did you understand your discharge instructions and goals of care from your prior admission? 10   IF YOU WERE DISCHARGED TO HOME AFTER YOUR PRIOR ADMISSION, DID YOU HAVE ANY DISCHARGE  RESOURCES SUCH AS:? Patient did not return home after prior admission. Discharged to:   [facility]   Have you followed the recommended medication instructions that were given following your prior admission? Yes   Were you able to make your hospital follow-up appointments? No   If answered no, why? Did not feel the need to attend  (cancelled)   Did you reach out to a healthcare provider before returning to the hospital? No   What factors contributed to the patient's rehospitalization? (TO BE ANSWERED BY DCP) Chronic condition/exacerbation of index admission disease process   Insurance Information/Type   Insurance type Commercial   Employment/Financial   Patient has Prescription Coverage?  Yes   Financial/Environmental Concerns none   Living Environment   Select an age group to open lives with row.  Adult   Lives With child(ren), dependent;child(ren), adult  (autistic adult daughter)   Living Arrangements apartment   Home Safety   Home Assessment: No Problems Identified   Home Accessibility stairs to enter home   Custody and Legal Status   Do you have a court appointed guardian/conservator? No   Care Management Plan   Discharge Planning Status initial meeting   Projected Discharge Date 07/27/23  Discharge plan discussed with: Patient   CM will evaluate for rehabilitation potential yes   Discharge Needs Assessment   Equipment Currently Used at Home cane, quad;walker, front wheeled   Discharge Facility/Level of Care Needs Undetermined at this time   Transportation Available family or friend will provide;ambulance   Referral Information   Admission Type inpatient   Address Verified verified-no changes   Arrived From acute hospital, other   Acute Care Facility   [facility])   ADVANCE DIRECTIVES   Does the Patient have an Advance Directive? No, Information Offered and Refused  (wants to think about it)   LAY CAREGIVER    Appointed Lay Caregiver? Yes   Lay Caregiver Name B.J. Sprowl    Lay Caregiver Relationship to patient child   Lay Caregiver Contact Number [phone]     Presented to [facility] as direct admit transfer from [facility] for evaluation. 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 Admitted to [facility]. Thoracic Surgery consulted- plan for OR tomorrow for hernia repair. Gen Surg consulted.  PT/OT ordered- recommendations pending evaluation. Medical workup in process.     Lives w/ son and autistic adult daughter in an apartment with 12 STE. Transport at discharge- family or EMS depending on discharge disposition and if patient meets medical criteria for EMS transport. Patient was discharged from [facility] on 5/20 to [facility]. Per patient, after IPR, she was discharged home and was supposed to have home health but they never came because she had other things going on. Has FWW and cane at home. Denies any O2 or infusions. Demographics, insurance, PCP and pharmacy confirmed. No ACP documents on file- information offered and refuses, wishes to think about it. Patient requested to have son, B.J., appointed as lay caregiver.     Discharge Plan:  Undetermined at this time      The patient will continue to be evaluated for developing discharge needs.     Case Manager: Verlinda Gloss, SOCIAL WORKER  Phone: [phone]

## 2023-07-24 NOTE — Progress Notes (Signed)
 [facility]  INTERNAL MEDICINE  Progress Note     Name: Elizabeth [name], Elizabeth [name], 53 y.o. female Date of Service: 07/24/2023   AVW:[id number] LOS: 0   [facility] Attending: Clarita Cross, MD   Code Status: FULL CODE: ATTEMPT RESUSCITATION/CPR Admitted for: Diaphragmatic hernia     SUBJECTIVE:      Patient seen and examined at bedside this morning.  Admitted overnight with chest pain secondary to large diaphragmatic hernia.  Thoracic surgery consulted, plans for robotic assisted laparoscopic with diaphragmatic hernia repair tomorrow.  Patient states that her throat is a little sore but otherwise no other complaints.     OBJECTIVE:    EXAMINATION:  Temperature: 36.8 C (98.3 F) BP (Non-Invasive): (!) 148/80 Heart Rate: 87   Respiratory Rate: 20 SpO2: 93 % Weight: 83.2 kg (183 lb 6.8 oz)          Intake/Output Summary (Last 24 hours) at 07/24/2023 1142  Last data filed at 07/24/2023 0640  Gross per 24 hour   Intake --   Output 400 ml   Net -400 ml     Date of Last Bowel Movement: 07/24/23     General:  Appears stated age, in no acute distress  HEENT: NC/AT, Oropharynx clear, mucous membranes moist, Trachea midline.  Cardiovascular: RRR, no murmurs, rubs, gallops.  Respiratory: CTABL  GI: Bowel sounds normal; abdomen soft, non-tender to palpation  Extremities/Skin:  No edema. Warm and dry.  Neurological: Awake, A&O x 3.   Psychiatric: Normal mood and affect    LABS:    CBC Differential   Recent Labs     07/23/23  0344 07/24/23  0138   WBC 9.4 9.6   HGB 12.8 13.4   HCT 36.3 37.4   PLTCNT 240 237    Recent Labs     07/23/23  0344 07/24/23  0138   PMNS 47 60.0   LYMPHOCYTES 39  --    MONOCYTES 8 7.7   EOSINOPHIL 6  --    BASOPHILS 0  0.02 0.5  <0.10   PMNABS 4.47 5.76   LYMPHSABS 3.62* 2.55   MONOSABS 0.75 0.74   EOSABS 0.55* 0.47      BMP LFTs   Recent Labs     07/24/23  0138   SODIUM 140   POTASSIUM 3.9   CHLORIDE 102   CO2 25   BUN 9   CREATININE 0.76   ANIONGAP 13   BUNCRRATIO 12   GFR >90   CALCIUM  9.2    MAGNESIUM  2.0   PHOSPHORUS 4.6    Recent Labs     07/23/23  1424   UROBILINOGEN 0.2      CoAgs Blood Gas:   Recent Labs     07/24/23  0138   PROTHROMTME 12.9   INR 1.11   APTT 29.4    No results found for this encounter    Cardiac Markers Lipid Panel   Recent Labs     07/23/23  0344 07/23/23  0500 07/23/23  0649   TROPONINI 2 2 2     No results found for this encounter     IMAGING STUDIES:    Results for orders placed or performed during the hospital encounter of 07/24/23 (from the past 24 hours)   XR ABD X-RAY CHECK DOBHOFF PLACEMENT     Status: None    Narrative    Elizabeth [name]  Female, 53 years old.    XR ABD X-RAY CHECK DOBHOFF PLACEMENT performed on 07/24/2023  1:41 AM.    REASON FOR EXAM:  ng tube placement    TECHNIQUE: 1 views/1 images submitted for interpretation.      Impression    The enteric tube terminates above the left diaphragm but is likely within the stomach given the known hernia.   XR AP MOBILE CHEST     Status: None    Narrative    EXAM: XR CHEST 1 VIEW    DATE: 07/24/2023 1:41 AM    COMPARISON: Chest radiograph 07/23/2023    REASON FOR EXAM:  chest pain,,, and NG tube placement    TECHNIQUE: AP chest    FINDINGS:     Lines, tubes and hardware: Enteric tube visualized passing below level of the diaphragm with the tip projecting over the expected location the gastric body.    Lungs and pleura: Stable elevation of the left hemidiaphragm. Probable small left pleural effusion. No pneumothorax.    Heart and mediastinum: The heart size is normal for technique. The mediastinal contours are normal.      Bones: No acute bony abnormality is identified.      Impression    1. Appropriately positioned NG tube.  2. Stable elevation of the left hemidiaphragm with stable left basilar opacity representing atelectasis/pleural effusion.       PATHOLOGY & MICROBIOLOGY:    No results found for any visits on 07/24/23 (from the past 24 hours).    INPATIENT MEDICATIONS:  acetaminophen  (OFIRMEV ) 1,000 mg (10 mg/mL) IV 100  mL (tot vol), 1,000 mg, Intravenous, Q6H PRN  benzonatate (TESSALON) capsule, 100 mg, Oral, Q8H PRN  Correction/SSIP insulin  lispro 100 units/mL injection, 2-9 Units, Subcutaneous, 4x/day AC  D5W 250 mL flush bag, , Intravenous, Q15 Min PRN  dextrose  (GLUTOSE) 40% oral gel, 15 g, Oral, Q15 Min PRN  dextrose  50% (0.5 g/mL) injection - syringe, 12.5 g, Intravenous, Q15 Min PRN  fosphenytoin (CEREBYX) 200 mg in NS 50 mL IVPB, 200 mg, Intravenous, Q8H  glucagon  injection 1 mg, 1 mg, IntraMUSCULAR, Once PRN  NS 250 mL flush bag, , Intravenous, Q15 Min PRN  NS flush syringe, 2-6 mL, Intracatheter, Q8HRS  NS flush syringe, 2-6 mL, Intracatheter, Q1 MIN PRN  NS premix infusion, , Intravenous, Continuous         ASSESSMENT:     Elizabeth [name] is a 53 y.o. female with PMH of renal cell carcinoma s/p nephrectomy, remote astrocytoma brain cancer s/p resection, recent PE on Eliquis, diabetes mellitus type 2, hypertension, hyperlipidemia, and OSA who presented to [facility] with chest pain found to have large diaphragmatic hernia.  Thoracic surgery consulted plans for laparoscopic hernia repair tomorrow, 6/20.  Admitted to hospitalist service for further management.    Active Hospital Problems    Diagnosis    Primary Problem: Diaphragmatic hernia        PLAN:   Large diaphragmatic hernia  -thoracic surgery following   -plans for laparoscopic urinary per 6/20   -maintain strict NPO status  -maintenance IV fluids ordered  -IV Tylenol  q.6 hours p.r.n. 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  Seizure disorder  -takes Dilantin  at home  -continue fosphenytoin IV during strict NPO status    Renal cell carcinoma s/p nephrectomy  -follows outpatient with Urology, follow  up visit on tomorrow, we will need to  reschedule  -follows outpatient with Oncology follow up visit 6/26    Diabetes mellitus type 2  -takes Ozempic  at home  -continue SSI protocol    Hypertension  Hyperlipidemia  -takes Lipitor and losartan  at home  -currently holding    Mood disorder  -holding Lexapro     Gastroesophageal reflux disease  -holding Pepcid   ___    DVT/PE Prophylaxis - SCDs/ Venodynes/Impulse boots  Consults - thoracic surgery  Hardware (Lines, Drains, Foley, Tubes) - PIV  Diet - NPO  Activity - Up w/ assistance and Up in chair TID w/ all meals  Therapy - PT/OT    ANTICIPATED DISPOSITION PLANNING   Needs - TBD  Location - TBD    Clarita Cross, DO    On 07/24/2023 I spent a total visit time of 55 minutes. Time included review of tests and ordering tests, obtaining/reviewing history, examining the patient, communicating with consultants, documenting clinical information and counseling the patient and/or family regarding the diagnosis and management plan and coordination of care involved services directly related to patient care.    FOLLOW UP NOTE LEVEL 3 (TOTAL TIME > 50 MINUTES) ([id number])

## 2023-07-24 NOTE — H&P (Addendum)
 History & Physical - Hospitalist Service     Name: Elizabeth [name], Elizabeth [name]    Date of Birth:  1970/06/17                                PCP: [facility]   CODE STATUS: FULL CODE: ATTEMPT RESUSCITATION/CPR         CHIEF COMPLAINT: chest pain                                            ASSESSMENT / PLAN:     Elizabeth [name] is a 53 y.o., White female with a history of RCC s/p resection,  remote astrocytoma brain cancer s/p resection; recent PE on eliquis, DM, HTN, HL, OSA presents as a transfer for diaphragmatic hernia who initially presented to the outside facility for chest pain.      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 If unable to take for extended time, will switch to IV     -     OSA:  -CPAP       -              FLUIDS: NS 100 cc/hr  DVT/PE PROPHYLAXIS: SCDs/ Venodynes/Impulse boots        HISTORY OF PRESENT ILLNESS         Elizabeth [name] is a 53 y.o., White female with a history of RCC s/p resection,  remote  astrocytoma brain cancer s/p resection s/p chemo s/p radiation; recent PE on eliquis, DM, HTN, HL, OSA presents as a transfer for diaphragmatic hernia who initially presented to the outside facility for chest pain.             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  The patient tolerated  the procedure and was discharged.  Pathology later demonstrated renal cell carcinoma with cancer free margins.  Then one week ago the patient was seen at the outside ED for chest pain. She was found to have a PE at that time. The patient was started on heparin . A TTE performed did not demonstrate strain. She was transitioned to Eliquis and discharged. She was also to follow up with Oncology.             She then presented again yesterday to the outside facility with sharp, 8/10, left sided chest pain. There they repeated a CTA chest which again demonstrated a PE; however, it also demonstrated a large diaphragmatic hernia.  An NG tube was placed and set to suction. She was transferred here for further evaluation.       MEDICAL HISTORY      PMH   Cancer (CMS [facility])     brain tumor 8/15 with chemo and radiation    CPAP (continuous positive airway pressure) dependence     CVA (cerebrovascular accident)     mini during brain surgery per pt    Diabetes mellitus, type 2     Disorder of thyroid     Epileptic seizure (CMS [facility])     Essential hypertension     History of kidney surgery 06/2023    Pulmonary embolism 07/16/2023    Sleep apnea      -HL    PSH  Past Surgical History:   Procedure Laterality Date    BRAIN SURGERY      HX CHOLECYSTECTOMY                  HOME MEDS:       Current Outpatient Medications   Medication Instructions    apixaban (ELIQUIS) 10 mg, Oral, 2 TIMES DAILY     atorvastatin  (LIPITOR) 20 mg, NIGHTLY    ergocalciferol, vitamin D2, (DRISDOL) 1,250 mcg (50,000 unit) Oral Capsule Take 1 Capsule (50,000 Units total) by mouth Every Sunday    escitalopram  oxalate (LEXAPRO ) 20 mg, Daily    famotidine  (PEPCID ) 20 mg, Daily    [Paused] hydroCHLOROthiazide  (HYDRODIURIL ) 25 mg, EVERY MORNING    levothyroxine  (SYNTHROID ) 25 mcg Oral Tablet TAKE 1 TABLET ONCE A DAY IN THE MORNING 30 MINUTES BEFORE MEALS OR ANY OTHER MEDICATIONS    losartan  (COZAAR ) 100 mg, Daily    magnesium  Oxide 420 mg, Daily    methocarbamoL  (ROBAXIN ) 500 mg, 3 TIMES DAILY PRN    Ozempic  0.5 mg, EVERY 7 DAYS    phenytoin  sodium extended release (DILANTIN ) 100 mg Oral Capsule TAKE 3 CAPSULES BY MOUTH TWICE A DAY              ALLERGIES:  Ace inhibitors     VACCINATIONS:  Influenza: Denies   Pneumovax: Denies                FAMILY HISTORY:  Mother: HTN   Father: CAD           SOCIAL HISTORY:  Nondrinker, nonsmoker,         REVIEW OF SYSTEMS    EXAMINATION      Constitutional: Negative for fever or chills.   Skin: Negative for rash.  HENT: +headache, negative for ear pain., +sore throat  Eyes: Negative for eye pain.   CV: Negative for chest pain and palpitations.   Respiratory: +cough(nonproductive), +shortness of breath, negative for wheeze.   GI: Negative for nausea, vomiting, abdominal pain, diarrhea.     GU: Negative for dysuria and frequency.   Musculoskeletal: Negative for muscle or joint pain.  Endo/Heme/Allergies: Negative for seasonal allergies  Neuro: Negative for numbness and tingling.    BP (!) 156/81   Pulse 89   Temp 36.7 C (98.1 F)   Resp 16   Ht 1.6 m (5' 3)   Wt 83.2 kg (183 lb 6.8 oz)   SpO2 96%   BMI 32.49 kg/m        General: NAD       HEENT:NC/AT  LUNGS: CTA, no wheeze no rhonchi  Cardiac: RRR, no murmur  Abdomen:Soft nontender, no guarding, no rebound tenderness  Musculoskeletal: No gross deformities, No pedal edema.   Neuro: Alert and conversing intelligibly, moves all 4  extremities  Lymph: No cervical adenopathy  Skin: Skin is warm and dry.        LABS    MICRO      I reviewed the labs for this encounter (notable labs listed below)            Alk phos: 136    D dimer: 1098            I reviewed the Micro data  for this encounter (notable results listed below)      - No new microbiology data             IMAGING    CARDIAC TESTING      I reviewed this encounter's imaging reports (notable findings below)    I reviewed the EKG for this encounter; my interpretation is documented below       CT PE protocol  Impression  1.Redemonstration of acute pulmonary emboli involving the left lower lobe segmental and subsegmental pulmonary arteries, similar to recent prior. No evidence of right heart strain.  2.Large left posterior diaphragmatic hernia containing the fundus and proximal body of the stomach which is distended with air-fluid level, increased from prior and raising concern for gastric obstruction. Surgical consultation is advised.  3.Increased atelectasis in the left lower lobe with near complete collapse likely secondary to a combination of the diaphragmatic hernia and enlarging left pleural effusion. Can not exclude underlying acute infiltrate or pulmonary infarct.  4.Small left pleural effusion has increased from prior.  5.Unchanged pulmonary nodules. Pulmonary metastasis is a possibility. Continued follow-up is recommended.      EKG: NSR    --------------------------------------------------  Medical Decision Making:     Problem: There is at least one new, significant, and acute issue with additional workup and management planned. Patient has comorbidity contributing to the complexity of this case.      Data:   All applicable labs tests, EKG tracings, and radiographs reports have been reviewed. I directly visualized and independently interpreted the EKG  I ordered multiple additional tests.      Risk: Risk of complications and/or morbidity or mortality is HIGH. - The patient needs  to be hospitalized

## 2023-07-24 NOTE — Care Management Notes (Signed)
 [facility]  Care Management Note    Patient Name: Elizabeth [name]  Date of Birth: [birth year]  Sex: female  Date/Time of Admission: 07/24/2023 12:11 AM  Room/Bed: 16/A  Payor: BLUE CROSS BLUE SHIELD / Plan: [place] [employer] PPO / Product Type: PPO /    LOS: 0 days   Primary Care Providers:  [facility] ([facility])    Admitting Diagnosis:  Diaphragmatic hernia [K44.9]    Assessment:      07/24/23 1533   CM Complete   Assessment Type Floor;ED   Interventions needed? Yes   Chart Reviewed Yes   Patient status? Inpatient   Initial Assessment Completed? Yes     ED MSW assisting with initial assessments. See next note for further documentation.       Discharge Plan:  Undetermined at this time      The patient will continue to be evaluated for developing discharge needs.     Case Manager: Verlinda Gloss, SOCIAL WORKER  Phone: [phone]

## 2023-07-24 NOTE — Nurses Notes (Signed)
 Pt admitted from [facility], presented to the ED for non radiating left side chest pain. One week ago pt was placed on Eliquis for a PE. CT scan of abdomen showed diagram hernia and there are concerns for gastric obstruction. Pt is alert and oriented, skin intact, and NG tube placed to intermittent suction.

## 2023-07-24 NOTE — Consults (Signed)
 [facility]  Thoracic Surgery Consultation         [place], 53 y.o. female  Date of Birth:  1970/02/25  Date of service: 07/24/2023  Encounter Start Date: 07/24/2023  Inpatient Admission Date:  07/24/2023    Information Obtained from: patient and history reviewed via medical record  Chief Complaint: diaphragmatic hernia    ASSESSMENT / PLAN:   53 y.o. female who is being evaluated for diaphragmatic hernia that developed following left nephrectomy. Nontoxic appearing. Vitals normal.     - Keep NPO  - Maintain NGT to LIS with mIVF  - Thoracic surgery available for OR    HPI:   Elizabeth [name] is a 53 y.o. 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 Pt presented to outside hospital 6/11 with acute chest pain and workup demonstrated new segmental and subsegmental PE. A new stomach containing diaphragmatic hernia with air fluid levels was present on this scan. She was admitted and initiated on heparin  gtt and transitioned to Eliquis for discharge on 6/14. She re-presented to outside hospital for worsening chest pain and CT demonstrated increased size of stomach containing diaphragmatic hernia with air fluid levels. Transferred to [facility] for further management. Pt currently reports pain from the NGT. Denies abdominal pain, chest pain, nausea,  SOB, fevers or chills. She denies any difficulty with food intake and hasn't had any vomiting. Last dose of Eliquis 6/18 morning. PSH includes open left nephrectomy 06/19/23 and remote laparoscopic cholecystectomy.     ROS Other than ROS in the HPI, all other systems were negative.    PAST MEDICAL/ SURGICAL/FAMILY/ SOCIAL HISTORY:       Past Medical History:   Diagnosis Date    Anemia     Cancer (CMS [facility])     brain tumor 8/15 with chemo and radiation    Chest pain, unspecified type  07/17/2023    CPAP (continuous positive airway pressure) dependence     CVA (cerebrovascular accident)     mini during brain surgery per pt    Diabetes mellitus, type 2     Disorder of thyroid     Dyspnea, unspecified type 07/17/2023    Epileptic seizure (CMS [facility])     Essential hypertension     History of kidney surgery 06/2023    Pulmonary embolism 07/16/2023    Sleep apnea          Past Surgical History:   Procedure Laterality Date    BRAIN SURGERY      HX CHOLECYSTECTOMY           Allergies[1]  Medications Prior to Admission       Prescriptions    apixaban (ELIQUIS) 5 mg Oral Tablet    Take 2 Tablets (10 mg total) by mouth Twice daily for 11 doses    apixaban (ELIQUIS) 5 mg Oral Tablet    Take 1 Tablet (5 mg total) by mouth Twice daily for 30 days    atorvastatin  (LIPITOR) 20 mg Oral Tablet    Take 1 Tablet (20 mg total) by mouth Every night    ergocalciferol, vitamin D2, (DRISDOL) 1,250 mcg (50,000 unit) Oral Capsule    Take 1 Capsule (50,000 Units total) by mouth Every Sunday    escitalopram  oxalate (LEXAPRO ) 20 mg Oral Tablet    Take 1 Tablet (20 mg  total) by mouth Daily    famotidine  (PEPCID ) 20 mg Oral Tablet    Take 1 Tablet (20 mg total) by mouth Daily    FREESTYLE LANCETS 28 gauge Misc    TEST ONCE DAILY    FREESTYLE LITE METER Kit    EVERY DAY    FREESTYLE LITE STRIPS Strip    TEST EVERY DAY    hydroCHLOROthiazide  (HYDRODIURIL ) 25 mg Oral Tablet [Paused]    Take 1 Tablet (25 mg total) by mouth Every morning    levothyroxine  (SYNTHROID ) 25 mcg Oral Tablet    TAKE 1 TABLET ONCE A DAY IN THE MORNING 30 MINUTES BEFORE MEALS OR ANY OTHER MEDICATIONS    losartan  (COZAAR ) 100 mg Oral Tablet    Take 1 Tablet (100 mg total) by mouth Daily    magnesium  Oxide 420 mg Oral Tablet    Take 1 Tablet (420 mg total) by mouth Daily    methocarbamoL  (ROBAXIN ) 500 mg Oral Tablet    Take 1 Tablet (500 mg total) by mouth Three times a day as needed    Patient not taking:  Reported on 07/24/2023    phenytoin  sodium extended  release (DILANTIN ) 100 mg Oral Capsule    TAKE 3 CAPSULES BY MOUTH TWICE A DAY    semaglutide  (OZEMPIC ) 0.25 mg or 0.5 mg(2 mg/1.5 mL) Subcutaneous Pen Injector    Inject 0.5 mg under the skin Every 7 days           Family Medical History:       Problem Relation (Age of Onset)    Arthritis-osteo Mother    Breast Cancer Paternal Grandmother    Heart Disease Father    Hypertension (High Blood Pressure) Mother    No Known Problems Sister, Brother, Maternal Grandmother, Maternal Grandfather, Paternal Grandfather, Daughter, Son, Maternal Aunt, Maternal Uncle, Paternal Aunt, Paternal Uncle, Other            Social History[2]    Current Inpatient Medications:  D5W 250 mL flush bag, , Intravenous, Q15 Min PRN  NS 250 mL flush bag, , Intravenous, Q15 Min PRN  NS flush syringe, 2-6 mL, Intracatheter, Q8HRS  NS flush syringe, 2-6 mL, Intracatheter, Q1 MIN PRN        PHYSICAL EXAM:   Temperature: 36.7 C (98.1 F)  Heart Rate: 89  BP (Non-Invasive): (!) 156/81  Respiratory Rate: 16  SpO2: 96 %    GEN:  NAD, obese  PULM:  Normal respiratory effort.   CV:  Regular rate and rhythm   ABD:  Soft, nontender, and nondistended. NGT in place draining brown contents. Nephrectomy incision healing well with exposed vicryl suture in 2 areas  MS: Atraumatic.  Moving all extremities  NEURO:  Alert and oriented to person, place and time.  Cranial nerves grossly intact.    INTEGUMENTARY:  Warm and dry  PSYCHOSOCIAL: Appropriate behavior for situation    LABS REVIEWED:   Lab Results Today:    Results for orders placed or performed during the hospital encounter of 07/24/23 (from the past 24 hours)   BASIC METABOLIC PANEL, NON-FASTING   Result Value Ref Range    SODIUM 140 136 - 145 mmol/L    POTASSIUM 3.9 3.5 - 5.1 mmol/L    CHLORIDE 102 96 - 111 mmol/L    CO2 TOTAL 25 22 - 30 mmol/L    ANION GAP 13 4 - 13 mmol/L    CALCIUM  9.2 8.6 - 10.2 mg/dL    GLUCOSE 956 65 -  125 mg/dL    BUN 9 8 - 25 mg/dL    CREATININE 4.78 2.95 - 1.05 mg/dL    BUN/CREA  RATIO 12 6 - 22    ESTIMATED GFR - FEMALE >90 >=60 mL/min/BSA   MAGNESIUM    Result Value Ref Range    MAGNESIUM  2.0 1.8 - 2.6 mg/dL   PHOSPHORUS   Result Value Ref Range    PHOSPHORUS 4.6 2.4 - 4.7 mg/dL   PT/INR   Result Value Ref Range    PROTHROMBIN TIME 12.9 9.2 - 14.0 seconds    INR 1.11 0.80 - 1.20   CBC WITH DIFF   Result Value Ref Range    WBC 9.6 3.7 - 11.0 x10^3/uL    RBC 4.01 3.85 - 5.22 x10^6/uL    HGB 13.4 11.5 - 16.0 g/dL    HCT 62.1 30.8 - 65.7 %    MCV 93.3 78.0 - 100.0 fL    MCH 33.4 (H) 26.0 - 32.0 pg    MCHC 35.8 (H) 31.0 - 35.5 g/dL    RDW-CV 84.6 96.2 - 95.2 %    PLATELETS 237 150 - 400 x10^3/uL    MPV 8.9 8.7 - 12.5 fL    NEUTROPHIL % 60.0 %    LYMPHOCYTE % 26.6 %    MONOCYTE % 7.7 %    EOSINOPHIL % 4.9 %    BASOPHIL % 0.5 %    NEUTROPHIL # 5.76 1.50 - 7.70 x10^3/uL    LYMPHOCYTE # 2.55 1.00 - 4.80 x10^3/uL    MONOCYTE # 0.74 0.20 - 1.10 x10^3/uL    EOSINOPHIL # 0.47 <=0.50 x10^3/uL    BASOPHIL # <0.10 <=0.20 x10^3/uL    IMMATURE GRANULOCYTE % 0.3 0.0 - 1.0 %    IMMATURE GRANULOCYTE # <0.10 <0.10 x10^3/uL   PTT (PARTIAL THROMBOPLASTIN TIME)   Result Value Ref Range    APTT 29.4 24.3 - 37.6 seconds   TROPONIN-I   Result Value Ref Range    TROPONIN-I HS <2.7 <=14.0 ng/L ng/L   LACTIC ACID LEVEL W/ REFLEX FOR LEVEL >2.0   Result Value Ref Range    LACTIC ACID 0.9 0.5 - 2.2 mmol/L         IMAGING REVIEWED:      CTA chest for PE 07/23/23:  1.Redemonstration of acute pulmonary emboli involving the left lower lobe segmental and subsegmental pulmonary arteries, similar to recent prior. No evidence of right heart strain.  2.Large left posterior diaphragmatic hernia containing the fundus and proximal body of the stomach which is distended with air-fluid level, increased from prior and raising concern for gastric obstruction. Surgical consultation is advised.  3.Increased atelectasis in the left lower lobe with near complete collapse likely secondary to a combination of the diaphragmatic hernia and  enlarging left pleural effusion. Can not exclude underlying acute infiltrate or pulmonary infarct.  4.Small left pleural effusion has increased from prior.  5.Unchanged pulmonary nodules. Pulmonary metastasis is a possibility. Continued follow-up is recommended.    CTA chest 6/11, per my interpretation:  - stomach containing left diaphragmatic hernia. No pneumatosis present, iar fluid level present    Catheryn Cluck, DO   07/24/2023, 01:21          [1]   Allergies  Allergen Reactions    Ace Inhibitors      Other reaction(s): Angioedema   [2]   Social History  Tobacco Use    Smoking status: Never    Smokeless tobacco: Never   Vaping Use  Vaping status: Never Used   Substance Use Topics    Alcohol use: Never    Drug use: Never

## 2023-07-25 ENCOUNTER — Telehealth (HOSPITAL_COMMUNITY): Payer: Self-pay

## 2023-07-25 ENCOUNTER — Inpatient Hospital Stay (HOSPITAL_COMMUNITY): Payer: Self-pay | Admitting: Registered Nurse

## 2023-07-25 ENCOUNTER — Encounter (HOSPITAL_COMMUNITY)
Admission: AD | Disposition: A | Discharge status: Rehabilitation Facility | Payer: Self-pay | Source: Other Acute Inpatient Hospital | Attending: THORACIC SURGERY CARDIOTHORACIC VASCULAR SURGERY

## 2023-07-25 ENCOUNTER — Ambulatory Visit (HOSPITAL_BASED_OUTPATIENT_CLINIC_OR_DEPARTMENT_OTHER): Admitting: Family

## 2023-07-25 DIAGNOSIS — E669 Obesity, unspecified: Secondary | ICD-10-CM

## 2023-07-25 DIAGNOSIS — F39 Unspecified mood [affective] disorder: Secondary | ICD-10-CM

## 2023-07-25 DIAGNOSIS — R569 Unspecified convulsions: Secondary | ICD-10-CM

## 2023-07-25 DIAGNOSIS — K219 Gastro-esophageal reflux disease without esophagitis: Secondary | ICD-10-CM

## 2023-07-25 DIAGNOSIS — K440 Diaphragmatic hernia with obstruction, without gangrene: Secondary | ICD-10-CM

## 2023-07-25 DIAGNOSIS — I2699 Other pulmonary embolism without acute cor pulmonale: Secondary | ICD-10-CM

## 2023-07-25 LAB — CBC WITH DIFF
BASOPHIL #: <0.1 10*3/uL (ref <=0.20)
BASOPHIL %: 0.7 %
EOSINOPHIL #: 0.45 10*3/uL (ref <=0.50)
EOSINOPHIL %: 4.2 %
HCT: 41.7 % (ref 34.8–46.0)
HGB: 14 g/dL (ref 11.5–16.0)
IMMATURE GRANULOCYTE #: <0.1 10*3/uL (ref <0.10)
IMMATURE GRANULOCYTE %: 0.4 % (ref 0.0–1.0)
LYMPHOCYTE #: 2.66 10*3/uL (ref 1.00–4.80)
LYMPHOCYTE %: 24.6 %
MCH: 31.5 pg (ref 26.0–32.0)
MCHC: 33.6 g/dL (ref 31.0–35.5)
MCV: 93.7 fL (ref 78.0–100.0)
MONOCYTE #: 0.87 10*3/uL (ref 0.20–1.10)
MONOCYTE %: 8 %
MPV: 8.8 fL (ref 8.7–12.5)
NEUTROPHIL #: 6.73 10*3/uL (ref 1.50–7.70)
NEUTROPHIL %: 62.1 %
PLATELETS: 251 10*3/uL (ref 150–400)
RBC: 4.45 10*6/uL (ref 3.85–5.22)
RDW-CV: 13.3 % (ref 11.5–15.5)
WBC: 10.8 10*3/uL (ref 3.7–11.0)

## 2023-07-25 LAB — POC ARTERIAL BLOOD GAS HVI (RESULT)
%FIO2 (ARTERIAL): 49 %
BASE EXCESS (ARTERIAL): -1.4 mmol/L (ref <=1.0)
BICARBONATE (ARTERIAL): 23.8 mmol/L (ref 21.0–28.0)
CARBOXYHEMOGLOBIN: 2.2 % (ref <=3.0)
CHLORIDE: 102 mmol/L (ref 98–107)
GLUCOSE: 166 mg/dL — ABNORMAL HIGH (ref 65–125)
HEMOGLOBIN: 12.9 g/dL (ref 12.0–18.0)
IONIZED CALCIUM: 1.1 mmol/L — ABNORMAL LOW (ref 1.15–1.33)
LACTATE: 1.3 mmol/L (ref <=1.9)
MET-HEMOGLOBIN: <0.7 % (ref <=1.5)
O2 SATURATION (ARTERIAL): 98.9 % (ref 94.0–98.0)
O2CT: 17.6 %
OXYHEMOGLOBIN: 96.5 % — ABNORMAL HIGH (ref 90.0–95.0)
PAO2/FIO2 RATIO: 196
PCO2 (ARTERIAL): 49 mmHg — ABNORMAL HIGH (ref 35–45)
PH (ARTERIAL): 7.32 — ABNORMAL LOW (ref 7.35–7.45)
PO2 (ARTERIAL): 96 mmHg (ref 83–108)
SODIUM: 138 mmol/L (ref 136–145)
WHOLE BLOOD POTASSIUM: 4 mmol/L (ref 3.5–5.1)

## 2023-07-25 LAB — ECG 12-LEAD
Atrial Rate: 79 {beats}/min
Calculated P Axis: 49 degrees
Calculated R Axis: 73 degrees
Calculated T Axis: 71 degrees
PR Interval: 172 ms
QRS Duration: 74 ms
QT Interval: 394 ms
QTC Calculation: 451 ms
Ventricular rate: 79 {beats}/min

## 2023-07-25 LAB — CROSSMATCH RED CELLS - UNITS: UNIT DIVISION: 0

## 2023-07-25 LAB — TYPE AND SCREEN
ABO/RH(D): O POS
ANTIBODY SCREEN: NEGATIVE
UNITS ORDERED: 1

## 2023-07-25 LAB — BASIC METABOLIC PANEL
ANION GAP: 13 mmol/L (ref 4–13)
BUN/CREA RATIO: 17 (ref 6–22)
BUN: 13 mg/dL (ref 8–25)
CALCIUM: 9.3 mg/dL (ref 8.6–10.2)
CHLORIDE: 103 mmol/L (ref 96–111)
CO2 TOTAL: 25 mmol/L (ref 22–30)
CREATININE: 0.77 mg/dL (ref 0.60–1.05)
ESTIMATED GFR - FEMALE: >90 mL/min/BSA (ref >=60)
GLUCOSE: 109 mg/dL (ref 65–125)
POTASSIUM: 3.9 mmol/L (ref 3.5–5.1)
SODIUM: 141 mmol/L (ref 136–145)

## 2023-07-25 LAB — POC BLOOD GLUCOSE (RESULTS)
GLUCOSE, POC: 113 mg/dL (ref 65–125)
GLUCOSE, POC: 108 mg/dL (ref 65–125)
GLUCOSE, POC: 113 mg/dL (ref 65–125)

## 2023-07-25 LAB — COLOGUARD® COLON CANCER SCREEN: COLOGUARD RESULT: NEGATIVE

## 2023-07-25 LAB — PHOSPHORUS: PHOSPHORUS: 4.8 mg/dL — ABNORMAL HIGH (ref 2.4–4.7)

## 2023-07-25 LAB — MAGNESIUM: MAGNESIUM: 2.1 mg/dL (ref 1.8–2.6)

## 2023-07-25 SURGERY — ROBOTIC DIAPHRAGMATIC HERNIA REPAIR
Anesthesia: General | Site: Mouth

## 2023-07-25 MED ORDER — DEXAMETHASONE SODIUM PHOSPHATE 4 MG/ML INJECTION SOLUTION
INTRAMUSCULAR | Status: AC
Start: 2023-07-25 — End: 2023-07-25
  Filled 2023-07-25: qty 1

## 2023-07-25 MED ORDER — LIDOCAINE (PF) 100 MG/5 ML (2 %) INTRAVENOUS SYRINGE
INJECTION | Freq: Once | INTRAVENOUS | Status: DC | PRN
Start: 2023-07-25 — End: 2023-07-26
  Administered 2023-07-25: 100 mg via INTRAVENOUS

## 2023-07-25 MED ORDER — SODIUM CHLORIDE 0.9 % (FLUSH) INJECTION SYRINGE
2.0000 mL | INJECTION | INTRAMUSCULAR | Status: DC | PRN
Start: 2023-07-25 — End: 2023-07-26

## 2023-07-25 MED ORDER — CHLORHEXIDINE GLUCONATE 0.12 % MOUTHWASH
15.0000 mL | MOUTHWASH | Freq: Two times a day (BID) | Status: DC
Start: 2023-07-25 — End: 2023-07-26
  Administered 2023-07-25: 15 mL via ORAL
  Filled 2023-07-25: qty 15

## 2023-07-25 MED ORDER — CEFAZOLIN 1 GRAM SOLUTION FOR INJECTION
Freq: Once | INTRAMUSCULAR | Status: DC | PRN
Start: 2023-07-25 — End: 2023-07-26
  Administered 2023-07-25 (×2): 2000 mg via INTRAVENOUS

## 2023-07-25 MED ORDER — SODIUM CHLORIDE 0.9 % (FLUSH) INJECTION SYRINGE
2.0000 mL | INJECTION | Freq: Three times a day (TID) | INTRAMUSCULAR | Status: DC
Start: 2023-07-25 — End: 2023-07-26
  Administered 2023-07-25 – 2023-07-26 (×2): 0 mL

## 2023-07-25 MED ORDER — PHENYLEPHRINE 50 MG/250 ML (200 MCG/ML) IN 0.9 % SODIUM CHLORIDE IV
INTRAVENOUS | Status: DC | PRN
Start: 2023-07-25 — End: 2023-07-26
  Administered 2023-07-25: 0 ug/kg/min via INTRAVENOUS
  Administered 2023-07-25: .5 ug/kg/min via INTRAVENOUS
  Administered 2023-07-25: .3 ug/kg/min via INTRAVENOUS
  Administered 2023-07-25: .2 ug/kg/min via INTRAVENOUS
  Administered 2023-07-26: 0 ug/kg/min via INTRAVENOUS
  Administered 2023-07-26 (×2): .2 ug/kg/min via INTRAVENOUS
  Administered 2023-07-26: 0 ug/kg/min via INTRAVENOUS

## 2023-07-25 MED ORDER — FENTANYL (PF) 50 MCG/ML INJECTION SOLUTION
25.0000 ug | INTRAMUSCULAR | Status: DC | PRN
Start: 2023-07-25 — End: 2023-07-26

## 2023-07-25 MED ORDER — DEXMEDETOMIDINE 4 MCG/ML IV DILUTION
Freq: Once | INTRAMUSCULAR | Status: DC | PRN
Start: 2023-07-25 — End: 2023-07-26
  Administered 2023-07-25: 12 ug via INTRAVENOUS
  Administered 2023-07-25 – 2023-07-26 (×3): 8 ug via INTRAVENOUS
  Administered 2023-07-26: 4 ug via INTRAVENOUS

## 2023-07-25 MED ORDER — FENTANYL (PF) 50 MCG/ML INJECTION SOLUTION
Freq: Once | INTRAMUSCULAR | Status: DC | PRN
Start: 2023-07-25 — End: 2023-07-26
  Administered 2023-07-25: 100 ug via INTRAVENOUS

## 2023-07-25 MED ORDER — MIDAZOLAM (PF) 1 MG/ML INJECTION SOLUTION
Freq: Once | INTRAMUSCULAR | Status: DC | PRN
Start: 2023-07-25 — End: 2023-07-26
  Administered 2023-07-25: 2 mg via INTRAVENOUS

## 2023-07-25 MED ORDER — LACTATED RINGERS INTRAVENOUS SOLUTION
INTRAVENOUS | Status: DC | PRN
Start: 2023-07-25 — End: 2023-07-26
  Administered 2023-07-26: 0 via INTRAVENOUS

## 2023-07-25 MED ORDER — ONDANSETRON HCL (PF) 4 MG/2 ML INJECTION SOLUTION
INTRAMUSCULAR | Status: AC
Start: 2023-07-25 — End: 2023-07-25
  Filled 2023-07-25: qty 2

## 2023-07-25 MED ORDER — FENTANYL (PF) 50 MCG/ML INJECTION SOLUTION
12.5000 ug | INTRAMUSCULAR | Status: DC | PRN
Start: 2023-07-25 — End: 2023-07-26

## 2023-07-25 MED ORDER — FENTANYL (PF) 50 MCG/ML INJECTION SOLUTION
INTRAMUSCULAR | Status: AC
Start: 2023-07-25 — End: 2023-07-25
  Filled 2023-07-25: qty 2

## 2023-07-25 MED ORDER — MIDAZOLAM 1 MG/ML INJECTION SOLUTION
INTRAMUSCULAR | Status: AC
Start: 2023-07-25 — End: 2023-07-25
  Filled 2023-07-25: qty 2

## 2023-07-25 MED ORDER — HYDROMORPHONE (PF) 0.5 MG/0.5 ML INJECTION SYRINGE
INJECTION | INTRAMUSCULAR | Status: AC
Start: 2023-07-25 — End: 2023-07-25
  Filled 2023-07-25: qty 0.5

## 2023-07-25 MED ORDER — DEXAMETHASONE SODIUM PHOSPHATE 4 MG/ML INJECTION SOLUTION
Freq: Once | INTRAMUSCULAR | Status: DC | PRN
Start: 2023-07-25 — End: 2023-07-26
  Administered 2023-07-25: 4 mg via INTRAVENOUS

## 2023-07-25 MED ORDER — ALBUMIN, HUMAN 5 % INTRAVENOUS SOLUTION
INTRAVENOUS | Status: DC | PRN
Start: 2023-07-25 — End: 2023-07-26
  Administered 2023-07-25 (×2): 0 via INTRAVENOUS

## 2023-07-25 MED ORDER — ELECTROLYTE-R (PH 7.4) INTRAVENOUS SOLUTION
INTRAVENOUS | Status: DC | PRN
Start: 2023-07-25 — End: 2023-07-26
  Administered 2023-07-25 – 2023-07-26 (×2): 0 via INTRAVENOUS

## 2023-07-25 MED ORDER — SODIUM CHLORIDE 0.9 % IV BOLUS
40.0000 mL | INJECTION | Freq: Once | Status: DC | PRN
Start: 2023-07-25 — End: 2023-08-03

## 2023-07-25 MED ORDER — PROPOFOL 10 MG/ML IV BOLUS
INJECTION | Freq: Once | INTRAVENOUS | Status: DC | PRN
Start: 2023-07-25 — End: 2023-07-26
  Administered 2023-07-25: 30 mg via INTRAVENOUS
  Administered 2023-07-25: 120 mg via INTRAVENOUS
  Administered 2023-07-26 (×2): 25 mg via INTRAVENOUS

## 2023-07-25 MED ORDER — HEPARIN (PORCINE) 1,000 UNIT/ML INJECTION SOLUTION
INTRAMUSCULAR | Status: AC
Start: 2023-07-25 — End: 2023-07-25
  Filled 2023-07-25: qty 10

## 2023-07-25 MED ORDER — SODIUM CHLORIDE 0.9% FLUSH BAG - 250 ML
INTRAVENOUS | Status: DC | PRN
Start: 2023-07-25 — End: 2023-07-26

## 2023-07-25 MED ORDER — LACTATED RINGERS INTRAVENOUS SOLUTION
INTRAVENOUS | Status: DC | PRN
Start: 2023-07-25 — End: 2023-07-25

## 2023-07-25 MED ORDER — DEXTROSE 5% IN WATER (D5W) FLUSH BAG - 250 ML
INTRAVENOUS | Status: DC | PRN
Start: 2023-07-25 — End: 2023-07-26

## 2023-07-25 MED ORDER — LACTATED RINGERS INTRAVENOUS SOLUTION
INTRAVENOUS | Status: DC
Start: 2023-07-25 — End: 2023-07-26

## 2023-07-25 MED ORDER — HYDROMORPHONE (PF) 0.5 MG/0.5 ML INJECTION SYRINGE
INJECTION | Freq: Once | INTRAMUSCULAR | Status: DC | PRN
Start: 2023-07-25 — End: 2023-07-26
  Administered 2023-07-25 (×2): .5 mg via INTRAVENOUS

## 2023-07-25 MED ORDER — HEPARIN (PORCINE) 1,000 UNIT/ML INJECTION SOLUTION
Freq: Once | INTRAMUSCULAR | Status: DC | PRN
Start: 2023-07-25 — End: 2023-07-26
  Administered 2023-07-25: 5000 [IU] via INTRAMUSCULAR

## 2023-07-25 MED ORDER — ACETAMINOPHEN 1,000 MG/100 ML (10 MG/ML) INTRAVENOUS SOLUTION
Freq: Once | INTRAVENOUS | Status: DC | PRN
Start: 2023-07-25 — End: 2023-07-26
  Administered 2023-07-25: 1000 mg via INTRAVENOUS

## 2023-07-25 MED ORDER — PHENYLEPHRINE 1 MG/10 ML (100 MCG/ML) IN 0.9 % SOD.CHLORIDE IV SYRINGE
INJECTION | Freq: Once | INTRAVENOUS | Status: DC | PRN
Start: 2023-07-25 — End: 2023-07-26
  Administered 2023-07-25: 100 ug via INTRAVENOUS
  Administered 2023-07-25: 200 ug via INTRAVENOUS
  Administered 2023-07-25 (×2): 100 ug via INTRAVENOUS

## 2023-07-25 MED ORDER — ONDANSETRON HCL (PF) 4 MG/2 ML INJECTION SOLUTION
4.0000 mg | Freq: Once | INTRAMUSCULAR | Status: DC | PRN
Start: 2023-07-25 — End: 2023-07-26

## 2023-07-25 MED ORDER — ROCURONIUM 10 MG/ML INTRAVENOUS SYRINGE WRAPPER
INJECTION | Freq: Once | INTRAVENOUS | Status: DC | PRN
Start: 2023-07-25 — End: 2023-07-26
  Administered 2023-07-25: 20 mg via INTRAVENOUS
  Administered 2023-07-25: 30 mg via INTRAVENOUS
  Administered 2023-07-25 (×3): 50 mg via INTRAVENOUS
  Administered 2023-07-25: 20 mg via INTRAVENOUS
  Administered 2023-07-25: 30 mg via INTRAVENOUS
  Administered 2023-07-26: 20 mg via INTRAVENOUS
  Administered 2023-07-26: 10 mg via INTRAVENOUS
  Administered 2023-07-26: 20 mg via INTRAVENOUS

## 2023-07-25 SURGICAL SUPPLY — 31 items
ARMBRD POSITION 20X8X2IN DVN FOAM (MED SURG SUPPLIES) ×3 IMPLANT
CONN 3/16-3/8IN DRAIN STEPDOWN STRL ADULT (MED SURG SUPPLIES) ×3 IMPLANT
CUSTOM THORACIC - LAP VATS ~~LOC~~ - RUBY MEMORIAL HOSPITAL (CUSTOM TRAYS & PACK) ×3 IMPLANT
DEVICE SUT ENDO STCH 10MM 15IN PSHBTN GRIP HNDL ENDOS SFT TISS STRL LF  DISP (ENDOSCOPIC SUPPLIES) ×3 IMPLANT
DRAIN INCS 24FR JP SIL CHNL STRL LF  RND FLUTE DISP WHT (MED SURG SUPPLIES) ×3 IMPLANT
DRAPE ARM 21X19X10.5IN DAVINCI XI EQP 21LB (DRAPE/PACKS/SHEETS/OR TOWEL) ×12 IMPLANT
DRAPE CLMN DAVINCI XI EQP (DRAPE/PACKS/SHEETS/OR TOWEL) ×3 IMPLANT
IRR SUCT STRKFL2 TIP STRL LF  DISP (ENDOSCOPIC SUPPLIES) ×6 IMPLANT
KIT ENDOS CMPLN ENDOKIT ORCAPOD 4 1.1OZ (ENDOSCOPIC SUPPLIES) ×3 IMPLANT
KIT RM TURNOVER STPC CUSTOM ALLIED XL GEN SURG (CUSTOM TRAYS & PACK) ×3 IMPLANT
MBO USE ITEM 130230 - DEVICE ENDOS ENDO PNUT 5MM 45CM SWAB COTTON DISP (ENDOSCOPIC SUPPLIES) ×3 IMPLANT
OBTURATOR LAPSCP 8MM WECK VST BLDLS STRL LF  DISP (ROBOTIC SUPPLIES) ×3 IMPLANT
OBTURATOR LAPSCP 8MM WECK VST LONG BLDLS LAPSCP STRL LF  DISP (ROBOTIC SUPPLIES) ×3 IMPLANT
PEG KIT 20FR RADOPQ FEED TUBE EXTERN RETENTION RING PUL MIC SECURLOK SIL 12ML STRL LF (PEG) ×3 IMPLANT
POSITION POSITION 9IN DONUT HEAD FOAM (MED SURG SUPPLIES) ×3 IMPLANT
REDUCER LAPSCP 8-12MM DAVINCI XI ENDOWRIST CANN (ROBOTIC SUPPLIES) ×3 IMPLANT
SEAL CANNULA 5-12 MM UNIVERSAL DISPOSABLE (ROBOTIC SUPPLIES) ×12 IMPLANT
SEALER TISS DAVINCI SLIM JAW XTD DISP (SURGICAL CUTTING SUPPLIES) ×3 IMPLANT
SET TUBING PNEUMOCLEAR HIFLO SMOKE EVAC (MED SURG SUPPLIES) ×3 IMPLANT
SHEARS SURG 26.84CM 8MM DAVINCI XI ENDOWRIST HARMON ACE CURVE US ENRG 2.2CM (ROBOTIC SUPPLIES) ×3 IMPLANT
SOL IRRG 0.9% NACL 1000ML PLASTIC PR BTL ISTNC N-PYRG STRL LF (MEDICATIONS/SOLUTIONS) ×3 IMPLANT
SOL IV 0.9% NACL PVC 1000ML PH 5 PLASTIC CONTAINR PRSV FR VIAFLEX LF (MEDICATIONS/SOLUTIONS) ×3 IMPLANT
SPONGE SURG 4X4IN CIGARETTE STY RL GAUZE KTNR STRL LAPSCP (MED SURG SUPPLIES) ×3 IMPLANT
STRAP POSITION KNEE FOAM SFT ADJ CNTCT CLSR LF (MED SURG SUPPLIES) ×3 IMPLANT
THORACIC - LAP VATS ~~LOC~~ - RUBY MEMORIAL HOSPITAL (CUSTOM TRAYS & PACK) ×3 IMPLANT
TROCAR LAPSCP 100MM 5MM KII FIOS Z THREAD SLEEVE 1ST ENTRY STRL LF  ACCESS SYS ABDOMINAL (ENDOSCOPIC SUPPLIES) ×3 IMPLANT
TROCAR LAPSCP STD 100MM 12MM VERSAONE FIX CANN BLDLS DLPHN NOSE TIP OPTC STRL LF  DISP (ENDOSCOPIC SUPPLIES) ×3 IMPLANT
TROCAR LAPSCP STD 100MM 12MM VERSAONE FIX CANN BLDLS DLPHN NOSE TIP STRL LF  DISP (ENDOSCOPIC SUPPLIES) ×3 IMPLANT
TROCAR LAPSCP STD 100MM 5MM VERSAONE FIX CANN BLDLS DLPHN NOSE TIP STRL LF  DISP (ENDOSCOPIC SUPPLIES) ×6 IMPLANT
TUBING INSFL PNEUMOSURE THRMPLST SET HIFLO TCHSCR 45L STRL DISP (ENDOSCOPIC SUPPLIES) ×3 IMPLANT
WATER STRL 1000ML PLASTIC PR BTL LF (MED SURG SUPPLIES) ×6 IMPLANT

## 2023-07-25 NOTE — Nurses Notes (Signed)
 Pt plan to recover on 5N. Glasses left at 2se pre/post bedside. RN delivered glasses to bedside in 8SESd-16.

## 2023-07-25 NOTE — Care Plan (Signed)
 OCCUPATIONAL THERAPY CONTACT NOTE     07/25/23 0755   Therapist Pager   OT Assigned/ Pager # OR [phone]/20*   Rehab Session   Document Type rehab contact note   OT Visit Date 07/25/23   Daily Activity AM-PAC/6-clicks Score   Patient Mobility Barrier Patient Awaiting/ Receiving Surgery     Per Thoracic Surg. note, possible OR (today), 6/20, for robotic-assisted laparoscopy with diaphragmatic hernia repair, EGD, possible laparotomy. OT to follow up after procedure as appropriate and as able.     Thank you for this referral,  Reche Hoar [facility] OTR/L  Pager# [phone]

## 2023-07-25 NOTE — Care Management Notes (Signed)
 [facility]  Care Management Note    Patient Name: Elizabeth [name]  Date of Birth: July 19, 1970  Sex: female  Date/Time of Admission: 07/24/2023 12:11 AM  Room/Bed: 16/A  Payor: BLUE CROSS BLUE SHIELD / Plan: [place] [employer] PPO / Product Type: PPO /    LOS: 1 day   Primary Care Providers:  [facility] ([facility])    Admitting Diagnosis:  Diaphragmatic hernia [K44.9]    Assessment:      07/25/23 0947   Assessment Details   Assessment Type Continued Assessment   Date of Care Management Update 07/25/23   Date of Next DCP Update 07/28/23   Care Management Plan   Discharge Planning Status plan in progress   Projected Discharge Date 07/28/23   Discharge plan discussed with: Patient   CM will evaluate for rehabilitation potential yes   Discharge Needs Assessment   Equipment Needed After Discharge none   Discharge Facility/Level of Care Needs Home vs Home with Home Health   Transportation Available family or friend will provide         Discharge Plan:  Home vs home with Home Health  Per Service, planned robotic assisted laparoscopic with diaphragmatic hernia repair. Not yet medically ready for D/C. CCC awaiting PT/OT evaluation for D/C planning. CCC to follow progress.     The patient will continue to be evaluated for developing discharge needs.     Case Manager: Deland Favre, CASE MANAGER  Phone: [phone]

## 2023-07-25 NOTE — Care Plan (Signed)
 Patient tolerates ambulation via assist X1; Fall precautions maintained and motion sensor pad in place. Tolerating a npo diet without complaints of nausea. LBM 6/20 and voids spontaneously without difficulty.  Pain managed with PRN medications. Dressings to NGT remain CDI. Discharge planning ongoing.       Problem: Adult Inpatient Plan of Care  Goal: Plan of Care Review  Outcome: Ongoing (see interventions/notes)  Flowsheets (Taken 07/25/2023 1723)  Plan of Care Reviewed With: patient  Goal: Patient-Specific Goal (Individualized)  Outcome: Ongoing (see interventions/notes)  Flowsheets (Taken 07/25/2023 1723)  Patient/Family-Specific Goals (Include Timeframe): awaiting hernia repair  Goal: Absence of Hospital-Acquired Illness or Injury  Outcome: Ongoing (see interventions/notes)  Intervention: Identify and Manage Fall Risk  Recent Flowsheet Documentation  Taken 07/25/2023 1645 by Sharyne LABOR, RN  Safety Promotion/Fall Prevention:   activity supervised   fall prevention program maintained   nonskid shoes/slippers when out of bed   motion sensor pad activated  Intervention: Prevent Skin Injury  Recent Flowsheet Documentation  Taken 07/25/2023 1645 by Sharyne LABOR, RN  Body Position: supine, head elevated  Skin Protection: adhesive use limited  Taken 07/25/2023 1600 by Sharyne LABOR, RN  Body Position: supine, head elevated  Intervention: Prevent and Manage VTE (Venous Thromboembolism) Risk  Recent Flowsheet Documentation  Taken 07/25/2023 1645 by Sharyne LABOR, RN  VTE Prevention/Management: dorsiflexion/plantar flexion performed  Intervention: Prevent Infection  Recent Flowsheet Documentation  Taken 07/25/2023 1645 by Sharyne LABOR, RN  Infection Prevention: barrier precautions utilized  Goal: Optimal Comfort and Wellbeing  Outcome: Ongoing (see interventions/notes)  Intervention: Provide Person-Centered Care  Recent Flowsheet Documentation  Taken 07/25/2023 1645 by Sharyne LABOR, RN  Trust Relationship/Rapport:   care explained   emotional  support provided   questions answered   reassurance provided     Problem: Wound  Goal: Optimal Coping  Outcome: Ongoing (see interventions/notes)  Intervention: Support Patient and Family Response  Recent Flowsheet Documentation  Taken 07/25/2023 1645 by Sharyne LABOR, RN  Family/Support System Care: self-care encouraged  Supportive Measures: active listening utilized  Goal: Optimal Functional Ability  Outcome: Ongoing (see interventions/notes)  Intervention: Optimize Functional Ability  Recent Flowsheet Documentation  Taken 07/25/2023 1645 by Sharyne LABOR, RN  Activity Management: patient refused/activity encouraged  Activity Assistance Provided: assistance, 1 person  Goal: Absence of Infection Signs and Symptoms  Outcome: Ongoing (see interventions/notes)  Intervention: Prevent or Manage Infection  Recent Flowsheet Documentation  Taken 07/25/2023 1645 by Sharyne LABOR, RN  Fever Reduction/Comfort Measures:   lightweight bedding   lightweight clothing  Goal: Improved Oral Intake  Outcome: Ongoing (see interventions/notes)  Goal: Optimal Pain Control and Function  Outcome: Ongoing (see interventions/notes)  Intervention: Prevent or Manage Pain  Recent Flowsheet Documentation  Taken 07/25/2023 1645 by Sharyne LABOR, RN  Sleep/Rest Enhancement: consistent schedule promoted  Goal: Skin Health and Integrity  Outcome: Ongoing (see interventions/notes)  Intervention: Optimize Skin Protection  Recent Flowsheet Documentation  Taken 07/25/2023 1645 by Sharyne LABOR, RN  Pressure Reduction Techniques: Frequent weight shifting encouraged  Pressure Reduction Devices: Repositioning wedges/pillows utilized  Activity Management: patient refused/activity encouraged  Head of Bed (HOB) Positioning: 30 degrees  Goal: Optimal Wound Healing  Outcome: Ongoing (see interventions/notes)  Intervention: Promote Wound Healing  Recent Flowsheet Documentation  Taken 07/25/2023 1645 by Sharyne LABOR, RN  Pressure Reduction Techniques: Frequent weight shifting  encouraged  Pressure Reduction Devices: Repositioning wedges/pillows utilized  Sleep/Rest Enhancement: consistent schedule promoted  Activity Management: patient refused/activity encouraged     Problem: Health Knowledge, Opportunity to Enhance (  Adult,Obstetrics,Pediatric)  Goal: Knowledgeable about Health Subject/Topic  Description: Patient will demonstrate the desired outcomes by discharge/transition of care.  Outcome: Ongoing (see interventions/notes)  Intervention: Enhance Health Knowledge  Recent Flowsheet Documentation  Taken 07/25/2023 1645 by Sharyne LABOR, RN  Family/Support System Care: self-care encouraged  Supportive Measures: active listening utilized     Problem: Skin Injury Risk Increased  Goal: Skin Health and Integrity  Outcome: Ongoing (see interventions/notes)  Intervention: Optimize Skin Protection  Recent Flowsheet Documentation  Taken 07/25/2023 1645 by Sharyne LABOR, RN  Pressure Reduction Techniques: Frequent weight shifting encouraged  Pressure Reduction Devices: Repositioning wedges/pillows utilized  Skin Protection: adhesive use limited  Activity Management: patient refused/activity encouraged  Head of Bed (HOB) Positioning: 30 degrees     Problem: Fall Injury Risk  Goal: Absence of Fall and Fall-Related Injury  Outcome: Ongoing (see interventions/notes)  Intervention: Identify and Manage Contributors  Recent Flowsheet Documentation  Taken 07/25/2023 1645 by Sharyne LABOR, RN  Self-Care Promotion: independence encouraged  Intervention: Promote Injury-Free Environment  Recent Flowsheet Documentation  Taken 07/25/2023 1645 by Sharyne LABOR, RN  Safety Promotion/Fall Prevention:   activity supervised   fall prevention program maintained   nonskid shoes/slippers when out of bed   motion sensor pad activated

## 2023-07-25 NOTE — Transitional Care (Signed)
 [facility]  Transitional Care Coordination  Initial Assessment    Name: [facility]  Address: [address]  [place] [address]  Phone: [phone]   Date of Birth: 1970/10/25  Date of Service: 07/25/2023      PCP information:  No primary care provider on file.  Phone: None  Fax: None      Chart Review:  Patient Status: Chart Review  30 Day Readmission: Yes        MyChart Status: Active             Preferred Pharmacy       [id number] - [place], [place] - [address]    [address] [place] [address]    Phone: [phone] Fax: [phone]    Hours: Not open 24 hours    [facility]    [address] [facility] [place] [address]    Phone: [phone] Fax: [phone]    Hours: 24/7             07/25/23 1123   Assessment Type   Assessment type Initial Assessment   Initial Assessment   Initial Assessment 1st Atttempt;Completed   Obtained verbal consent to contact and schedule a PCP follow up appointment within 7-14 days of discharge? No  (Patient declined help finding a pcp.  She will look for one after discharged to home.)   Obtained verbal consent to follow up with the TCC when necessary Yes  (Will need TCC telehealth for hospital follow up)   Apt Preferences No Preference   Time preference Afternoon   Transportation for healthcare appointments Family transports   Obtained verbal consent to speak to lay caregiver Yes   Name of [place] Caregiver EBONI COVAL mobile [phone]   Relationship of lay caregiver Son   Discharge to home address listed in chart? Yes   Is home address a P.O Box? No   Is pt being discharged on Warfarin (Coumadin)? No            Transition Care Assistant: Verneita Bald, MA  Phone Ext: [phone]

## 2023-07-25 NOTE — Discharge Instructions (Addendum)
 If you have any questions regarding your discharge instructions, please call [phone]-CARE [phone]). Our transitions nurses are available to assist you Monday thru Friday from 8 am-4:30 pm.  If this is after 4:30 pm, a weekend, or holiday please call and ask to speak to the doctor on call for the Hospitalist team.  In case of an emergency please call 911.    Discharge Recommendations/ Plan:Discharge JW:[id number] Rehab Placement/Return (not psych) (code 23)      Resources: Nursing call report to [facility] [phone]

## 2023-07-25 NOTE — Anesthesia Procedure Notes (Signed)
 Elizabeth [name]    Arterial Line Procedure    Pt location: In OR    Universal protocol:     Patient identity confirmed:  Hospital-assigned identification number and arm band  Pre-procedure details:     Preparation: Preprocedure hand washing was performed; sterile field was maintained         Skin Prep used: Chlorhexidine  gluconate and Isopropyl alcohol  Anesthesia (see MAR for exact dosages):     Anesthesia method:  Under general anesthesia    A 20 G Catheter type: Arrow 1 and 3/4 inch in length,  Placed on the right  radial artery  using anatomical landmarks, guidewire and palpation With  number of attempts:1.Secured with: transparent dressing   MEDICATIONS:     Post-procedure details:    Patient tolerance of procedure:  Tolerated well, no immediate complications blood withdrawn easily, flushes easily, good waveform, Correlates with cuff, Flush/aspirate well and Waveform appropriate  Complications:none  Performed By:  Performing provider: Shoshana Jacobsen, MD Authorizing provider: Kassie Donnice NOVAK, MD      Comments:      Jacobsen Shoshana, MD

## 2023-07-25 NOTE — Progress Notes (Signed)
 [facility]  INTERNAL MEDICINE  Progress Note     Name: Elizabeth, [name], 53 y.o. female Date of Service: 07/25/2023   FMW:[id number] LOS: 1   ERE:[facility] Attending: Milta Gee, MD   Code Status: FULL CODE: ATTEMPT RESUSCITATION/CPR Admitted for: Diaphragmatic hernia     SUBJECTIVE:      Patient seen and examined at bedside this morning.  No acute events overnight.  Plans for surgery today.  Denied any complaints.     OBJECTIVE:    EXAMINATION:  Temperature: 36.7 C (98 F) BP (Non-Invasive): 132/87 Heart Rate: 97   Respiratory Rate: 16 SpO2: 94 % Weight: 83.2 kg (183 lb 6.8 oz)          Intake/Output Summary (Last 24 hours) at 07/25/2023 1057  Last data filed at 07/25/2023 0550  Gross per 24 hour   Intake 150 ml   Output 1300 ml   Net -1150 ml     Date of Last Bowel Movement: 07/24/23     General:  Appears stated age, in no acute distress  HEENT: NC/AT, Oropharynx clear, mucous membranes moist, Trachea midline.  Cardiovascular: RRR, no murmurs, rubs, gallops.  Respiratory: CTABL  GI: Bowel sounds normal; abdomen soft, non-tender to palpation  Extremities/Skin:  No edema. Warm and dry.  Neurological: Awake, A&O x 3.   Psychiatric: Normal mood and affect    LABS:    CBC Differential   Recent Labs     07/23/23  0344 07/24/23  0138 07/25/23  0603   WBC 9.4 9.6 10.8   HGB 12.8 13.4 14.0   HCT 36.3 37.4 41.7   PLTCNT 240 237 251    Recent Labs     07/23/23  0344 07/24/23  0138 07/25/23  0603   PMNS 47 60.0 62.1   LYMPHOCYTES 39  --   --    MONOCYTES 8 7.7 8.0   EOSINOPHIL 6  --   --    BASOPHILS 0  0.02 0.5  <0.10 0.7  <0.10   PMNABS 4.47 5.76 6.73   LYMPHSABS 3.62* 2.55 2.66   MONOSABS 0.75 0.74 0.87   EOSABS 0.55* 0.47 0.45      BMP LFTs   Recent Labs     07/25/23  0603   SODIUM 141   POTASSIUM 3.9   CHLORIDE 103   CO2 25   BUN 13   CREATININE 0.77   ANIONGAP 13   BUNCRRATIO 17   GFR >90   CALCIUM  9.3   MAGNESIUM  2.1   PHOSPHORUS 4.8*    No results found for this encounter     CoAgs Blood Gas:    No results found for this encounter   No results found for this encounter    Cardiac Markers Lipid Panel   Recent Labs     07/23/23  0344 07/23/23  0500 07/23/23  0649   TROPONINI 2 2 2     No results found for this encounter     IMAGING STUDIES:           PATHOLOGY & MICROBIOLOGY:    No results found for any visits on 07/24/23 (from the past 24 hours).    INPATIENT MEDICATIONS:  acetaminophen  (OFIRMEV ) 1,000 mg (10 mg/mL) IV 100 mL (tot vol), 1,000 mg, Intravenous, Q6H PRN  benzonatate  (TESSALON ) capsule, 100 mg, Oral, Q8H PRN  chlorhexidine  gluconate (PERIDEX ) 0.12% mouthwash, 15 mL, Swish & Spit, 2x/day  Correction/SSIP insulin  lispro 100 units/mL injection, 2-9 Units, Subcutaneous, 4x/day AC  D5W 1/2 NS premix infusion, , Intravenous, Continuous  D5W 250 mL flush bag, , Intravenous, Q15 Min PRN  dextrose  (GLUTOSE) 40% oral gel, 15 g, Oral, Q15 Min PRN  dextrose  50% (0.5 g/mL) injection - syringe, 12.5 g, Intravenous, Q15 Min PRN  fosphenytoin  (CEREBYX ) 200 mg in NS 50 mL IVPB, 200 mg, Intravenous, Q8H  glucagon  injection 1 mg, 1 mg, IntraMUSCULAR, Once PRN  NS 250 mL flush bag, , Intravenous, Q15 Min PRN  NS bolus infusion 40 mL, 40 mL, Intravenous, Once PRN  NS flush syringe, 2-6 mL, Intracatheter, Q8HRS  NS flush syringe, 2-6 mL, Intracatheter, Q1 MIN PRN         ASSESSMENT:     Elizabeth [name] is a 53 y.o. female with PMH of renal cell carcinoma s/p nephrectomy, remote astrocytoma brain cancer s/p resection, recent PE on Eliquis , diabetes mellitus type 2, hypertension, hyperlipidemia, and OSA who presented to [facility] with chest pain found to have large diaphragmatic hernia.  Thoracic surgery consulted plans for laparoscopic hernia repair 6/20.  Admitted to hospitalist service for further management.    Active Hospital Problems    Diagnosis    Primary Problem: Diaphragmatic hernia        PLAN:   Large diaphragmatic hernia  -thoracic surgery following   -plans for laparoscopic surgery today,  6/20   -maintain strict NPO status  -maintenance IV fluids ordered  -IV Tylenol  q.6 hours p.r.n. 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  Seizure disorder  -takes Dilantin  at home  -continue fosphenytoin  IV during strict NPO status    Renal cell carcinoma s/p nephrectomy  -follows outpatient with Urology, follow up visit on tomorrow, we will need to reschedule  -follows outpatient with Oncology follow up visit 6/26    Diabetes mellitus type 2  -takes Ozempic  at home  -continue SSI protocol    Hypertension  Hyperlipidemia  -takes Lipitor and losartan  at home  -currently holding    Mood disorder  -holding Lexapro     Gastroesophageal reflux disease  -holding Pepcid     OSA  -holding CPAP given significant diaphragmatic hernia  ___    DVT/PE Prophylaxis - SCDs/ Venodynes/Impulse boots  Consults - thoracic surgery  Hardware (Lines, Drains, Foley, Tubes) - PIV  Diet - NPO  Activity - Up w/ assistance and Up in chair TID w/ all meals  Therapy - PT/OT    ANTICIPATED DISPOSITION PLANNING   Needs - TBD  Location - TBD    Norman Ply, DO    On 07/25/2023 I spent a total visit time of 55 minutes. Time included review of tests and ordering tests, obtaining/reviewing history, examining the patient, communicating with consultants, documenting clinical information and counseling the patient and/or family regarding the diagnosis and management plan and coordination of care involved services directly related to patient care.    FOLLOW UP NOTE LEVEL 3 (TOTAL TIME > 50 MINUTES) ([id number])

## 2023-07-25 NOTE — Anesthesia Preprocedure Evaluation (Signed)
 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            Dental           (+) missing           Pulmonary      (+) decreased breath sounds present        Cardiovascular    Rhythm: regular  Rate: Normal  (-) no friction rub, carotid bruit is not present, no peripheral edema and no murmur     Other findings  No loose teeth        Plan  ASA 4     Planned anesthesia type: general     general anesthesia with endotracheal tube intubation      PONV Plan:  I plan to administer pharmcologic prophalaxis antiemetics  POV PLAN:   plan for postoperative opioid use            Intravenous induction     Anesthesia issues/risks discussed are: Difficult Airway, Aspiration, Sore Throat, Blood Loss, Cardiac Events/MI, PONV, Art Line Placement, Post-op Agitation/Tantrum, Post-op Pain Management and Post-op Intubation/Ventilation.  Anesthetic plan and risks discussed with patient  signed consent obtained      Use of blood products discussed with patient who consented to blood products.      Patient's NPO status is appropriate for Anesthesia.  NPO Status: Full stomach precautions.         Plan discussed with resident and attending.

## 2023-07-25 NOTE — Progress Notes (Signed)
 [facility]  Thoracic Surgery   Progress Note      Dalary, Hollar  Date of Admission:  07/24/2023  Date of Birth:  [birth year]    Hospital Day:  LOS: 1 day   Date of Service:  07/25/2023    Subjective   No acute events overnight. Patient consented at bedside. Continues to be a surgical candidate.     Current Diet:    DIET NPO - NOW EXCEPT ALL MEDS WITH SIPS OF WATER     Current Medications:  benzonatate  (TESSALON ) capsule, 100 mg, Oral, Q8H PRN  chlorhexidine  gluconate (PERIDEX ) 0.12% mouthwash, 15 mL, Swish & Spit, 2x/day  Correction/SSIP insulin  lispro 100 units/mL injection, 2-9 Units, Subcutaneous, 4x/day AC  D5W 1/2 NS premix infusion, , Intravenous, Continuous  D5W 250 mL flush bag, , Intravenous, Q15 Min PRN  dextrose  (GLUTOSE) 40% oral gel, 15 g, Oral, Q15 Min PRN  dextrose  50% (0.5 g/mL) injection - syringe, 12.5 g, Intravenous, Q15 Min PRN  fosphenytoin  (CEREBYX ) 200 mg in NS 50 mL IVPB, 200 mg, Intravenous, Q8H  glucagon  injection 1 mg, 1 mg, IntraMUSCULAR, Once PRN  NS 250 mL flush bag, , Intravenous, Q15 Min PRN  NS bolus infusion 40 mL, 40 mL, Intravenous, Once PRN  NS flush syringe, 2-6 mL, Intracatheter, Q8HRS  NS flush syringe, 2-6 mL, Intracatheter, Q1 MIN PRN      Vital Signs:  Temp (24hrs) Max:37.2 C (98.9 F)  Temperature: 36.9 C (98.5 F)  BP (Non-Invasive): (!) 145/88  MAP (Non-Invasive): 99 mmHG  Heart Rate: 96  Respiratory Rate: 18  SpO2: 94 %  Base (Admission) Weight:  Weight: 83.2 kg (183 lb 6.8 oz)  Weight:  Weight: 83.2 kg (183 lb 6.8 oz)    Physical Exam:  General: no acute distress, appears stated age  Head: normocephalic, atraumatic  Chest: clear, normal excursion   Heart: RRR  Abdomen: soft, non-distended, non-tender   Extremeties: no edema, cyanosis  Skin: normal turgor, non-icteric  Neuro: no focal neurological deficit    Labs:  I have reviewed all pertinent lab results as below:  Results for orders placed or performed during the hospital encounter of 07/24/23 (from the  past 24 hours)   POC BLOOD GLUCOSE (RESULTS)   Result Value Ref Range    GLUCOSE, POC 93 65 - 125 mg/dl   POC BLOOD GLUCOSE (RESULTS)   Result Value Ref Range    GLUCOSE, POC 107 65 - 125 mg/dl   POC BLOOD GLUCOSE (RESULTS)   Result Value Ref Range    GLUCOSE, POC 113 65 - 125 mg/dl   BASIC METABOLIC PANEL   Result Value Ref Range    SODIUM 141 136 - 145 mmol/L    POTASSIUM 3.9 3.5 - 5.1 mmol/L    CHLORIDE 103 96 - 111 mmol/L    CO2 TOTAL 25 22 - 30 mmol/L    ANION GAP 13 4 - 13 mmol/L    CALCIUM  9.3 8.6 - 10.2 mg/dL    GLUCOSE 890 65 - 874 mg/dL    BUN 13 8 - 25 mg/dL    CREATININE 9.22 9.39 - 1.05 mg/dL    BUN/CREA RATIO 17 6 - 22    ESTIMATED GFR - FEMALE >90 >=60 mL/min/BSA   MAGNESIUM    Result Value Ref Range    MAGNESIUM  2.1 1.8 - 2.6 mg/dL   PHOSPHORUS   Result Value Ref Range    PHOSPHORUS 4.8 (H) 2.4 - 4.7 mg/dL   CBC WITH DIFF  Result Value Ref Range    WBC 10.8 3.7 - 11.0 x10^3/uL    RBC 4.45 3.85 - 5.22 x10^6/uL    HGB 14.0 11.5 - 16.0 g/dL    HCT 58.2 65.1 - 53.9 %    MCV 93.7 78.0 - 100.0 fL    MCH 31.5 26.0 - 32.0 pg    MCHC 33.6 31.0 - 35.5 g/dL    RDW-CV 86.6 88.4 - 84.4 %    PLATELETS 251 150 - 400 x10^3/uL    MPV 8.8 8.7 - 12.5 fL    NEUTROPHIL % 62.1 %    LYMPHOCYTE % 24.6 %    MONOCYTE % 8.0 %    EOSINOPHIL % 4.2 %    BASOPHIL % 0.7 %    NEUTROPHIL # 6.73 1.50 - 7.70 x10^3/uL    LYMPHOCYTE # 2.66 1.00 - 4.80 x10^3/uL    MONOCYTE # 0.87 0.20 - 1.10 x10^3/uL    EOSINOPHIL # 0.45 <=0.50 x10^3/uL    BASOPHIL # <0.10 <=0.20 x10^3/uL    IMMATURE GRANULOCYTE % 0.4 0.0 - 1.0 %    IMMATURE GRANULOCYTE # <0.10 <0.10 x10^3/uL   TYPE AND SCREEN   Result Value Ref Range    UNITS ORDERED 1     ABO/RH(D) O POSITIVE     ANTIBODY SCREEN NEGATIVE     SPECIMEN EXPIRATION DATE 07/28/2023,2359    CROSSMATCH RED CELLS - UNITS , 1 Units   Result Value Ref Range    Coding System [id number]     UNIT NUMBER [id number]     BLOOD COMPONENT TYPE LR RBC, Adsol1, 04710     UNIT DIVISION 00     UNIT DISPENSE STATUS ALLOCATED      TRANSFUSION STATUS OK TO TRANSFUSE     IS CROSSMATCH Electronically Compatible     Product Code [id number]    POC BLOOD GLUCOSE (RESULTS)   Result Value Ref Range    GLUCOSE, POC 108 65 - 125 mg/dl     Radiology:  (images and reports personally reviewed):     Chest x-ray 07/24/23:  Narrative & Impression   FINDINGS:     Lines, tubes and hardware: Enteric tube visualized passing below level of the diaphragm with the tip projecting over the expected location the gastric body.     Lungs and pleura: Stable elevation of the left hemidiaphragm. Probable small left pleural effusion. No pneumothorax.     Heart and mediastinum: The heart size is normal for technique. The mediastinal contours are normal.       Bones: No acute bony abnormality is identified.     IMPRESSION:  1.Appropriately positioned NG tube.  2.Stable elevation of the left hemidiaphragm with stable left basilar opacity representing atelectasis/pleural effusion.     ASSESSMENT:  Patient is a 53 year old female who is being evaluated for a left diaphragmatic hernia that developed following left nephrectomy at [facility] on 06/19/23.    PLAN:    Left diaphragmatic hernia   -OR today, 6/20, for robotic-assisted laparoscopy with diaphragmatic hernia repair, EGD, possible laparotomy    -Consented   -Maintain strict NPO               - Avoid NIV   -mIVFs   -NG tube to low intermittent suction   -Rest of care per primary team   -Thank you for this consult. We will continue to follow.    I independently of the faculty provider spent a total of (15) minutes in direct/indirect care of  this patient including initial evaluation, review of laboratory, radiology, diagnostic studies, review of medical record, order entry and coordination of care.    Lauraine Donning, PA-C

## 2023-07-26 ENCOUNTER — Inpatient Hospital Stay (HOSPITAL_COMMUNITY)

## 2023-07-26 DIAGNOSIS — Z931 Gastrostomy status: Secondary | ICD-10-CM

## 2023-07-26 DIAGNOSIS — R410 Disorientation, unspecified: Secondary | ICD-10-CM

## 2023-07-26 DIAGNOSIS — R4182 Altered mental status, unspecified: Secondary | ICD-10-CM

## 2023-07-26 DIAGNOSIS — Z9889 Other specified postprocedural states: Secondary | ICD-10-CM

## 2023-07-26 DIAGNOSIS — Z85841 Personal history of malignant neoplasm of brain: Secondary | ICD-10-CM

## 2023-07-26 LAB — BLOOD GAS W/ CO-OX, LYTES, LACTATE REFLEX
%FIO2 (ARTERIAL): 60 %
BASE DEFICIT: 3.4 mmol/L — ABNORMAL HIGH (ref 0.0–3.0)
BICARBONATE (ARTERIAL): 22.2 mmol/L (ref 21.0–28.0)
CARBOXYHEMOGLOBIN: 2.1 % (ref <=3.0)
CHLORIDE: 105 mmol/L (ref 98–107)
GLUCOSE: 162 mg/dL — ABNORMAL HIGH (ref 65–125)
HEMATOCRITRT: 39 % (ref 37–50)
HEMOGLOBIN: 13 g/dL (ref 12.0–18.0)
IONIZED CALCIUM: 1.29 mmol/L (ref 1.15–1.33)
LACTATE: 1.9 mmol/L (ref <=1.9)
MET-HEMOGLOBIN: 1 % (ref <=1.5)
O2 SATURATION (ARTERIAL): 94.7 % (ref 94.0–98.0)
O2CT: 17.3 %
OXYHEMOGLOBIN: 94.1 % (ref 90.0–95.0)
PAO2/FIO2 RATIO: 133
PCO2 (ARTERIAL): 44 mmHg (ref 35–45)
PH (ARTERIAL): 7.32 — ABNORMAL LOW (ref 7.35–7.45)
PO2 (ARTERIAL): 80 mmHg — ABNORMAL LOW (ref 83–108)
SODIUM: 138 mmol/L (ref 136–145)
WHOLE BLOOD POTASSIUM: 3.9 mmol/L (ref 3.5–5.1)

## 2023-07-26 LAB — CBC WITH DIFF
BASOPHIL #: <0.1 10*3/uL (ref <=0.20)
BASOPHIL %: 0.1 %
EOSINOPHIL #: <0.1 10*3/uL (ref <=0.50)
EOSINOPHIL %: 0.1 %
HCT: 37 % (ref 34.8–46.0)
HGB: 12.6 g/dL (ref 11.5–16.0)
IMMATURE GRANULOCYTE #: <0.1 10*3/uL (ref <0.10)
IMMATURE GRANULOCYTE %: 0.4 % (ref 0.0–1.0)
LYMPHOCYTE #: 1.08 10*3/uL (ref 1.00–4.80)
LYMPHOCYTE %: 7.5 %
MCH: 33.1 pg — ABNORMAL HIGH (ref 26.0–32.0)
MCHC: 34.1 g/dL (ref 31.0–35.5)
MCV: 97.1 fL (ref 78.0–100.0)
MONOCYTE #: 0.88 10*3/uL (ref 0.20–1.10)
MONOCYTE %: 6.1 %
MPV: 9 fL (ref 8.7–12.5)
NEUTROPHIL #: 12.31 10*3/uL — ABNORMAL HIGH (ref 1.50–7.70)
NEUTROPHIL %: 85.8 %
PLATELETS: 245 10*3/uL (ref 150–400)
RBC: 3.81 10*6/uL — ABNORMAL LOW (ref 3.85–5.22)
RDW-CV: 13.7 % (ref 11.5–15.5)
WBC: 14.4 10*3/uL — ABNORMAL HIGH (ref 3.7–11.0)

## 2023-07-26 LAB — BASIC METABOLIC PANEL
ANION GAP: 16 mmol/L — ABNORMAL HIGH (ref 4–13)
BUN/CREA RATIO: 16 (ref 6–22)
BUN: 15 mg/dL (ref 8–25)
CALCIUM: 9.8 mg/dL (ref 8.6–10.2)
CHLORIDE: 107 mmol/L (ref 96–111)
CO2 TOTAL: 21 mmol/L — ABNORMAL LOW (ref 22–30)
CREATININE: 0.96 mg/dL (ref 0.60–1.05)
ESTIMATED GFR - FEMALE: 70 mL/min/BSA (ref >=60)
GLUCOSE: 150 mg/dL — ABNORMAL HIGH (ref 65–125)
POTASSIUM: 3.9 mmol/L (ref 3.5–5.1)
SODIUM: 144 mmol/L (ref 136–145)

## 2023-07-26 LAB — ECG 12-LEAD
Atrial Rate: 105 {beats}/min
Calculated P Axis: 64 degrees
Calculated R Axis: 92 degrees
Calculated T Axis: -80 degrees
PR Interval: 150 ms
QRS Duration: 74 ms
QT Interval: 368 ms
QTC Calculation: 486 ms
Ventricular rate: 105 {beats}/min

## 2023-07-26 LAB — PT/INR
INR: 1.18 (ref 0.80–1.20)
PROTHROMBIN TIME: 13.7 s (ref 9.2–14.0)

## 2023-07-26 LAB — POC ARTERIAL BLOOD GAS HVI (RESULT)
%FIO2 (ARTERIAL): 51 %
BASE EXCESS (ARTERIAL): -1.5 mmol/L (ref <=1.0)
BICARBONATE (ARTERIAL): 23.8 mmol/L (ref 21.0–28.0)
CARBOXYHEMOGLOBIN: 2.1 % (ref <=3.0)
CHLORIDE: 103 mmol/L (ref 98–107)
GLUCOSE: 140 mg/dL — ABNORMAL HIGH (ref 65–125)
HEMOGLOBIN: 12 g/dL (ref 12.0–18.0)
IONIZED CALCIUM: 1.07 mmol/L — ABNORMAL LOW (ref 1.15–1.33)
LACTATE: 0.9 mmol/L (ref <=1.9)
MET-HEMOGLOBIN: 0.8 % (ref <=1.5)
O2 SATURATION (ARTERIAL): 99.7 % (ref 94.0–98.0)
O2CT: 16.6 %
OXYHEMOGLOBIN: 96.8 % — ABNORMAL HIGH (ref 90.0–95.0)
PAO2/FIO2 RATIO: 263
PCO2 (ARTERIAL): 44 mmHg (ref 35–45)
PH (ARTERIAL): 7.35 (ref 7.35–7.45)
PO2 (ARTERIAL): 134 mmHg — ABNORMAL HIGH (ref 83–108)
SODIUM: 138 mmol/L (ref 136–145)
WHOLE BLOOD POTASSIUM: 3.8 mmol/L (ref 3.5–5.1)

## 2023-07-26 LAB — PTT (PARTIAL THROMBOPLASTIN TIME): APTT: 23.1 s — ABNORMAL LOW (ref 24.3–37.6)

## 2023-07-26 LAB — POC BLOOD GLUCOSE (RESULTS)
GLUCOSE, POC: 157 mg/dL — ABNORMAL HIGH (ref 65–125)
GLUCOSE, POC: 125 mg/dL (ref 65–125)
GLUCOSE, POC: 128 mg/dL — ABNORMAL HIGH (ref 65–125)
GLUCOSE, POC: 106 mg/dL (ref 65–125)
GLUCOSE, POC: 92 mg/dL (ref 65–125)
GLUCOSE, POC: 125 mg/dL (ref 65–125)

## 2023-07-26 LAB — MAGNESIUM: MAGNESIUM: 2 mg/dL (ref 1.8–2.6)

## 2023-07-26 LAB — THYROID STIMULATING HORMONE WITH FREE T4 REFLEX: TSH: 1.229 u[IU]/mL (ref 0.350–4.940)

## 2023-07-26 LAB — PHOSPHORUS: PHOSPHORUS: 5.7 mg/dL — ABNORMAL HIGH (ref 2.4–4.7)

## 2023-07-26 MED ORDER — CALCIUM CHLORIDE 100 MG/ML (10 %) INTRAVENOUS SYRINGE
INJECTION | Freq: Once | INTRAVENOUS | Status: DC | PRN
Start: 2023-07-26 — End: 2023-07-26
  Administered 2023-07-26: 200 mg via INTRAVENOUS
  Administered 2023-07-26 (×8): 100 mg via INTRAVENOUS

## 2023-07-26 MED ORDER — ONDANSETRON HCL (PF) 4 MG/2 ML INJECTION SOLUTION
Freq: Once | INTRAMUSCULAR | Status: DC | PRN
Start: 2023-07-26 — End: 2023-07-26
  Administered 2023-07-26: 4 mg via INTRAVENOUS

## 2023-07-26 MED ORDER — ACETAMINOPHEN 1,000 MG/100 ML (10 MG/ML) INTRAVENOUS SOLUTION
1000.0000 mg | Freq: Four times a day (QID) | INTRAVENOUS | Status: AC | PRN
Start: 2023-07-26 — End: 2023-07-27

## 2023-07-26 MED ORDER — HYDRALAZINE 20 MG/ML INJECTION SOLUTION
INTRAMUSCULAR | Status: AC
Start: 2023-07-26 — End: 2023-07-26
  Administered 2023-07-26: 10 mg via INTRAVENOUS
  Filled 2023-07-26: qty 1

## 2023-07-26 MED ORDER — DEXTROSE 5% IN WATER (D5W) FLUSH BAG - 250 ML
INTRAVENOUS | Status: DC | PRN
Start: 2023-07-26 — End: 2023-07-26

## 2023-07-26 MED ORDER — SODIUM CHLORIDE 0.9 % (FLUSH) INJECTION SYRINGE
2.0000 mL | INJECTION | Freq: Three times a day (TID) | INTRAMUSCULAR | Status: DC
Start: 2023-07-26 — End: 2023-07-26
  Administered 2023-07-26: 0 mL

## 2023-07-26 MED ORDER — SODIUM CHLORIDE 0.9 % INJECTION SOLUTION
40.0000 mg | Freq: Every day | INTRAVENOUS | Status: AC
Start: 2023-07-26 — End: ?
  Administered 2023-07-26 – 2023-07-28 (×3): 40 mg via INTRAVENOUS
  Filled 2023-07-26 (×3): qty 10

## 2023-07-26 MED ORDER — SODIUM CHLORIDE 0.9 % INTRAVENOUS SOLUTION
INTRAVENOUS | Status: DC | PRN
Start: 2023-07-26 — End: 2023-07-26

## 2023-07-26 MED ORDER — HYDROMORPHONE (PF) 0.5 MG/0.5 ML INJECTION SYRINGE
0.2000 mg | INJECTION | INTRAMUSCULAR | Status: AC | PRN
Start: 2023-07-26 — End: ?
  Administered 2023-07-26 – 2023-07-28 (×8): 0.2 mg via INTRAVENOUS
  Filled 2023-07-26 (×8): qty 0.5

## 2023-07-26 MED ORDER — HYDRALAZINE 20 MG/ML INJECTION SOLUTION
10.0000 mg | Freq: Four times a day (QID) | INTRAMUSCULAR | Status: DC | PRN
Start: 2023-07-26 — End: 2023-07-26

## 2023-07-26 MED ORDER — SODIUM CHLORIDE 0.9% FLUSH BAG - 250 ML
INTRAVENOUS | Status: DC | PRN
Start: 2023-07-26 — End: 2023-07-26

## 2023-07-26 MED ORDER — SODIUM CHLORIDE 0.9 % INTRAVENOUS SOLUTION
0.0000 mg/h | INTRAVENOUS | Status: DC
Start: 2023-07-26 — End: 2023-07-27
  Administered 2023-07-26 (×2): 5 mg/h via INTRAVENOUS
  Administered 2023-07-26: 0 mg/h via INTRAVENOUS
  Filled 2023-07-26: qty 20

## 2023-07-26 MED ORDER — HYDRALAZINE 20 MG/ML INJECTION SOLUTION
10.0000 mg | Freq: Four times a day (QID) | INTRAMUSCULAR | Status: DC | PRN
Start: 2023-07-26 — End: 2023-08-03
  Administered 2023-07-26: 10 mg via INTRAVENOUS

## 2023-07-26 MED ORDER — NICARDIPINE 25 MG/10 ML INTRAVENOUS SOLUTION
INTRAVENOUS | Status: AC
Start: 2023-07-26 — End: 2023-07-27
  Filled 2023-07-26: qty 20

## 2023-07-26 MED ORDER — ELECTROLYTE-R (PH 7.4) INTRAVENOUS SOLUTION
INTRAVENOUS | Status: AC
Start: 2023-07-26 — End: ?
  Administered 2023-07-28: 0 mL via INTRAVENOUS

## 2023-07-26 MED ORDER — SUGAMMADEX 100 MG/ML INTRAVENOUS SOLUTION
INTRAVENOUS | Status: AC
Start: 2023-07-26 — End: 2023-07-26
  Filled 2023-07-26: qty 2

## 2023-07-26 MED ORDER — SUGAMMADEX 100 MG/ML INTRAVENOUS SOLUTION
Freq: Once | INTRAVENOUS | Status: DC | PRN
Start: 2023-07-26 — End: 2023-07-26
  Administered 2023-07-26: 200 mg via INTRAVENOUS

## 2023-07-26 MED ORDER — SODIUM CHLORIDE 0.9 % (FLUSH) INJECTION SYRINGE
2.0000 mL | INJECTION | INTRAMUSCULAR | Status: DC | PRN
Start: 2023-07-26 — End: 2023-07-26

## 2023-07-26 MED ORDER — HEPARIN (PORCINE) 5,000 UNIT/ML INJECTION SOLUTION
5000.0000 [IU] | Freq: Three times a day (TID) | INTRAMUSCULAR | Status: AC
Start: 2023-07-26 — End: ?
  Administered 2023-07-26 – 2023-07-28 (×6): 5000 [IU] via SUBCUTANEOUS
  Filled 2023-07-26 (×6): qty 1

## 2023-07-26 NOTE — Nurses Notes (Signed)
 STAT Note:    Stroke page initiated at [phone]. Neurology at bedside. Stroke page downgraded at 0830. Bedside RN updated and assuming patient care.

## 2023-07-26 NOTE — Nurses Notes (Signed)
 Pt admitted to CVICU 14 s/p robotic hernia repair, exploratory laparotomy, and G tube placement. Pt extubated on no GTTs. Pt connected to the monitor and assessed by primary RN. See Flowsheets for VS and assessment. Service notified and at bedside.    Maurilio Ast, RN

## 2023-07-26 NOTE — Anesthesia Postprocedure Evaluation (Signed)
 Anesthesia Post Op Evaluation    Patient: Elizabeth [name]  Procedure(s):  ROBOTIC DIAPHRAGMATIC HERNIA REPAIR  GASTROSCOPY  INSERTION GASTROSTOMY TUBE PERCUTANEOUS    Last Vitals:Temperature: 36.7 C (98.1 F) (07/26/23 1600)  Heart Rate: 99 (07/26/23 1830)  BP (Non-Invasive): (!) 131/90 (07/26/23 1830)  Respiratory Rate: (!) 21 (07/26/23 1830)  SpO2: 93 % (07/26/23 1830)    No notable events documented.      Patient location during evaluation: ICU       Patient participation: complete - patient participated  Level of consciousness: lethargic    Pain management: adequate  Airway patency: patent    Cardiovascular status: acceptable  Respiratory status: acceptable  Hydration status: acceptable  Patient post-procedure temperature: Pt Normothermic

## 2023-07-26 NOTE — Anesthesia OR-ICU Handoff (Signed)
 Anesthesia ICU Transfer of Care  Elizabeth [name] is a 53 y.o. ,female, Weight: 83.2 kg (183 lb 6.8 oz)   had Procedure(s):  ROBOTIC DIAPHRAGMATIC HERNIA REPAIR  GASTROSCOPY  INSERTION GASTROSTOMY TUBE PERCUTANEOUS  performed  07/26/23   Primary Service: Lynwood Brackett, MD    Past Medical History:   Diagnosis Date    Anemia     Cancer (CMS [facility])     brain tumor 8/15 with chemo and radiation    Chest pain, unspecified type 07/17/2023    CPAP (continuous positive airway pressure) dependence     CVA (cerebrovascular accident)     mini during brain surgery per pt    Diabetes mellitus, type 2     Disorder of thyroid     Dyspnea, unspecified type 07/17/2023    Epileptic seizure (CMS HCC)     Essential hypertension     History of kidney surgery 06/2023    Pulmonary embolism 07/16/2023    Sleep apnea       Allergy History as of 07/26/23       ACE INHIBITORS         Noted Status Severity Type Reaction    04/05/21 1524 Teresa Pulling, [place] 09/10/13 Active       Comments: Other reaction(s): Angioedema                   I completed my ICU transfer of care/ Handoff to the ICU receiving personnel during which we discussed :  Access, Airway, All key and critical aspects of case discussed, Analgesia, Antibiotics, Expectation of post procedure, Fluids/Product, Gave opportunity for questions and acknowledgement of understanding, Labs and PMHx    anesthesia risks / alternatives discussed This is for handoff only . For doses and times see MAR  Recommendations: Post-Op ABG   Last Intra-op: >36 C Reversal Agent:sugammadex ([place]) Paralytic:rocuronium  Rhythm: sinus rhythm     Analgesia: acetaminophen , fentanyl  and hydromorphone   Fluids: 3000 mL PONV: dexamethasome and ondansetron    Blood Products: albumin   Airway: 1 attempt   Lines:A-line and PIV  Other: Calcium  Chloride                           Last OR Temp: Temperature: 36.4 C (97.5 F)  ABG:  PH (ARTERIAL)   Date Value Ref Range Status   07/26/2023 7.35 7.35 - 7.45 Final     PH (T)   Date Value  Ref Range Status   06/19/2023 7.31 (L) 7.32 - 7.43 Final     PCO2 (ARTERIAL)   Date Value Ref Range Status   07/26/2023 44 35 - 45 mm/Hg Final     PCO2 (VENOUS)   Date Value Ref Range Status   06/19/2023 39 (L) 41 - 51 mm/Hg Final     PO2 (ARTERIAL)   Date Value Ref Range Status   07/26/2023 134 (H) 83 - 108 mm/Hg Final     PO2 (VENOUS)   Date Value Ref Range Status   06/19/2023 131 35 - 50 mm/Hg Final     SODIUM   Date Value Ref Range Status   07/26/2023 138 136 - 145 mmol/L Final     POTASSIUM   Date Value Ref Range Status   07/25/2023 3.9 3.5 - 5.1 mmol/L Final     KETONES   Date Value Ref Range Status   07/23/2023 Negative Negative mg/dL Final     WHOLE BLOOD POTASSIUM   Date Value Ref Range Status  07/26/2023 3.8 3.5 - 5.1 mmol/L Final     CHLORIDE   Date Value Ref Range Status   07/26/2023 103 98 - 107 mmol/L Final     CALCIUM    Date Value Ref Range Status   07/25/2023 9.3 8.6 - 10.2 mg/dL Final     Comment:     Gadolinium-containing contrast can interfere with calcium  measurement.       Calculated P Axis   Date Value Ref Range Status   07/24/2023 49 degrees Final     Calculated R Axis   Date Value Ref Range Status   07/24/2023 73 degrees Final     Calculated T Axis   Date Value Ref Range Status   07/24/2023 71 degrees Final     IONIZED CALCIUM    Date Value Ref Range Status   07/26/2023 1.07 (L) 1.15 - 1.33 mmol/L Final     LACTATE   Date Value Ref Range Status   07/26/2023 0.9 <=1.9 mmol/L Final     HEMOGLOBIN   Date Value Ref Range Status   07/26/2023 12.0 12.0 - 18.0 g/dL Final     OXYHEMOGLOBIN   Date Value Ref Range Status   07/26/2023 96.8 (H) 90.0 - 95.0 % Final     CARBOXYHEMOGLOBIN   Date Value Ref Range Status   07/26/2023 2.1 <=3.0 % Final     MET-HEMOGLOBIN   Date Value Ref Range Status   07/26/2023 0.8 <=1.5 % Final     BASE EXCESS (ARTERIAL)   Date Value Ref Range Status   07/26/2023 -1.5 -3.0 - 1.0 mmol/L Final     BASE DEFICIT   Date Value Ref Range Status   06/19/2023 6.2 (H) 0.0 - 3.0 mmol/L  Final   06/19/2023 1.0 0.0 - 3.0 mmol/L Final     BICARBONATE (ARTERIAL)   Date Value Ref Range Status   07/26/2023 23.8 21.0 - 28.0 mmol/L Final     BICARBONATE (VENOUS)   Date Value Ref Range Status   06/19/2023 20.1 (L) 22.0 - 29.0 mmol/L Final     TEMPERATURE, COMP   Date Value Ref Range Status   06/19/2023 37.0 15.0 - 40.0 C Final     %FIO2 (VENOUS)   Date Value Ref Range Status   06/19/2023 61.0 % Final     Airway:* No LDAs found *

## 2023-07-26 NOTE — Consults (Signed)
 [place]  [facility]   Stroke Consult Note    Elizabeth [name], Elizabeth [name], 53 y.o. female  Date of Admission:  07/24/2023  Date of Birth:  [birth year]    PCP: No Pcp  Consult Requested By: Fredonia Lis, MD [phone]     Information Obtained from: patient, caregiver, and history reviewed via medical record  Chief complaint: No chief complaint on file.       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 Patient underwent procedure last night and reportedly recovered from surgery well and was alert and oriented x4. She was also noted to be oriented at 0400. At about 0800, she was noted to have garbled speech and not knowing her age or what year it was, and therefore a stroke alert was activated. Upon my arrival, patient had stuttering speech but was oriented x4. She was able to report her past medical history with no issues. She reports having pain at surgical site. NIHSS was 0.     Stroke alert was downgraded.     Patient is on Phenytoin  300 mg BID, and is currently on fosphenytoin  200 mg IV TID.     Admission Source:  Transfer from another hospital -  [place]  Last Known Well Date/Time: 07/26/2023   04:00   NIH Stroke Scale on arrival: 0  ICH Score: N/A    Baseline functional status prior to stroke and/or treatment (mRS): 1 -The patient has no significant disability; able to carry out all ADL's.     Was this a Stroke page:  Yes, Patient was  Inpatient.  Team at Bedside: 07/26/2023   08:19.  Time symptoms recognized- 07/26/2023   08:00. Time stroke page was called 07/26/2023   08:15. Was the stroke downgraded: YES, reason for downgrade  NIH stroke scale 0 within 180 mins of last known well. Candidate for thrombolytic-  No, thrombolytic not  given. INR candidate?   Not an IR candidate.   NIHSS <6   .    MEDICAL HISTORY:      Past Medical History:   Diagnosis Date    Anemia     Cancer (CMS HCC)     brain tumor 8/15 with chemo and radiation    Chest pain, unspecified type 07/17/2023    CPAP (continuous positive airway pressure) dependence     CVA (cerebrovascular accident)     mini during brain surgery per pt    Diabetes mellitus, type 2     Disorder of thyroid     Dyspnea, unspecified type 07/17/2023    Epileptic seizure (CMS HCC)     Essential hypertension     History of kidney surgery 06/2023    Pulmonary embolism 07/16/2023    Sleep apnea          Past Surgical History:   Procedure Laterality Date    BRAIN SURGERY      HX CHOLECYSTECTOMY           Medications Prior to Admission       Prescriptions    apixaban  (ELIQUIS ) 5 mg Oral Tablet    Take 2 Tablets (10 mg total) by mouth Twice daily for 11 doses    apixaban  (ELIQUIS ) 5 mg Oral  Tablet    Take 1 Tablet (5 mg total) by mouth Twice daily for 30 days    atorvastatin  (LIPITOR) 20 mg Oral Tablet    Take 1 Tablet (20 mg total) by mouth Every night    ergocalciferol, vitamin D2, (DRISDOL) 1,250 mcg (50,000 unit) Oral Capsule    Take 1 Capsule (50,000 Units total) by mouth Every Sunday    escitalopram  oxalate (LEXAPRO ) 20 mg Oral Tablet    Take 1 Tablet (20 mg total) by mouth Daily    famotidine  (PEPCID ) 20 mg Oral Tablet    Take 1 Tablet (20 mg total) by mouth Daily    FREESTYLE LANCETS 28 gauge Misc    TEST ONCE DAILY    FREESTYLE LITE METER Kit    EVERY DAY    FREESTYLE LITE STRIPS Strip    TEST EVERY DAY    hydroCHLOROthiazide  (HYDRODIURIL ) 25 mg Oral Tablet [Paused]    Take 1 Tablet (25 mg total) by mouth Every morning    levothyroxine  (SYNTHROID ) 25 mcg Oral Tablet    TAKE 1 TABLET ONCE A DAY IN THE MORNING 30 MINUTES BEFORE MEALS OR ANY OTHER MEDICATIONS    losartan  (COZAAR ) 100 mg Oral Tablet    Take 1 Tablet (100 mg total) by mouth Daily    magnesium  Oxide 420 mg Oral Tablet    Take 1 Tablet (420  mg total) by mouth Daily    methocarbamoL  (ROBAXIN ) 500 mg Oral Tablet    Take 1 Tablet (500 mg total) by mouth Three times a day as needed    Patient not taking:  Reported on 07/24/2023    phenytoin  sodium extended release (DILANTIN ) 100 mg Oral Capsule    TAKE 3 CAPSULES BY MOUTH TWICE A DAY    semaglutide  (OZEMPIC ) 0.25 mg or 0.5 mg(2 mg/1.5 mL) Subcutaneous Pen Injector    Inject 0.5 mg under the skin Every 7 days          Allergies[1]  Social History     Tobacco Use    Smoking status: Never    Smokeless tobacco: Never   Substance Use Topics    Alcohol use: Never     Family Medical History:       Problem Relation (Age of Onset)    Arthritis-osteo Mother    Breast Cancer Paternal Grandmother    Heart Disease Father    Hypertension (High Blood Pressure) Mother    No Known Problems Sister, Brother, Maternal Grandmother, Maternal Grandfather, Paternal Grandfather, Daughter, Son, Maternal Aunt, Maternal Uncle, Paternal Aunt, Paternal Uncle, Other               REVIEW OF SYSTEMS:      ROS: Other than ROS in the HPI, all other systems were negative.    EXAMINATION:      Temperature: 36.7 C (98.1 F)  Heart Rate: (!) 104  BP (Non-Invasive): 136/65  Respiratory Rate: 16  SpO2: 98 %    Physical Exam:  General: appears chronically ill and acutely ill  HENT:Head atraumatic and normocephalic  Neck: no thyromegaly or lymphadenopathy  Lungs: Breathing somewhat labored, saturating 91% on 5 L of O2 vis NC  Cardiovascular: regular rate and rhythm on the monitor  Extremities: No cyanosis or deformity  Glasgow: Eye opening:  4 spontaneous, Verbal response:  5 oriented, Best motor response:  6 obeys commands  Mental status:  Level of Consciousness: alert  Orientations: Alert and oriented x 3  Memory: Registration, Recall, and Following of commands is  normal  Attention: Attention and Concentration are normal  Knowledge: Good  Language: Normal  Speech: Stuttering but not slurred   Cranial nerves:   CN2: Left visual field normal and  Right visual field normal  CN 3,4,6: EOMI, PERRL  CN 5: Facial sensation intact  CN 7: Face symmetrical  CN 8: Hearing grossly intact  CN 9,10: Palate symmetric  CN 11: Sternocleidomastoid and Trapezius have normal strength.  CN 12: Tongue normal with no fasiculations or deviation  Gait, Coordination, and Reflexes:   Gait: Could not assess due to recent surgery   Coordination: Coordination is normal without tremor  Muscle tone: WNL    Motor:  Arm Right Left Leg Right Left   Deltoid 5/5 5/5 Iliopsoas 5/5 5/5   Biceps 5/5 5/5 Quads 5/5 5/5   Triceps 5/5 5/5 Hamstrings 5/5 5/5   Wrist Extension 5/5 5/5 Ankle Dorsi Flexion 5/5 5/5   Wrist Flexion 5/5 5/5 Ankle Plantar Flexion 5/5 5/5   Hand Grip 5/5 5/5   5/5        5/5        Reflexes:   RJ BJ TJ KJ AJ Plantars Hoffman's   Right 2+ 2+ 2+ 2+ 2+ Downgoing Not present   Left 2+ 2+ 2+ 2+ 2+ Downgoing Not present     Sensory: Sensory exam in the upper and lower extremities is symmetrical to light ouch bilaterally     LABS  IMAGING  OTHER REPORTS:     Labs:    I have reviewed all lab results.  Lab Results Today:    Results for orders placed or performed during the hospital encounter of 07/24/23 (from the past 24 hours)   POC BLOOD GLUCOSE (RESULTS)   Result Value Ref Range    GLUCOSE, POC 108 65 - 125 mg/dl   POC BLOOD GLUCOSE (RESULTS)   Result Value Ref Range    GLUCOSE, POC 113 65 - 125 mg/dl   POC ARTERIAL BLOOD GAS HVI (RESULT)   Result Value Ref Range    %FIO2 (ARTERIAL) 49 %    PH (ARTERIAL) 7.32 (L) 7.35 - 7.45    PCO2 (ARTERIAL) 49 (H) 35 - 45 mm/Hg    PO2 (ARTERIAL) 96 83 - 108 mm/Hg    BASE EXCESS (ARTERIAL) -1.4 -3.0 - 1.0 mmol/L    BICARBONATE (ARTERIAL) 23.8 21.0 - 28.0 mmol/L    SODIUM 138 136 - 145 mmol/L    WHOLE BLOOD POTASSIUM 4.0 3.5 - 5.1 mmol/L    CHLORIDE 102 98 - 107 mmol/L    IONIZED CALCIUM  1.10 (L) 1.15 - 1.33 mmol/L    GLUCOSE 166 (H) 65 - 125 mg/dL    LACTATE 1.3 <=8.0 mmol/L    HEMOGLOBIN 12.9 12.0 - 18.0 g/dL    OXYHEMOGLOBIN 03.4 (H) 90.0 -  95.0 %    CARBOXYHEMOGLOBIN 2.2 <=3.0 %    MET-HEMOGLOBIN <0.7 <=1.5 %    O2CT 17.6 N/A %    O2 SATURATION (ARTERIAL) 98.9 94.0 - 98.0 %    PAO2/FIO2 RATIO 196 N/A   POC ARTERIAL BLOOD GAS HVI (RESULT)   Result Value Ref Range    %FIO2 (ARTERIAL) 51 %    PH (ARTERIAL) 7.35 7.35 - 7.45    PCO2 (ARTERIAL) 44 35 - 45 mm/Hg    PO2 (ARTERIAL) 134 (H) 83 - 108 mm/Hg    BASE EXCESS (ARTERIAL) -1.5 -3.0 - 1.0 mmol/L    BICARBONATE (ARTERIAL) 23.8 21.0 - 28.0 mmol/L    SODIUM 138  136 - 145 mmol/L    WHOLE BLOOD POTASSIUM 3.8 3.5 - 5.1 mmol/L    CHLORIDE 103 98 - 107 mmol/L    IONIZED CALCIUM  1.07 (L) 1.15 - 1.33 mmol/L    GLUCOSE 140 (H) 65 - 125 mg/dL    LACTATE 0.9 <=8.0 mmol/L    HEMOGLOBIN 12.0 12.0 - 18.0 g/dL    OXYHEMOGLOBIN 03.1 (H) 90.0 - 95.0 %    CARBOXYHEMOGLOBIN 2.1 <=3.0 %    MET-HEMOGLOBIN 0.8 <=1.5 %    O2CT 16.6 N/A %    O2 SATURATION (ARTERIAL) 99.7 94.0 - 98.0 %    PAO2/FIO2 RATIO 263 N/A   BASIC METABOLIC PANEL   Result Value Ref Range    SODIUM 144 136 - 145 mmol/L    POTASSIUM 3.9 3.5 - 5.1 mmol/L    CHLORIDE 107 96 - 111 mmol/L    CO2 TOTAL 21 (L) 22 - 30 mmol/L    ANION GAP 16 (H) 4 - 13 mmol/L    CALCIUM  9.8 8.6 - 10.2 mg/dL    GLUCOSE 849 (H) 65 - 125 mg/dL    BUN 15 8 - 25 mg/dL    CREATININE 9.03 9.39 - 1.05 mg/dL    BUN/CREA RATIO 16 6 - 22    ESTIMATED GFR - FEMALE 70 >=60 mL/min/BSA   MAGNESIUM    Result Value Ref Range    MAGNESIUM  2.0 1.8 - 2.6 mg/dL   PHOSPHORUS   Result Value Ref Range    PHOSPHORUS 5.7 (H) 2.4 - 4.7 mg/dL   PT/INR   Result Value Ref Range    PROTHROMBIN TIME 13.7 9.2 - 14.0 seconds    INR 1.18 0.80 - 1.20   PTT (PARTIAL THROMBOPLASTIN TIME)   Result Value Ref Range    APTT 23.1 (L) 24.3 - 37.6 seconds   BLOOD GAS W/ CO-OX, LYTES, LACTATE REFLEX Arterial   Result Value Ref Range    %FIO2 (ARTERIAL) 60 %    PH (ARTERIAL) 7.32 (L) 7.35 - 7.45    PCO2 (ARTERIAL) 44 35 - 45 mm/Hg    PO2 (ARTERIAL) 80 (L) 83 - 108 mm/Hg    BICARBONATE (ARTERIAL) 22.2 21.0 - 28.0 mmol/L    BASE  DEFICIT 3.4 (H) 0.0 - 3.0 mmol/L    PAO2/FIO2 RATIO 133     O2 SATURATION (ARTERIAL) 94.7 94.0 - 98.0 %    HEMOGLOBIN 13.0 12.0 - 18.0 g/dL    HEMATOCRITRT 39 37 - 50 %    OXYHEMOGLOBIN 94.1 90.0 - 95.0 %    CARBOXYHEMOGLOBIN 2.1 <=3.0 %    MET-HEMOGLOBIN 1.0 <=1.5 %    O2CT 17.3 %    SODIUM 138 136 - 145 mmol/L    HEMOLYSIS None None, Mild    WHOLE BLOOD POTASSIUM 3.9 3.5 - 5.1 mmol/L    CHLORIDE 105 98 - 107 mmol/L    IONIZED CALCIUM  1.29 1.15 - 1.33 mmol/L    GLUCOSE 162 (H) 65 - 125 mg/dL    LACTATE 1.9 <=8.0 mmol/L   CBC WITH DIFF   Result Value Ref Range    WBC 14.4 (H) 3.7 - 11.0 x10^3/uL    RBC 3.81 (L) 3.85 - 5.22 x10^6/uL    HGB 12.6 11.5 - 16.0 g/dL    HCT 62.9 65.1 - 53.9 %    MCV 97.1 78.0 - 100.0 fL    MCH 33.1 (H) 26.0 - 32.0 pg    MCHC 34.1 31.0 - 35.5 g/dL    RDW-CV 86.2 88.4 -  15.5 %    PLATELETS 245 150 - 400 x10^3/uL    MPV 9.0 8.7 - 12.5 fL    NEUTROPHIL % 85.8 %    LYMPHOCYTE % 7.5 %    MONOCYTE % 6.1 %    EOSINOPHIL % 0.1 %    BASOPHIL % 0.1 %    NEUTROPHIL # 12.31 (H) 1.50 - 7.70 x10^3/uL    LYMPHOCYTE # 1.08 1.00 - 4.80 x10^3/uL    MONOCYTE # 0.88 0.20 - 1.10 x10^3/uL    EOSINOPHIL # <0.10 <=0.50 x10^3/uL    BASOPHIL # <0.10 <=0.20 x10^3/uL    IMMATURE GRANULOCYTE % 0.4 0.0 - 1.0 %    IMMATURE GRANULOCYTE # <0.10 <0.10 x10^3/uL   ECG 12-LEAD   Result Value Ref Range    Ventricular rate 105 BPM    Atrial Rate 105 BPM    PR Interval 150 ms    QRS Duration 74 ms    QT Interval 368 ms    QTC Calculation 486 ms    Calculated P Axis 64 degrees    Calculated R Axis 92 degrees    Calculated T Axis -80 degrees   POC BLOOD GLUCOSE (RESULTS)   Result Value Ref Range    GLUCOSE, POC 157 (H) 65 - 125 mg/dl   THYROID STIMULATING HORMONE WITH FREE T4 REFLEX   Result Value Ref Range    TSH 1.229 0.350 - 4.940 uIU/mL   POC BLOOD GLUCOSE (RESULTS)   Result Value Ref Range    GLUCOSE, POC 125 65 - 125 mg/dl   POC BLOOD GLUCOSE (RESULTS)   Result Value Ref Range    GLUCOSE, POC 128 (H) 65 - 125 mg/dl        Review of reports and notes reveal:     I have reviewed the following imaging reports:    MRI Brain done in February 2025:  STABLE CONTRAST-ENHANCED BRAIN MRI. NO EVIDENCE OF RESIDUAL/RECURRENT DISEASE.  NO ACUTE INTRACRANIAL ABNORMALITY.    I have reviewed these other reports & notes:    No previous reports of EEG.     Independent Interpretation of images or specimens:  1. MRI of the Brain performed on 03/09/2023 on [facility] PACS and it shows Post operative changes in the left parietal lobe with radiation changes noted as well with no change when compared with previous imaging. .     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 Stroke alert was activated due to confusion however upon examination NIHSS was 0 with only stuttering speech noted. Patient has history of seizures but has been getting her home seizure medication (fosphenytoin  200 mg TID in place of Phenytoin  300 mg BID). Stroke alert was downgraded. This could be hospital acquired delirium vs toxic metabolic encephalopathy.     Active Hospital Problems    Diagnosis    Primary Problem: Diaphragmatic hernia        NA    RECOMMENDATIONS:      Transient confusion     -Consider metabolic/toxic work-up, including ABG/VBG, and infectious work-up  -Consider EEG monitoring if patient has similar episodes during hospitalization as she has history of structural brain abnormality and seizures.         DVT/PE Prophylaxis - Per Primary Team    Thank you for your consult  and allowing us  to participate in the patient's care, please page Neurology/Stroke with any questions or concerns.    Code Status this admission:  Full Code  Palliative/Supportive Care consulted?  no  Hospice Consulted?  Not applicable    Neurology Related Comorbid Conditions  Severe Brain Conditions: NA  Coma less  than 8: Coma -Not applicable  Loss of Consciousness: no  Encephalopathy Yes Hypoxic  Encephalopathy Treatment: ICU monitoring and Antiseizure medications   Stroke: Not applicable  Craniectomy: no  Seizure: Seizure-[place]  Other  Respiratory Conditions: Acute Respiratory failure unspecified,  Vent on admission NA(date)  Renal Failure: No  Acute Blood Loss Anemia: No  N/A  Coagulopathy: Not applicable  Acid-Base Disorders: Acidotic Metabolic acidosis  Treatment: ICU monitoring  N/A  Hemiplegia/Hemiparesis/Paraplegia: No   Fatigue/Debility: No Weakness   Muscle Wasting and atrophy (specify location): NA      Delmus Grey, MD - 07/26/2023  [place]  [facility]      I saw and examined the patient.  I reviewed the resident's note.  I saw patient his morning and she was answering questions appropriately but lethargic and slow in responses. She could follow commands appropriately and no focal weakness noted. Would recommend a CT noncontrasted head CT. Please call us  once completed.   Ricardo Darter, MD     Addendum 6.22.25:  CT brain wo 6.21.25:personally reviewed    IMPRESSION:  No acute intracranial hemorrhage or evidence of acute transcortical cerebral infarction. Similar postoperative changes and confluent white matter hypoattenuation. If there is clinical concern for residual or recurrent malignant disease, or new intracranial metastatic disease, recommend further evaluation with contrast-enhanced MRI of the brain.            [1]   Allergies  Allergen Reactions    Ace Inhibitors      Other reaction(s): Angioedema

## 2023-07-26 NOTE — Brief Op Note (Signed)
 [place]  [facility]                                                     BRIEF OPERATIVE NOTE    Patient Name: Elizabeth [name], Elizabeth [name] Number: [id number]  Date of Service: July 25, 2023   Date of Birth: [birth year]      Pre-Operative Diagnosis: diaphragm hernia   Post-Operative Diagnosis: diaphragm hernia  Procedure(s)/Description:  Robotic diaphragm hernia repair, gastropexy, PEG tube placement  Attending Surgeon: Dr. [place], Dr. Elidia  Fellow/Resident(s): Ethyl Sanes  Anesthesia Type: General  Estimated Blood Loss: 200 ml  Blood Given: None: None  Fluids Given: see anesthesia record  Complications (not routinely expected or not inherent to difficulty/nature of procedure): None  Drains: #10 Flat JP  19Fr Blake Drain  Specimens/ Cultures: None  Findings/Complexity (inherent to the procedure performed): Spleen adhesive to posterior aspect of defect         Disposition: PACU - hemodynamically stable.  Condition: stable     Characteristic Event (routinely expected or inherent to the difficulty/nature of the procedure): None  Did the use of current and/or prior Anticoagulants impact the outcome of the case? No  Wound Class: Clean Contaminated Wounds -Respiratory, GI, Genital, or Urinary    Ethyl Sanes, MD

## 2023-07-26 NOTE — Progress Notes (Signed)
 [facility]  Thoracic Surgery   Progress Note      Ellen, Goris  Date of Admission:  07/24/2023  Date of Birth:  [birth year]    Hospital Day:  LOS: 2 days   Date of Service:  07/26/2023    Subjective   OR last night for robotic-assisted diaphragm hernia repair, gastropexy, and PEG placement. Patient resting in bed this morning.    Outputs:  Abdominal JP drain: 15cc  Left chest tube: 90cc  PEG: 200cc    Current Diet:    MNT PROTOCOL FOR DIETITIAN  DIET NPO - NOW STRICT    Current Medications:  acetaminophen  (OFIRMEV ) 1,000 mg (10 mg/mL) IV 100 mL (tot vol), 1,000 mg, Intravenous, Q6H PRN  Correction/SSIP insulin  lispro 100 units/mL injection, 2-9 Units, Subcutaneous, 4x/day AC  D5W 250 mL flush bag, , Intravenous, Q15 Min PRN  dextrose  (GLUTOSE) 40% oral gel, 15 g, Oral, Q15 Min PRN  dextrose  50% (0.5 g/mL) injection - syringe, 12.5 g, Intravenous, Q15 Min PRN  electrolyte-R pH 7.4 (NORMOSOL-R pH 7.4) premix infusion, , Intravenous, Continuous  fosphenytoin  (CEREBYX ) 200 mg in NS 50 mL IVPB, 200 mg, Intravenous, Q8H  glucagon  injection 1 mg, 1 mg, IntraMUSCULAR, Once PRN  hydrALAZINE  (APRESOLINE ) injection 10 mg, 10 mg, Intravenous, Q6H PRN  HYDROmorphone  (DILAUDID ) 0.5 mg/0.5 mL injection, 0.2 mg, Intravenous, Q4H PRN  niCARdipine  (CARDENE ) 2.5 mg/mL injection ---Cabinet Override, , ,   niCARdipine  (CARDENE ) 50 mg in NS 250 mL (tot vol) infusion, 0-15 mg/hr, Intravenous, Continuous  NS 250 mL flush bag, , Intravenous, Q15 Min PRN  NS bolus infusion 40 mL, 40 mL, Intravenous, Once PRN  NS flush syringe, 2-6 mL, Intracatheter, Q8HRS  NS flush syringe, 2-6 mL, Intracatheter, Q1 MIN PRN  pantoprazole  (PROTONIX ) 40 mg in NS 10 mL injection, 40 mg, Intravenous, Daily      Vital Signs:  Temp (24hrs) Max:37 C (98.6 F)  Temperature: 36.7 C (98.1 F)  BP (Non-Invasive): 129/85  MAP (Non-Invasive): 99 mmHG  Heart Rate: (!) 102  Respiratory Rate: 19  SpO2: 96 %  Base (Admission) Weight:  Weight: 83.2 kg (183 lb  6.8 oz)  Weight:  Weight: 91.4 kg (201 lb 8 oz)    Physical Exam:  General: no acute distress, appears stated age  Head: normocephalic, atraumatic  Chest: clear, normal excursion, left chest tube in place with serosanguinous output   Heart: RRR  Abdomen: soft, non-distended, abdominal JP drain in place with serosanguinous output  Extremeties: no edema, cyanosis  Skin: normal turgor, non-icteric  Neuro: no focal neurological deficit    Labs:  I have reviewed all pertinent lab results as below:  Results for orders placed or performed during the hospital encounter of 07/24/23 (from the past 24 hours)   POC BLOOD GLUCOSE (RESULTS)   Result Value Ref Range    GLUCOSE, POC 113 65 - 125 mg/dl   POC ARTERIAL BLOOD GAS HVI (RESULT)   Result Value Ref Range    %FIO2 (ARTERIAL) 49 %    PH (ARTERIAL) 7.32 (L) 7.35 - 7.45    PCO2 (ARTERIAL) 49 (H) 35 - 45 mm/Hg    PO2 (ARTERIAL) 96 83 - 108 mm/Hg    BASE EXCESS (ARTERIAL) -1.4 -3.0 - 1.0 mmol/L    BICARBONATE (ARTERIAL) 23.8 21.0 - 28.0 mmol/L    SODIUM 138 136 - 145 mmol/L    WHOLE BLOOD POTASSIUM 4.0 3.5 - 5.1 mmol/L    CHLORIDE 102 98 - 107 mmol/L  IONIZED CALCIUM  1.10 (L) 1.15 - 1.33 mmol/L    GLUCOSE 166 (H) 65 - 125 mg/dL    LACTATE 1.3 <=8.0 mmol/L    HEMOGLOBIN 12.9 12.0 - 18.0 g/dL    OXYHEMOGLOBIN 03.4 (H) 90.0 - 95.0 %    CARBOXYHEMOGLOBIN 2.2 <=3.0 %    MET-HEMOGLOBIN <0.7 <=1.5 %    O2CT 17.6 N/A %    O2 SATURATION (ARTERIAL) 98.9 94.0 - 98.0 %    PAO2/FIO2 RATIO 196 N/A   POC ARTERIAL BLOOD GAS HVI (RESULT)   Result Value Ref Range    %FIO2 (ARTERIAL) 51 %    PH (ARTERIAL) 7.35 7.35 - 7.45    PCO2 (ARTERIAL) 44 35 - 45 mm/Hg    PO2 (ARTERIAL) 134 (H) 83 - 108 mm/Hg    BASE EXCESS (ARTERIAL) -1.5 -3.0 - 1.0 mmol/L    BICARBONATE (ARTERIAL) 23.8 21.0 - 28.0 mmol/L    SODIUM 138 136 - 145 mmol/L    WHOLE BLOOD POTASSIUM 3.8 3.5 - 5.1 mmol/L    CHLORIDE 103 98 - 107 mmol/L    IONIZED CALCIUM  1.07 (L) 1.15 - 1.33 mmol/L    GLUCOSE 140 (H) 65 - 125 mg/dL    LACTATE 0.9  <=8.0 mmol/L    HEMOGLOBIN 12.0 12.0 - 18.0 g/dL    OXYHEMOGLOBIN 03.1 (H) 90.0 - 95.0 %    CARBOXYHEMOGLOBIN 2.1 <=3.0 %    MET-HEMOGLOBIN 0.8 <=1.5 %    O2CT 16.6 N/A %    O2 SATURATION (ARTERIAL) 99.7 94.0 - 98.0 %    PAO2/FIO2 RATIO 263 N/A   BASIC METABOLIC PANEL   Result Value Ref Range    SODIUM 144 136 - 145 mmol/L    POTASSIUM 3.9 3.5 - 5.1 mmol/L    CHLORIDE 107 96 - 111 mmol/L    CO2 TOTAL 21 (L) 22 - 30 mmol/L    ANION GAP 16 (H) 4 - 13 mmol/L    CALCIUM  9.8 8.6 - 10.2 mg/dL    GLUCOSE 849 (H) 65 - 125 mg/dL    BUN 15 8 - 25 mg/dL    CREATININE 9.03 9.39 - 1.05 mg/dL    BUN/CREA RATIO 16 6 - 22    ESTIMATED GFR - FEMALE 70 >=60 mL/min/BSA   MAGNESIUM    Result Value Ref Range    MAGNESIUM  2.0 1.8 - 2.6 mg/dL   PHOSPHORUS   Result Value Ref Range    PHOSPHORUS 5.7 (H) 2.4 - 4.7 mg/dL   PT/INR   Result Value Ref Range    PROTHROMBIN TIME 13.7 9.2 - 14.0 seconds    INR 1.18 0.80 - 1.20   PTT (PARTIAL THROMBOPLASTIN TIME)   Result Value Ref Range    APTT 23.1 (L) 24.3 - 37.6 seconds   BLOOD GAS W/ CO-OX, LYTES, LACTATE REFLEX Arterial   Result Value Ref Range    %FIO2 (ARTERIAL) 60 %    PH (ARTERIAL) 7.32 (L) 7.35 - 7.45    PCO2 (ARTERIAL) 44 35 - 45 mm/Hg    PO2 (ARTERIAL) 80 (L) 83 - 108 mm/Hg    BICARBONATE (ARTERIAL) 22.2 21.0 - 28.0 mmol/L    BASE DEFICIT 3.4 (H) 0.0 - 3.0 mmol/L    PAO2/FIO2 RATIO 133     O2 SATURATION (ARTERIAL) 94.7 94.0 - 98.0 %    HEMOGLOBIN 13.0 12.0 - 18.0 g/dL    HEMATOCRITRT 39 37 - 50 %    OXYHEMOGLOBIN 94.1 90.0 - 95.0 %    CARBOXYHEMOGLOBIN 2.1 <=3.0 %  MET-HEMOGLOBIN 1.0 <=1.5 %    O2CT 17.3 %    SODIUM 138 136 - 145 mmol/L    HEMOLYSIS None None, Mild    WHOLE BLOOD POTASSIUM 3.9 3.5 - 5.1 mmol/L    CHLORIDE 105 98 - 107 mmol/L    IONIZED CALCIUM  1.29 1.15 - 1.33 mmol/L    GLUCOSE 162 (H) 65 - 125 mg/dL    LACTATE 1.9 <=8.0 mmol/L   CBC WITH DIFF   Result Value Ref Range    WBC 14.4 (H) 3.7 - 11.0 x10^3/uL    RBC 3.81 (L) 3.85 - 5.22 x10^6/uL    HGB 12.6 11.5 - 16.0 g/dL     HCT 62.9 65.1 - 53.9 %    MCV 97.1 78.0 - 100.0 fL    MCH 33.1 (H) 26.0 - 32.0 pg    MCHC 34.1 31.0 - 35.5 g/dL    RDW-CV 86.2 88.4 - 84.4 %    PLATELETS 245 150 - 400 x10^3/uL    MPV 9.0 8.7 - 12.5 fL    NEUTROPHIL % 85.8 %    LYMPHOCYTE % 7.5 %    MONOCYTE % 6.1 %    EOSINOPHIL % 0.1 %    BASOPHIL % 0.1 %    NEUTROPHIL # 12.31 (H) 1.50 - 7.70 x10^3/uL    LYMPHOCYTE # 1.08 1.00 - 4.80 x10^3/uL    MONOCYTE # 0.88 0.20 - 1.10 x10^3/uL    EOSINOPHIL # <0.10 <=0.50 x10^3/uL    BASOPHIL # <0.10 <=0.20 x10^3/uL    IMMATURE GRANULOCYTE % 0.4 0.0 - 1.0 %    IMMATURE GRANULOCYTE # <0.10 <0.10 x10^3/uL   ECG 12-LEAD   Result Value Ref Range    Ventricular rate 105 BPM    Atrial Rate 105 BPM    PR Interval 150 ms    QRS Duration 74 ms    QT Interval 368 ms    QTC Calculation 486 ms    Calculated P Axis 64 degrees    Calculated R Axis 92 degrees    Calculated T Axis -80 degrees   POC BLOOD GLUCOSE (RESULTS)   Result Value Ref Range    GLUCOSE, POC 157 (H) 65 - 125 mg/dl   THYROID  STIMULATING HORMONE WITH FREE T4 REFLEX   Result Value Ref Range    TSH 1.229 0.350 - 4.940 uIU/mL   POC BLOOD GLUCOSE (RESULTS)   Result Value Ref Range    GLUCOSE, POC 125 65 - 125 mg/dl   POC BLOOD GLUCOSE (RESULTS)   Result Value Ref Range    GLUCOSE, POC 128 (H) 65 - 125 mg/dl     Radiology:  (images and reports personally reviewed):    Results for orders placed or performed during the hospital encounter of 07/24/23 (from the past 24 hours)   XR CHEST AP MOBILE     Status: None    Narrative    Talon Stagliano  Female, 53 years old.    XR AP MOBILE CHEST performed on 07/26/2023 2:52 AM.    REASON FOR EXAM:  post-op    TECHNIQUE: 1 views/1 images submitted for interpretation.    COMPARISON:  07/24/2023    FINDINGS:  Enteric tube has been removed. Interval placement of left basilar chest tube. The cardiac silhouette is unchanged. Lung volumes are low producing interstitial prominence. The left hemidiaphragm now rests below the right. No dense lobar  consolidation. No sizable pleural effusion or pneumothorax.      Impression    No sizable  left pleural effusion or pneumothorax with chest tube in place.       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 OR last night for robotic-assisted diaphragm hernia repair, gastropexy, and PEG placement.    PLAN:    Left diaphragmatic hernia s/p robotic-assisted diaphragm hernia repair, gastropexy, and PEG placement on 6/20   -Maintain NPO today   -Maintain PEG to gravity today   -mIVFs   -Abdominal JP drain to bulb suction   -Left chest tube to -20 suction   -Please keep heparin  drip on hold for today   -AM labs and CXR    Rest of care per CVICU, appreciate assistance    I independently of the faculty provider spent a total of (15) minutes in direct/indirect care of this patient including initial evaluation, review of laboratory, radiology, diagnostic studies, review of medical record, order entry and coordination of care.      Damien Moose, PA-C          I both saw and examined the patient 07/26/2023 in concert with Damien Moose, PA-C and the resident team. Any pertinent interval imaging studies and labs were personally reviewed. See her note for details.  I agree with the assessment and plan as outlined. Any exceptions or additions are edited/noted.    Selinda Rocher, MD

## 2023-07-26 NOTE — Progress Notes (Signed)
 [facility]  HVI Critical Care Consult/Progress Note    Elizabeth [name], Elizabeth [name], 53 y.o. female  Date of Birth: 30-May-1970  Medical Record Number:  [id number]   Inpatient Admission Date: 07/24/2023   Hospital Day:  LOS: 2 days     Chief Complaint: Post-op medical management    History of Present Illness: Per previous provider Alisan Dokes is a 53 y.o. 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 Pt presented to outside hospital 6/11 with acute chest pain and workup demonstrated new segmental and subsegmental PE. A new stomach containing diaphragmatic hernia with air fluid levels was present on this scan. She was admitted and initiated on heparin  gtt and transitioned to Eliquis  for discharge on 6/14. She re-presented to outside hospital for worsening chest pain and CT demonstrated increased size of stomach containing diaphragmatic hernia with air fluid levels. Transferred to [facility] for further management. Pt currently reports pain from the NGT. Denies abdominal pain, chest pain, nausea,  SOB, fevers or chills. She denies any difficulty with food intake and hasn't had any vomiting. Last dose of Eliquis  6/18 morning. PSH includes open left nephrectomy 06/19/23 and remote laparoscopic cholecystectomy.     POD #/ Procedure: 1 Day Post-Op s/p  Robotic diaphragm hernia repair, gastropexy, PEG tube placement    Hospital Course/Major events:  6/21: arrived to CVICU extubated on no gtts. Plan per TS to be strict NPO.    Past Medical History: Past Medical History:   Diagnosis Date    Anemia     Cancer (CMS HCC)     brain tumor 8/15 with chemo and radiation    Chest pain, unspecified type 07/17/2023    CPAP (continuous positive airway pressure) dependence     CVA (cerebrovascular accident)     mini during brain surgery per pt     Diabetes mellitus, type 2     Disorder of thyroid     Dyspnea, unspecified type 07/17/2023    Epileptic seizure (CMS HCC)     Essential hypertension     History of kidney surgery 06/2023    Pulmonary embolism 07/16/2023    Sleep apnea        Past Surgical History: Past Surgical History:   Procedure Laterality Date    BRAIN SURGERY      HX CHOLECYSTECTOMY        Social History:  Social History     Socioeconomic History    Marital status: Divorced     Spouse name: Not on file    Number of children: Not on file    Years of education: Not on file    Highest education level: Not on file   Occupational History    Not on file   Tobacco Use    Smoking status: Never    Smokeless tobacco: Never   Vaping Use    Vaping status: Never Used   Substance and Sexual Activity    Alcohol use: Never    Drug use: Never    Sexual activity: Not on file   Other Topics Concern    Ability to Walk 1 Flight of Steps without SOB/CP Yes    Routine Exercise No    Ability to Walk 2 Flight of Steps without SOB/CP No    Unable to Ambulate Not Asked    Total Care Not  Asked    Ability To Do Own ADL's Yes    Uses Walker Not Asked    Other Activity Level Not Asked    Uses Cane Not Asked   Social History Narrative    Not on file     Social Determinants of Health     Financial Resource Strain: Low Risk  (07/24/2023)    Financial Resource Strain     SDOH Financial: No   Transportation Needs: High Risk (07/24/2023)    Transportation Needs     SDOH Transportation: Yes, it has kept me from medical appointments or from getting my medications   Social Connections: Low Risk  (07/24/2023)    Social Connections     SDOH Social Isolation: 5 or more times a week   Intimate Partner Violence: Not on file   Housing Stability: Low Risk  (07/24/2023)    Housing Stability     SDOH Housing Situation: I have housing.     SDOH Housing Worry: No      Family History: Family Medical History:       Problem Relation (Age of Onset)    Arthritis-osteo Mother    Breast Cancer Paternal  Grandmother    Heart Disease Father    Hypertension (High Blood Pressure) Mother    No Known Problems Sister, Brother, Maternal Grandmother, Maternal Grandfather, Paternal Grandfather, Daughter, Son, Maternal Aunt, Maternal Uncle, Paternal Aunt, Paternal Uncle, Other             Allergies: Allergies[1]   ROS:  Unobtainable because lethargic      Problem List: Problem List[2]    Medications:  Scheduled Meds: Correction/SSIP insulin  lispro 100 units/mL injection, 2-9 Units, 4x/day AC  fosphenytoin  (CEREBYX ) IVPB maintenance dose, 200 mg, Q8H  NS flush, 2-6 mL, Q8HRS  NS flush, 2-6 mL, Q8HRS  NS flush, 2-6 mL, Q8HRS      IV Infusions:    PRN:  D5W, , Q15 Min PRN  D5W, , Q15 Min PRN  D5W, , Q15 Min PRN  dextrose , 15 g, Q15 Min PRN  dextrose , 12.5 g, Q15 Min PRN  glucagon  (GLUCAGEN) injection, 1 mg, Once PRN  hydrALAZINE (APRESOLINE) injection, 10 mg, Q6H PRN  NS, , Q15 Min PRN  NS, , Q15 Min PRN  NS, , Q15 Min PRN  NS bolus, 40 mL, Once PRN  NS flush, 2-6 mL, Q1 MIN PRN  NS flush, 2-6 mL, Q1 MIN PRN  NS flush, 2-6 mL, Q1 MIN PRN        Last VS:    Temperature: 36.4 C (97.5 F)  Heart Rate: (!) 107  Respiratory Rate: 14  BP (Non-Invasive): (!) 159/89      Comprehensive Physical Exam:    Constitutional: appears chronically ill, moderately obese, appears older than stated age, no distress, and vital signs reviewed  Eyes: Conjunctiva clear., Sclera non-icteric.   ENT: Mouth mucous membranes moist.   Neck: supple, symmetrical, trachea midline  Respiratory: Breathing nonlabored, Clear to auscultation bilaterally.  Left CT to -20sx   Cardiovascular: regular rate and rhythm on the monitor  no murmurs  No JVD  Gastrointestinal: non-distended, Soft, non-tender PEG tube to gravity   Genitourinary: foley in place w/ clear yellow urine noted   Musculoskeletal: AROM and PROM  Integumentary:  Skin warm and dry and ABD incision OTA w/ no signs of infection or drainage   Neurologic: UTA  Psychiatric: UTA      [x] I have reviewed the  patient's vitals signs, the nursing  notes, the physician's notes and other progress notes   [x] I have reviewed the laboratory and imaging data  [x] I have discussed this case with HVI CCM attending and Thoracic Surgery       Systems Based Assessment and Plan:    Neurologic:  Data/Assessment CAM: Reviewed   GCS: Reviewed    Diagnosis Incisional pain  Mood Disorder   Hx Brain tumor s/p chemo/radiation and resection, CVA, Seizures,    Course: Critical   Plan IV Tylenol  and Dilaudid  PRN    Takes dilantin  at home, continue fosphenytoin  while strict NPO   Serial neuro exams  Day / Night hygiene   PT / OT  OOB as tolerated  Meds on hold: Lexapro       Cardiovascular:  Data/Assessment Most Recent Hemodynamics: Reviewed   Cardiovascular support: None  Temp pacer Wires:  N/A  IABP/VAD/VAECMO Settings: N/A   Diagnosis Hypertension  Hx HTN, HLD   Course Critical   Plan Cardene gtt for HTN   Monitor Electrolytes  Serial hemodynamic checks  Holding home Losartan  and Lipitor while NPO      Respiratory:  Data/Assessment: CTO #1: Reviewed   CXR: Reviewed   Most recent ABG: Reviewed   Vent Settings: N/A  VVECMO: N/A   Diagnosis 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 repair, gastropexy, PEG tube placement   Recent segmental and subsegmental PE   Hx OSA   Course Critical   Plan Holding home Eliquis , per discussion with TS, continue to hold heparin  gtt overnight, team will reassess timing for heparin  gtt tomorrow   Maintain CT to -20 sx, continue to monitor output   Holding home CPAP per TS to avoid positive pressure       Renal:  Data/Assessment: Reviewed    Diagnosis Hypovolemia     Hx RCC s/p left nephrectomy 06/19/23   Course Critical   Plan Continue MIVF at 50 ml/hr   Replacing electrolytes per order  Strict I&O  Foley intact  BMP daily and PRN     Follows w/ Urology and Oncology outpatient  for recent nephrectomy      Gastrointestinal:  Data/Assessment: Pantoprazole (Protonix)  for stress ulcer prophylaxis   Diet:   NPO   Bowel Regimen:  NPO currently    Diagnosis 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 repair, gastropexy, PEG tube placement   Hx GERD   Course  Critical   Plan Strict NPO   Gtube to gravity   PPI as above, takes Pepcid  at home      Endocrine:  Data/Assessment Last three Fingerstick glucose: Reviewed   Last HbA1C: 5.1  Most recent Thyroid results: Pending      Diagnosis Insulin  dependent diabetes mellitus (IDDM)   Course Unchanged   Plan SSI Q4h while NPO  Target glucose 140-180  TSH pending      Hematologic:  Data/Assessment:  Reviewed    Diagnosis No active diagnosis   Course Stable   Plan SCDs (Sequential Compression Device)  For DVT Prophylaxis  CBC and coags daily and PRN     Infectious Diseases:  Data/Assessment Tmax last 24 hrs: Temp (24hrs) Max:37.2 C (98.9 F)       Diagnosis No active diagnosis    Course Stable   Plan Antibiotic therapy: will discuss w/ TS about post-op Ppx coverage needs        Family Communication and Disposition:  Data/Assessment Will update when available      Critical Care E&M Attestation:  I independently of the  faculty provider spent a total of (65) minutes in direct/indirect care of this patient including initial evaluation, review of laboratory, radiology, diagnostic studies, review of medical record, order entry and coordination of care.      Halie B Hartman, APRN,FNP-BC, AGACNP-BC, 07/26/2023    Critical Care Addendum  I was present at the bedside of this critically ill patient.  I saw and examined the patient and discussed the patient with the CVICU team.  I have reviewed the NP/PA's note and agree with the assessment and plan, with modifications noted within the note.  This patient suffers from acute issues as detailed above. The care of this patient was in regard to managing condition(s) that have a high probability of sudden, clinically significant or life-threatening deterioration and require a high degree of attending physician attention.  The data  reviewed and care planning were performed in direct proximity of the patient.  All critical care time was spent exclusive of procedures which will be documented elsewhere in the chart.  My critical care time is independent and unique to other providers.  Medications, allergies, vital signs, lab tests, imaging, nursing notes and physician notes have been reviewed.      07/26/23 05:56     Brigido Mera A. Simona Rocque, MD  Assistant Professor  Cardiovascular Critical Care and Pulmonary Hypertension  [facility]           [1]   Allergies  Allergen Reactions    Ace Inhibitors      Other reaction(s): Angioedema   [2]   Patient Active Problem List  Diagnosis    Left renal mass    Kidney mass    Pulmonary embolism    Chest pain, unspecified type    Dyspnea, unspecified type    History of nephrectomy    History of astrocytoma    Clear cell renal cell carcinoma, left (CMS HCC)    Pulmonary nodules    Diaphragmatic hernia

## 2023-07-27 ENCOUNTER — Inpatient Hospital Stay (HOSPITAL_COMMUNITY)

## 2023-07-27 LAB — POC BLOOD GLUCOSE (RESULTS)
GLUCOSE, POC: 104 mg/dL (ref 65–125)
GLUCOSE, POC: 84 mg/dL (ref 65–125)
GLUCOSE, POC: 89 mg/dL (ref 65–125)
GLUCOSE, POC: 90 mg/dL (ref 65–125)
GLUCOSE, POC: 95 mg/dL (ref 65–125)
GLUCOSE, POC: 97 mg/dL (ref 65–125)

## 2023-07-27 LAB — BASIC METABOLIC PANEL
ANION GAP: 11 mmol/L (ref 4–13)
BUN/CREA RATIO: 14 (ref 6–22)
BUN: 10 mg/dL (ref 8–25)
CALCIUM: 8.3 mg/dL — ABNORMAL LOW (ref 8.6–10.2)
CHLORIDE: 108 mmol/L (ref 96–111)
CO2 TOTAL: 23 mmol/L (ref 22–30)
CREATININE: 0.72 mg/dL (ref 0.60–1.05)
ESTIMATED GFR - FEMALE: >90 mL/min/BSA (ref >=60)
GLUCOSE: 92 mg/dL (ref 65–125)
POTASSIUM: 3.8 mmol/L (ref 3.5–5.1)
SODIUM: 142 mmol/L (ref 136–145)

## 2023-07-27 LAB — CBC
HCT: 35.2 % (ref 34.8–46.0)
HGB: 11.5 g/dL (ref 11.5–16.0)
MCH: 31.3 pg (ref 26.0–32.0)
MCHC: 32.7 g/dL (ref 31.0–35.5)
MCV: 95.7 fL (ref 78.0–100.0)
MPV: 9 fL (ref 8.7–12.5)
PLATELETS: 216 10*3/uL (ref 150–400)
RBC: 3.68 10*6/uL — ABNORMAL LOW (ref 3.85–5.22)
RDW-CV: 13.7 % (ref 11.5–15.5)
WBC: 10.9 10*3/uL (ref 3.7–11.0)

## 2023-07-27 LAB — PHOSPHORUS: PHOSPHORUS: 2.4 mg/dL (ref 2.4–4.7)

## 2023-07-27 LAB — MAGNESIUM: MAGNESIUM: 2 mg/dL (ref 1.8–2.6)

## 2023-07-27 LAB — ADULT ROUTINE BLOOD CULTURE, SET OF 2 BOTTLES (BACTERIA AND YEAST)
BLOOD CULTURE, ROUTINE: NO GROWTH
BLOOD CULTURE, ROUTINE: NO GROWTH

## 2023-07-27 MED ORDER — POTASSIUM CHLORIDE ER 20 MEQ TABLET,EXTENDED RELEASE(PART/CRYST)
20.0000 meq | ORAL_TABLET | ORAL | Status: DC
Start: 2023-07-27 — End: 2023-07-27

## 2023-07-27 MED ORDER — POTASSIUM CHLORIDE 10 MEQ/100ML IN STERILE WATER INTRAVENOUS PIGGYBACK
10.0000 meq | INJECTION | INTRAVENOUS | Status: AC
Start: 2023-07-27 — End: 2023-07-27
  Administered 2023-07-27 (×2): 0 meq via INTRAVENOUS
  Administered 2023-07-27 (×2): 10 meq via INTRAVENOUS
  Filled 2023-07-27 (×2): qty 100

## 2023-07-27 MED ORDER — POTASSIUM CHLORIDE 10 MEQ/100ML IN STERILE WATER INTRAVENOUS PIGGYBACK
10.0000 meq | INJECTION | INTRAVENOUS | Status: DC
Start: 2023-07-27 — End: 2023-07-27

## 2023-07-27 NOTE — Nurses Notes (Signed)
 SCT Shift Note   HR 101  RR 22  BP 139/73  MAP 88  Spo2 94       Patient is POD 2 s/p Robotic diaphragm hernia repair, gastropexy, PEG tube placement . Patient is AO x  4 and is on RA. Oxygen saturations and respirations have remained WDL. Patient's BP has been running 130s/70s with a MAP of 80s. Through the shift the patient remains on normosol infusion at 50 ml/hr.This morning, the patient was advanced to full liquid diet and is tolerating the diet well. No bowel movement yet postop. foley catheter removed at 1230 patient still DTV. Chest tubes have put out a total of 80mL this shift draining SR to SS this shift. Patient received trasnfer orders for a step down bed.Care is ongoing.

## 2023-07-27 NOTE — Progress Notes (Signed)
 [facility]  Thoracic Surgery   Progress Note      Elizabeth [name], Elizabeth [name]  Date of Admission:  07/24/2023  Date of Birth:  [birth year]    Hospital Day:  LOS: 3 days   Date of Service:  07/27/2023    Subjective   NAEO. Patient resting in bed this morning. Reports some pain in her LUQ.    Outputs:  Abdominal JP drain: 55cc  Left chest tube: 300cc  PEG: 125cc    Current Diet:    MNT PROTOCOL FOR DIETITIAN  DIET NPO - NOW STRICT    Current Medications:  Correction/SSIP insulin  lispro 100 units/mL injection, 2-9 Units, Subcutaneous, 4x/day AC  D5W 250 mL flush bag, , Intravenous, Q15 Min PRN  dextrose  (GLUTOSE) 40% oral gel, 15 g, Oral, Q15 Min PRN  dextrose  50% (0.5 g/mL) injection - syringe, 12.5 g, Intravenous, Q15 Min PRN  electrolyte-R pH 7.4 (NORMOSOL-R pH 7.4) premix infusion, , Intravenous, Continuous  fosphenytoin  (CEREBYX ) 200 mg in NS 50 mL IVPB, 200 mg, Intravenous, Q8H  glucagon  injection 1 mg, 1 mg, IntraMUSCULAR, Once PRN  heparin  5,000 unit/mL injection, 5,000 Units, Subcutaneous, Q8HRS  hydrALAZINE  (APRESOLINE ) injection 10 mg, 10 mg, Intravenous, Q6H PRN  HYDROmorphone  (DILAUDID ) 0.5 mg/0.5 mL injection, 0.2 mg, Intravenous, Q4H PRN  niCARdipine  (CARDENE ) 50 mg in NS 250 mL (tot vol) infusion, 0-15 mg/hr, Intravenous, Continuous  NS 250 mL flush bag, , Intravenous, Q15 Min PRN  NS bolus infusion 40 mL, 40 mL, Intravenous, Once PRN  NS flush syringe, 2-6 mL, Intracatheter, Q8HRS  NS flush syringe, 2-6 mL, Intracatheter, Q1 MIN PRN  pantoprazole  (PROTONIX ) 40 mg in NS 10 mL injection, 40 mg, Intravenous, Daily      Vital Signs:  Temp (24hrs) Max:36.8 C (98.2 F)  Temperature: 36.6 C (97.8 F)  BP (Non-Invasive): 130/69  MAP (Non-Invasive): 86 mmHG  Heart Rate: (!) 103  Respiratory Rate: (!) 21  SpO2: 94 %  Base (Admission) Weight:  Weight: 83.2 kg (183 lb 6.8 oz)  Weight:  Weight: 85.3 kg (188 lb 0.8 oz)    Physical Exam:  General: no acute distress, appears stated age  Head: normocephalic,  atraumatic  Chest: clear, normal excursion, left chest tube in place with serosanguinous output   Heart: RRR  Abdomen: soft, non-distended, abdominal JP drain in place with serosanguinous output  Extremeties: no edema, cyanosis  Skin: normal turgor, non-icteric  Neuro: no focal neurological deficit    Labs:  I have reviewed all pertinent lab results as below:  Results for orders placed or performed during the hospital encounter of 07/24/23 (from the past 24 hours)   POC BLOOD GLUCOSE (RESULTS)   Result Value Ref Range    GLUCOSE, POC 128 (H) 65 - 125 mg/dl   POC BLOOD GLUCOSE (RESULTS)   Result Value Ref Range    GLUCOSE, POC 106 65 - 125 mg/dl   POC BLOOD GLUCOSE (RESULTS)   Result Value Ref Range    GLUCOSE, POC 92 65 - 125 mg/dl   POC BLOOD GLUCOSE (RESULTS)   Result Value Ref Range    GLUCOSE, POC 125 65 - 125 mg/dl   BASIC METABOLIC PANEL   Result Value Ref Range    SODIUM 142 136 - 145 mmol/L    POTASSIUM 3.8 3.5 - 5.1 mmol/L    CHLORIDE 108 96 - 111 mmol/L    CO2 TOTAL 23 22 - 30 mmol/L    ANION GAP 11 4 - 13 mmol/L  CALCIUM  8.3 (L) 8.6 - 10.2 mg/dL    GLUCOSE 92 65 - 874 mg/dL    BUN 10 8 - 25 mg/dL    CREATININE 9.27 9.39 - 1.05 mg/dL    BUN/CREA RATIO 14 6 - 22    ESTIMATED GFR - FEMALE >90 >=60 mL/min/BSA   PHOSPHORUS   Result Value Ref Range    PHOSPHORUS 2.4 2.4 - 4.7 mg/dL   MAGNESIUM    Result Value Ref Range    MAGNESIUM  2.0 1.8 - 2.6 mg/dL   CBC   Result Value Ref Range    WBC 10.9 3.7 - 11.0 x10^3/uL    RBC 3.68 (L) 3.85 - 5.22 x10^6/uL    HGB 11.5 11.5 - 16.0 g/dL    HCT 64.7 65.1 - 53.9 %    MCV 95.7 78.0 - 100.0 fL    MCH 31.3 26.0 - 32.0 pg    MCHC 32.7 31.0 - 35.5 g/dL    RDW-CV 86.2 88.4 - 84.4 %    PLATELETS 216 150 - 400 x10^3/uL    MPV 9.0 8.7 - 12.5 fL   POC BLOOD GLUCOSE (RESULTS)   Result Value Ref Range    GLUCOSE, POC 97 65 - 125 mg/dl   POC BLOOD GLUCOSE (RESULTS)   Result Value Ref Range    GLUCOSE, POC 90 65 - 125 mg/dl     Radiology:  (images and reports personally reviewed):       AM CXR 6/22:  IMPRESSION:  Stable examination with left-sided chest tube in place.      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 OR last night for robotic-assisted diaphragm hernia repair, gastropexy, and PEG placement.    PLAN:    Left diaphragmatic hernia s/p robotic-assisted diaphragm hernia repair, gastropexy, and PEG placement on 6/20   -Okay to start full liquid diet today   -Clamp PEG tube    -Vent tube q6hr or as needed   -Remove foley catheter   -Abdominal JP drain to bulb suction   -Left chest tube to -20 suction   -Okay from our standpoint to start heparin  drip today   -AM labs and CXR    Rest of care per CVICU, appreciate assistance    I independently of the faculty provider spent a total of (15) minutes in direct/indirect care of this patient including initial evaluation, review of laboratory, radiology, diagnostic studies, review of medical record, order entry and coordination of care.      Damien Moose, PA-C          I both saw and examined the patient 07/27/2023 in concert with Damien Moose, PA-C and the resident team. Any pertinent interval imaging studies and labs were personally reviewed. See her note for details.  I agree with the assessment and plan as outlined. Any exceptions or additions are edited/noted.    Selinda Rocher, MD

## 2023-07-27 NOTE — Nurses Notes (Signed)
 Temp: 97.8  HR: 97  BP: 115/72 (86)  SpO2: 93  RR: 19     Patient is POD day 2 s/p hernia repair and PEG placement. Patient is AO x  3, still having trouble intermittently with time. NC weaned overnight, oxygen saturations and respirations have remained WDL. Patient is practicing coughing and deep breathing exercises using their pep valve and IS. Patient's heart rhythm remains NS-ST 90-100s. Patient's bowel sounds are still hypoactive. No bowel movement yet postop but bowel regimen given per eMAR. Patient remains NPO at this time. PEG tube to gravity, JP drain draining appropriately. Foley catheter remains in place; UO has been 30  -   60 ml/hr through the shift. Chest tube has put out a total of this shift draining SR this shift. Dressings changed and bath complete. Patient ambulated OOBTC with an assist x2. Care is ongoing.    Elberta Frederic, RN, 07/27/2023 04:47

## 2023-07-27 NOTE — Progress Notes (Signed)
 [facility]  HVI Critical Care Consult/Progress Note    Sada, Mazzoni, 53 y.o. female  Date of Birth: October 25, 1970  Medical Record Number:  [id number]   Inpatient Admission Date: 07/24/2023   Hospital Day:  LOS: 3 days     Chief Complaint: Post-op medical management    History of Present Illness: Per previous provider Murlene Revell is a 53 y.o. 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 Pt presented to outside hospital 6/11 with acute chest pain and workup demonstrated new segmental and subsegmental PE. A new stomach containing diaphragmatic hernia with air fluid levels was present on this scan. She was admitted and initiated on heparin  gtt and transitioned to Eliquis  for discharge on 6/14. She re-presented to outside hospital for worsening chest pain and CT demonstrated increased size of stomach containing diaphragmatic hernia with air fluid levels. Transferred to [facility] for further management. Pt currently reports pain from the NGT. Denies abdominal pain, chest pain, nausea,  SOB, fevers or chills. She denies any difficulty with food intake and hasn't had any vomiting. Last dose of Eliquis  6/18 morning. PSH includes open left nephrectomy 06/19/23 and remote laparoscopic cholecystectomy.     POD #/ Procedure: 2 Days Post-Op s/p  Robotic diaphragm hernia repair, gastropexy, PEG tube placement    Hospital Course/Major events:  6/21: arrived to CVICU extubated on no gtts. Plan per TS to be strict NPO.   6/22:  cap G tube.  Start CLD.  Transfer to SDU.   Past Medical History: Past Medical History:   Diagnosis Date    Anemia     Cancer (CMS HCC)     brain tumor 8/15 with chemo and radiation    Chest pain, unspecified type 07/17/2023    CPAP (continuous positive airway pressure) dependence     CVA (cerebrovascular  accident)     mini during brain surgery per pt    Diabetes mellitus, type 2     Disorder of thyroid     Dyspnea, unspecified type 07/17/2023    Epileptic seizure (CMS HCC)     Essential hypertension     History of kidney surgery 06/2023    Pulmonary embolism 07/16/2023    Sleep apnea        Past Surgical History: Past Surgical History:   Procedure Laterality Date    BRAIN SURGERY      HX CHOLECYSTECTOMY        Social History:  Social History     Socioeconomic History    Marital status: Divorced     Spouse name: Not on file    Number of children: Not on file    Years of education: Not on file    Highest education level: Not on file   Occupational History    Not on file   Tobacco Use    Smoking status: Never    Smokeless tobacco: Never   Vaping Use    Vaping status: Never Used   Substance and Sexual Activity    Alcohol use: Never    Drug use: Never    Sexual activity: Not on file   Other Topics Concern    Ability to Walk 1 Flight of Steps without SOB/CP Yes    Routine Exercise No    Ability to Walk 2 Flight of Steps without SOB/CP No  Unable to Ambulate Not Asked    Total Care Not Asked    Ability To Do Own ADL's Yes    Uses Walker Not Asked    Other Activity Level Not Asked    Uses Cane Not Asked   Social History Narrative    Not on file     Social Determinants of Health     Financial Resource Strain: Low Risk  (07/24/2023)    Financial Resource Strain     SDOH Financial: No   Transportation Needs: High Risk (07/24/2023)    Transportation Needs     SDOH Transportation: Yes, it has kept me from medical appointments or from getting my medications   Social Connections: Low Risk  (07/24/2023)    Social Connections     SDOH Social Isolation: 5 or more times a week   Intimate Partner Violence: Not on file   Housing Stability: Low Risk  (07/24/2023)    Housing Stability     SDOH Housing Situation: I have housing.     SDOH Housing Worry: No      Family History: Family Medical History:       Problem Relation (Age of Onset)     Arthritis-osteo Mother    Breast Cancer Paternal Grandmother    Heart Disease Father    Hypertension (High Blood Pressure) Mother    No Known Problems Sister, Brother, Maternal Grandmother, Maternal Grandfather, Paternal Grandfather, Daughter, Son, Maternal Aunt, Maternal Uncle, Paternal Aunt, Paternal Uncle, Other             Allergies: Allergies[1]   ROS:  Unobtainable because lethargic      Problem List: Problem List[2]    Medications:  Scheduled Meds: Correction/SSIP insulin  lispro 100 units/mL injection, 2-9 Units, 4x/day AC  fosphenytoin  (CEREBYX ) IVPB maintenance dose, 200 mg, Q8H  heparin , 5,000 Units, Q8HRS  NS flush, 2-6 mL, Q8HRS  pantoprazole, 40 mg, Daily      IV Infusions: electrolyte-R pH 7.4, Last Rate: 50 mL/hr at 07/26/23 0722  niCARdipine (CARDENE) infusion, Last Rate: Stopped (07/26/23 1330)      PRN:  D5W, , Q15 Min PRN  dextrose , 15 g, Q15 Min PRN  dextrose , 12.5 g, Q15 Min PRN  glucagon  (GLUCAGEN) injection, 1 mg, Once PRN  hydrALAZINE (APRESOLINE) injection, 10 mg, Q6H PRN  HYDROmorphone  (DILAUDID ) injection, 0.2 mg, Q4H PRN  NS, , Q15 Min PRN  NS bolus, 40 mL, Once PRN  NS flush, 2-6 mL, Q1 MIN PRN        Last VS:    Temperature: 36.6 C (97.8 F)  Heart Rate: (!) 102  Respiratory Rate: (!) 25  BP (Non-Invasive): 127/74      Comprehensive Physical Exam:    Constitutional: appears chronically ill, moderately obese, appears older than stated age, no distress, and vital signs reviewed  Eyes: Conjunctiva clear., Sclera non-icteric.   ENT: Mouth mucous membranes moist.   Neck: supple, symmetrical, trachea midline  Respiratory: Breathing nonlabored, Clear to auscultation bilaterally.  Left CT to -20sx   Cardiovascular: regular rate and rhythm on the monitor  no murmurs  No JVD  Gastrointestinal: non-distended, Soft, non-tender PEG tube to gravity   Genitourinary: foley in place w/ clear yellow urine noted   Musculoskeletal: AROM and PROM  Integumentary:  Skin warm and dry and ABD incision OTA w/  no signs of infection or drainage   Neuro:  GCS 15      [x] I have reviewed the patient's vitals signs, the nursing notes, the physician's  notes and other progress notes   [x] I have reviewed the laboratory and imaging data  [x] I have discussed this case with HVI CCM attending and Thoracic Surgery       Systems Based Assessment and Plan:    Neurologic:  Data/Assessment CAM: Reviewed   GCS: Reviewed    Diagnosis Incisional pain  Mood Disorder   Hx Brain tumor s/p chemo/radiation and resection, CVA, Seizures,    Course: Critical   Plan IV Tylenol  and Dilaudid  PRN    Takes dilantin  at home, continue fosphenytoin  while strict NPO   Serial neuro exams  Day / Night hygiene   PT / OT  OOB as tolerated  Meds on hold: Lexapro       Cardiovascular:  Data/Assessment Most Recent Hemodynamics: Reviewed   Cardiovascular support: None  Temp pacer Wires:  N/A  IABP/VAD/VAECMO Settings: N/A   Diagnosis Hypertension  Hx HTN, HLD   Course Critical   Plan Cardene gtt for HTN   Monitor Electrolytes  Serial hemodynamic checks  Holding home Losartan  and Lipitor while NPO      Respiratory:  Data/Assessment: CTO #1: Reviewed   CXR: Reviewed   Most recent ABG: Reviewed   Vent Settings: N/A  VVECMO: N/A   Diagnosis 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 repair, gastropexy, PEG tube placement   Recent segmental and subsegmental PE   Hx OSA   Course Critical   Plan Holding home Eliquis , per discussion with TS, continue to hold heparin  gtt overnight, team will reassess timing for heparin  gtt tomorrow   Maintain CT to -20 sx, continue to monitor output   Holding home CPAP per TS to avoid positive pressure       Renal:  Data/Assessment: Reviewed    Diagnosis Hypovolemia     Hx RCC s/p left nephrectomy 06/19/23   Course Critical   Plan Continue MIVF at 50 ml/hr   Replacing electrolytes per order  Strict I&O  Foley intact  BMP daily and PRN     Follows w/ Urology and Oncology outpatient  for recent nephrectomy       Gastrointestinal:  Data/Assessment: Pantoprazole (Protonix)  for stress ulcer prophylaxis   Diet:  NPO   Bowel Regimen:  NPO currently    Diagnosis 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 repair, gastropexy, PEG tube placement   Hx GERD   Course  Critical   Plan Clear liquids OK per TS  Gtube to gravity   PPI as above, takes Pepcid  at home      Endocrine:  Data/Assessment Last three Fingerstick glucose: Reviewed   Last HbA1C: 5.1  Most recent Thyroid results: 1.299     Diagnosis Insulin  dependent diabetes mellitus (IDDM)   Course Unchanged   Plan SSI Q4h while NPO  Target glucose 140-180     Hematologic:  Data/Assessment:  Reviewed    Diagnosis No active diagnosis   Course Stable   Plan SCDs (Sequential Compression Device)  For DVT Prophylaxis  CBC and coags daily and PRN     Infectious Diseases:  Data/Assessment Tmax last 24 hrs: Temp (24hrs) Max:36.8 C (98.2 F)       Diagnosis No active diagnosis    Course Stable   Plan Antibiotic therapy:  per TS       Family Communication and Disposition:  Data/Assessment Pt updated at bedside; all questions answered.       Attending Attestation:  Pt seen and examined at bedside.  Pt d/w CVICU team.  The data reviewed  and care planning were performed in direct proximity of the patient.  Medications, allergies, vital signs, lab tests, imaging, nursing notes and physician notes have been reviewed.    independent provider NonCCM time in overall assessment, review, documentation, family discussion; excludes procedure time.    Jon DOROTHA Spring, MD  HVI CCM           [1]   Allergies  Allergen Reactions    Ace Inhibitors      Other reaction(s): Angioedema   [2]   Patient Active Problem List  Diagnosis    Left renal mass    Kidney mass    Pulmonary embolism    Chest pain, unspecified type    Dyspnea, unspecified type    History of nephrectomy    History of astrocytoma    Clear cell renal cell carcinoma, left (CMS HCC)    Pulmonary nodules    Diaphragmatic  hernia    Confusion

## 2023-07-27 NOTE — Nurses Notes (Signed)
 Report received from day shift RN. Kardex, MAR, and labs reviewed. VSS. Assessment completed per flow sheet. Patient is resting in bed, call bell is within reach and 3 side rails are up. No s/s of distress or complaints. Will continue to monitor patient closely at this time.    Nestora Constant, RN  07/27/2023 19:29

## 2023-07-28 ENCOUNTER — Inpatient Hospital Stay (HOSPITAL_COMMUNITY)

## 2023-07-28 DIAGNOSIS — Z85528 Personal history of other malignant neoplasm of kidney: Secondary | ICD-10-CM

## 2023-07-28 DIAGNOSIS — Z9049 Acquired absence of other specified parts of digestive tract: Secondary | ICD-10-CM

## 2023-07-28 DIAGNOSIS — Z9221 Personal history of antineoplastic chemotherapy: Secondary | ICD-10-CM

## 2023-07-28 DIAGNOSIS — Z79899 Other long term (current) drug therapy: Secondary | ICD-10-CM

## 2023-07-28 DIAGNOSIS — R918 Other nonspecific abnormal finding of lung field: Secondary | ICD-10-CM

## 2023-07-28 DIAGNOSIS — Z923 Personal history of irradiation: Secondary | ICD-10-CM

## 2023-07-28 DIAGNOSIS — Z7901 Long term (current) use of anticoagulants: Secondary | ICD-10-CM

## 2023-07-28 LAB — BASIC METABOLIC PANEL
ANION GAP: 10 mmol/L (ref 4–13)
BUN/CREA RATIO: 14 (ref 6–22)
BUN: 10 mg/dL (ref 8–25)
CALCIUM: 8.3 mg/dL — ABNORMAL LOW (ref 8.6–10.2)
CHLORIDE: 105 mmol/L (ref 96–111)
CO2 TOTAL: 23 mmol/L (ref 22–30)
CREATININE: 0.7 mg/dL (ref 0.60–1.05)
ESTIMATED GFR - FEMALE: >90 mL/min/BSA (ref >=60)
GLUCOSE: 87 mg/dL (ref 65–125)
POTASSIUM: 3.9 mmol/L (ref 3.5–5.1)
SODIUM: 138 mmol/L (ref 136–145)

## 2023-07-28 LAB — CBC
HCT: 34.7 % — ABNORMAL LOW (ref 34.8–46.0)
HCT: 34.5 % — ABNORMAL LOW (ref 34.8–46.0)
HGB: 11.4 g/dL — ABNORMAL LOW (ref 11.5–16.0)
HGB: 11.7 g/dL (ref 11.5–16.0)
MCH: 31.3 pg (ref 26.0–32.0)
MCH: 32.1 pg — ABNORMAL HIGH (ref 26.0–32.0)
MCHC: 32.9 g/dL (ref 31.0–35.5)
MCHC: 33.9 g/dL (ref 31.0–35.5)
MCV: 95.3 fL (ref 78.0–100.0)
MCV: 94.8 fL (ref 78.0–100.0)
MPV: 8.8 fL (ref 8.7–12.5)
MPV: 8.8 fL (ref 8.7–12.5)
PLATELETS: 216 10*3/uL (ref 150–400)
PLATELETS: 224 10*3/uL (ref 150–400)
RBC: 3.64 10*6/uL — ABNORMAL LOW (ref 3.85–5.22)
RBC: 3.64 10*6/uL — ABNORMAL LOW (ref 3.85–5.22)
RDW-CV: 13.5 % (ref 11.5–15.5)
RDW-CV: 13.6 % (ref 11.5–15.5)
WBC: 10.3 10*3/uL (ref 3.7–11.0)
WBC: 9.7 10*3/uL (ref 3.7–11.0)

## 2023-07-28 LAB — PHOSPHORUS: PHOSPHORUS: 1.9 mg/dL — ABNORMAL LOW (ref 2.4–4.7)

## 2023-07-28 LAB — PTT (PARTIAL THROMBOPLASTIN TIME)
APTT: 29.3 s (ref 24.3–37.6)
APTT: 85.9 s — ABNORMAL HIGH (ref 24.3–37.6)
APTT: 89 s — ABNORMAL HIGH (ref 24.3–37.6)

## 2023-07-28 LAB — POC BLOOD GLUCOSE (RESULTS)
GLUCOSE, POC: 93 mg/dL (ref 65–125)
GLUCOSE, POC: 86 mg/dL (ref 65–125)
GLUCOSE, POC: 103 mg/dL (ref 65–125)
GLUCOSE, POC: 95 mg/dL (ref 65–125)

## 2023-07-28 LAB — MAGNESIUM: MAGNESIUM: 2 mg/dL (ref 1.8–2.6)

## 2023-07-28 MED ORDER — HEPARIN (PORCINE) 25,000 UNIT/250 ML IN 0.45 % SODIUM CHLORIDE IV SOLN
18.0000 [IU]/kg/h | INTRAVENOUS | Status: DC
Start: 2023-07-28 — End: 2023-07-31
  Administered 2023-07-28 (×2): 18 [IU]/kg/h via INTRAVENOUS
  Administered 2023-07-29: 19 [IU]/kg/h via INTRAVENOUS
  Administered 2023-07-29 (×2): 18 [IU]/kg/h via INTRAVENOUS
  Administered 2023-07-29: 19 [IU]/kg/h via INTRAVENOUS
  Administered 2023-07-30: 20 [IU]/kg/h via INTRAVENOUS
  Administered 2023-07-30: 19 [IU]/kg/h via INTRAVENOUS
  Administered 2023-07-30: 20 [IU]/kg/h via INTRAVENOUS
  Administered 2023-07-30 (×2): 19 [IU]/kg/h via INTRAVENOUS
  Administered 2023-07-31: 20 [IU]/kg/h via INTRAVENOUS
  Administered 2023-07-31: 0 [IU]/kg/h via INTRAVENOUS
  Administered 2023-07-31: 20 [IU]/kg/h via INTRAVENOUS
  Filled 2023-07-28 (×4): qty 250

## 2023-07-28 MED ORDER — SODIUM CHLORIDE 0.9 % INTRAVENOUS SOLUTION
30.0000 mmol | INTRAVENOUS | Status: DC
Start: 2023-07-28 — End: 2023-07-28
  Filled 2023-07-28: qty 10

## 2023-07-28 MED ORDER — PHENYTOIN SODIUM EXTENDED 100 MG CAPSULE
300.0000 mg | ORAL_CAPSULE | Freq: Two times a day (BID) | ORAL | Status: DC
Start: 2023-07-28 — End: 2023-08-03
  Administered 2023-07-28 – 2023-08-03 (×13): 300 mg via ORAL
  Filled 2023-07-28 (×13): qty 3

## 2023-07-28 MED ORDER — LEVOTHYROXINE 25 MCG TABLET
25.0000 ug | ORAL_TABLET | Freq: Every morning | ORAL | Status: DC
Start: 2023-07-28 — End: 2023-08-03
  Administered 2023-07-28 – 2023-08-03 (×7): 25 ug via ORAL
  Filled 2023-07-28 (×7): qty 1

## 2023-07-28 MED ORDER — ESCITALOPRAM 20 MG TABLET
20.0000 mg | ORAL_TABLET | Freq: Every day | ORAL | Status: DC
Start: 2023-07-28 — End: 2023-08-03
  Administered 2023-07-28 – 2023-08-03 (×7): 20 mg via ORAL
  Filled 2023-07-28 (×8): qty 1

## 2023-07-28 MED ORDER — HEPARIN (PORCINE) 5,000 UNITS/ML BOLUS
80.0000 [IU]/kg | Freq: Once | INTRAMUSCULAR | Status: AC
Start: 2023-07-28 — End: 2023-07-28
  Administered 2023-07-28: 5000 [IU] via INTRAVENOUS
  Filled 2023-07-28: qty 1

## 2023-07-28 MED ORDER — ATORVASTATIN 10 MG TABLET
20.0000 mg | ORAL_TABLET | Freq: Every evening | ORAL | Status: DC
Start: 2023-07-28 — End: 2023-08-03
  Administered 2023-07-28 – 2023-08-03 (×7): 20 mg via ORAL
  Filled 2023-07-28 (×7): qty 2

## 2023-07-28 MED ORDER — FAMOTIDINE 20 MG TABLET
20.0000 mg | ORAL_TABLET | Freq: Every day | ORAL | Status: DC
Start: 2023-07-29 — End: 2023-08-03
  Administered 2023-07-29 – 2023-08-03 (×6): 20 mg via ORAL
  Filled 2023-07-28 (×6): qty 1

## 2023-07-28 MED ORDER — SODIUM DI- AND MONOPHOSPHATE-POTASSIUM PHOS MONOBASIC 250 MG TABLET
250.0000 mg | ORAL_TABLET | Freq: Four times a day (QID) | ORAL | Status: AC
Start: 2023-07-28 — End: 2023-07-28
  Administered 2023-07-28 (×4): 250 mg via ORAL
  Filled 2023-07-28 (×4): qty 1

## 2023-07-28 MED ORDER — OXYCODONE 5 MG TABLET
2.5000 mg | ORAL_TABLET | ORAL | Status: AC
Start: 2023-07-28 — End: 2023-07-28
  Administered 2023-07-28: 2.5 mg via ORAL
  Filled 2023-07-28: qty 1

## 2023-07-28 MED ORDER — ACETAMINOPHEN 325 MG TABLET
650.0000 mg | ORAL_TABLET | ORAL | Status: DC | PRN
Start: 2023-07-28 — End: 2023-08-03
  Administered 2023-07-28 – 2023-08-03 (×9): 650 mg via ORAL
  Filled 2023-07-28 (×9): qty 2

## 2023-07-28 MED ORDER — ERGOCALCIFEROL (VITAMIN D2) 200 MCG/ML (8,000 UNIT/ML) ORAL DROPS
50000.0000 [IU] | ORAL | Status: DC
Start: 2023-08-03 — End: 2023-08-03
  Administered 2023-08-03: 50000 [IU] via ORAL
  Filled 2023-07-28: qty 6.3

## 2023-07-28 MED ORDER — BISACODYL 10 MG RECTAL SUPPOSITORY
10.0000 mg | RECTAL | Status: DC
Start: 2023-07-28 — End: 2023-07-29
  Filled 2023-07-28: qty 1

## 2023-07-28 NOTE — Nurses Notes (Signed)
 Patient reassessment completed. No change from previous assessment. VSS. Patient is resting in bed, call bell is within reach and 3 side rails are up. No s/s of distress or complaints. Will continue to monitor patient closely at this time.     Nestora Constant, RN  07/28/2023 04:14

## 2023-07-28 NOTE — Nurses Notes (Signed)
 Report received from day shift RN. Kardex, MAR, and labs reviewed. VSS. Assessment completed per flowsheet. Patient is resting in bed, call bell is within reach and 3 side rails are up. No s/s of distress or complaints. Will continue to monitor patient closely at this time.    Nestora Constant, RN  07/28/2023 19:22

## 2023-07-28 NOTE — Progress Notes (Signed)
 [facility]  Thoracic Surgery   Progress Note      Elizabeth [name], Elizabeth [name]  Date of Admission:  07/24/2023  Date of Birth:  Aug 01, 1970    Hospital Day:  LOS: 4 days   Date of Service:  07/28/2023    Subjective   No acute events overnight. Patient sitting on bedside commode during rounds so interaction was brief. Overall feeling well.     Outputs:  Abdominal JP drain: 110cc  Left chest tube: 160cc  PEG: Capped    Current Diet:    MNT PROTOCOL FOR DIETITIAN  DIET FULL LIQUID    Current Medications:  acetaminophen  (TYLENOL ) tablet, 650 mg, Oral, Q4H PRN  Correction/SSIP insulin  lispro 100 units/mL injection, 2-9 Units, Subcutaneous, 4x/day AC  D5W 250 mL flush bag, , Intravenous, Q15 Min PRN  dextrose  (GLUTOSE) 40% oral gel, 15 g, Oral, Q15 Min PRN  dextrose  50% (0.5 g/mL) injection - syringe, 12.5 g, Intravenous, Q15 Min PRN  fosphenytoin  (CEREBYX ) 200 mg in NS 50 mL IVPB, 200 mg, Intravenous, Q8H  glucagon  injection 1 mg, 1 mg, IntraMUSCULAR, Once PRN  heparin  25,000 units in 1/2 NS 250 mL infusion, 18 Units/kg/hr (Adjusted), Intravenous, Continuous  hydrALAZINE (APRESOLINE) injection 10 mg, 10 mg, Intravenous, Q6H PRN  NS 250 mL flush bag, , Intravenous, Q15 Min PRN  NS bolus infusion 40 mL, 40 mL, Intravenous, Once PRN  NS flush syringe, 2-6 mL, Intracatheter, Q8HRS  NS flush syringe, 2-6 mL, Intracatheter, Q1 MIN PRN  pantoprazole (PROTONIX) 40 mg in NS 10 mL injection, 40 mg, Intravenous, Daily  potassium & sodium phosphate  (K PHOS NEUTRAL) tablet, 250 mg, Oral, 4x/day PC      Vital Signs:  Temp (24hrs) Max:36.9 C (98.5 F)  Temperature: 36.8 C (98.2 F)  BP (Non-Invasive): 125/72  MAP (Non-Invasive): 79 mmHG  Heart Rate: (!) 102  Respiratory Rate: 18  SpO2: 95 %  Base (Admission) Weight:  Weight: 83.2 kg (183 lb 6.8 oz)  Weight:  Weight: 83.3 kg (183 lb 10.3 oz)    Physical Exam:  General: no acute distress, appears stated age  Head: normocephalic, atraumatic  Chest: clear, normal excursion, left chest tube  in place with serosanguinous output   Heart: RRR  Abdomen: soft, non-distended, abdominal JP drain in place with serosanguinous output  Extremeties: no edema, cyanosis  Skin: normal turgor, non-icteric  Neuro: no focal neurological deficit    Labs:  I have reviewed all pertinent lab results as below:  Results for orders placed or performed during the hospital encounter of 07/24/23 (from the past 24 hours)   POC BLOOD GLUCOSE (RESULTS)   Result Value Ref Range    GLUCOSE, POC 104 65 - 125 mg/dl   POC BLOOD GLUCOSE (RESULTS)   Result Value Ref Range    GLUCOSE, POC 95 65 - 125 mg/dl   BASIC METABOLIC PANEL   Result Value Ref Range    SODIUM 138 136 - 145 mmol/L    POTASSIUM 3.9 3.5 - 5.1 mmol/L    CHLORIDE 105 96 - 111 mmol/L    CO2 TOTAL 23 22 - 30 mmol/L    ANION GAP 10 4 - 13 mmol/L    CALCIUM  8.3 (L) 8.6 - 10.2 mg/dL    GLUCOSE 87 65 - 874 mg/dL    BUN 10 8 - 25 mg/dL    CREATININE 9.29 9.39 - 1.05 mg/dL    BUN/CREA RATIO 14 6 - 22    ESTIMATED GFR - FEMALE >90 >=60  mL/min/BSA   PHOSPHORUS   Result Value Ref Range    PHOSPHORUS 1.9 (L) 2.4 - 4.7 mg/dL   MAGNESIUM    Result Value Ref Range    MAGNESIUM  2.0 1.8 - 2.6 mg/dL   CBC   Result Value Ref Range    WBC 10.3 3.7 - 11.0 x10^3/uL    RBC 3.64 (L) 3.85 - 5.22 x10^6/uL    HGB 11.4 (L) 11.5 - 16.0 g/dL    HCT 65.2 (L) 65.1 - 46.0 %    MCV 95.3 78.0 - 100.0 fL    MCH 31.3 26.0 - 32.0 pg    MCHC 32.9 31.0 - 35.5 g/dL    RDW-CV 86.4 88.4 - 84.4 %    PLATELETS 216 150 - 400 x10^3/uL    MPV 8.8 8.7 - 12.5 fL   POC BLOOD GLUCOSE (RESULTS)   Result Value Ref Range    GLUCOSE, POC 93 65 - 125 mg/dl   CBC   Result Value Ref Range    WBC 9.7 3.7 - 11.0 x10^3/uL    RBC 3.64 (L) 3.85 - 5.22 x10^6/uL    HGB 11.7 11.5 - 16.0 g/dL    HCT 65.4 (L) 65.1 - 46.0 %    MCV 94.8 78.0 - 100.0 fL    MCH 32.1 (H) 26.0 - 32.0 pg    MCHC 33.9 31.0 - 35.5 g/dL    RDW-CV 86.3 88.4 - 84.4 %    PLATELETS 224 150 - 400 x10^3/uL    MPV 8.8 8.7 - 12.5 fL   PTT (PARTIAL THROMBOPLASTIN TIME)   Result  Value Ref Range    APTT 29.3 24.3 - 37.6 seconds   POC BLOOD GLUCOSE (RESULTS)   Result Value Ref Range    GLUCOSE, POC 86 65 - 125 mg/dl     Radiology:  (images and reports personally reviewed):    Chest x-ray 07/28/23:  IMPRESSION:  1.Left thoracostomy tube is stable in position.  2.Cardiomediastinal silhouette is enlarged, unchanged.  3.Diminished lung volumes bilaterally with prominent lung vasculature.  4.There are left retrocardiac opacities obscuring the left hemidiaphragm and silhouette of the descending thoracic aorta which may represent left pleural effusion with or without underlying atelectasis or consolidation of left lower lobe.  5.No significant pleural effusions. No perceptible pneumothorax.  6.Osseous structures are stable.    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 OR 6/20 for robotic-assisted diaphragm hernia repair, gastropexy, and PEG placement.    PLAN:  Left diaphragmatic hernia s/p robotic-assisted diaphragm hernia repair, gastropexy, and PEG placement on 6/20   -Continue full liquid diet today   -Clamp PEG tube    -Vent tube q6hr or as needed   -Abdominal JP drain to bulb suction   -Left chest tube to -20 suction   -Okay from our standpoint to start heparin  drip today   -AM labs and CXR    Acute pulmonary embolism, dx 1 week ago  - No evidence of R heart strain  - Holding home Eliquis   - Continue heparin  gtt    Chronic Medical Conditions     Obstructive Sleep Apnea    - Continue holding home CPAP given recent surgery     Gastroesophageal Reflux Disease    - Continue home Pepcid     Hypertension  Hyperlipidemia    - Holding home Losartan    - Hydralazine 10mg   Q6h PRN  -  Continue home Lipitor     Type II Diabetes Mellitus   - Holding home Ozempic     - SSI     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    Lauraine Donning, PA-C          I both saw and examined the patient yesterday in concert with Lauraine Donning, PA-C and the resident team. Any pertinent interval imaging studies and labs were personally reviewed. See her note for details.  I agree with the assessment and plan as outlined. Any exceptions or additions are edited/noted.    Cornel Likes, MD

## 2023-07-28 NOTE — Care Management Notes (Addendum)
 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 PT/OT to evaluate when appropriate.    MSW put in task for benefit check for IPR, SNF, HH, and DME for pt.    Discharge Plan:  Home vs home with Home Health    The patient will continue to be evaluated for developing discharge needs.     Case Manager: Annabella Novak, SOCIAL WORKER  Phone: [phone]

## 2023-07-28 NOTE — Care Plan (Signed)
 [facility]  Rehabilitation Services  Physical Therapy Initial Evaluation    Patient Name: Elizabeth [name]  Date of Birth: Nov 28, 1970  Height: Height: 160 cm (5' 3)  Weight: Weight: 83.3 kg (183 lb 10.3 oz)  Room/Bed: 14/A  Payor: BLUE CROSS BLUE SHIELD / Plan: [place] [employer] PPO / Product Type: PPO /     Assessment:      Elizabeth [name] tolerated PT evaluation fairly this date.  She presents with impaired alertness, strength, balance, mobility, and endurance.  Currently requires Ax2 to safely transfer bed to chair.  Pt will required ongoing inpatient PT at an IRF vs SNF following hospital d/c, depending on exercise tolerance at time of d/c    Discharge Needs:   Equipment Recommendation: TBD     Discharge Disposition: inpatient rehabilitation facility, skilled nursing facility    JUSTIFICATION OF DISCHARGE RECOMMENDATION   Based on current diagnosis, functional performance prior to admission, and current functional performance, this patient requires continued PT services in inpatient rehabilitation facility, skilled nursing facility in order to achieve significant functional improvements in these deficit areas: aerobic capacity/endurance, arousal, attention, and cognition, gait, locomotion, and balance.    Plan:   Current Intervention: balance training, bed mobility training, gait training, neuromuscular re-education, stair training, strengthening, transfer training  To provide physical therapy services minimum of 2x/week  for duration of until discharge.    The risks/benefits of therapy have been discussed with the patient/caregiver and he/she is in agreement with the established plan of care.       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 OR 6/20 for robotic-assisted diaphragm hernia repair, gastropexy, and PEG placement.   Medical Lines PIV Line;Telemetry;Chest Tube   Respiratory Status room air   Existing Precautions/Restrictions fall precautions;full code   Mutuality/Individual Preferences   Anxieties, Fears or Concerns concerned about feeling poorly   Individualized Care Needs OOB pivot with Ax2 or SaraStedy Ax1   Plan of Care Reviewed With patient   Living Environment   Lives With child(ren), adult;child(ren), dependent   Living Arrangements apartment   Living Environment Comment Lives with daughter with autism and adult son who provides care   Functional Level Prior   Ambulation 1 - assistive equipment   Transferring 0 - independent   Toileting 0 - independent   Bathing 1 - assistive equipment   Dressing 0 - independent   Eating 0 - independent   Communication 0 - understands/communicates without difficulty   Swallowing 0-->swallows foods/liquids without difficulty   Prior Functional Level Comment Pt reports she has been ambulatory at home since d/c from rehab but unclear timeline   Pre Treatment Status   Pre Treatment Patient Status Patient standing at bedside   Support Present Pre Treatment  Nurse present;Clinical assistant present   Communication Pre Treatment  Nurse   Cognition   Behavior/Mood Observations cooperative;distractible;lethargic   Orientation Status  oriented to;person;place;situation   Attention needs re-direction   Follows Commands increased processing time needed;delayed response/completion;initiation impaired   Comment Impaired short-term memory and recall of recent events   Vital Signs   Vitals Comment VSS   Pain Assessment   Pre/Posttreatment Pain Comment  reports incisional pain   RUE Assessment   RUE Strength Grossly 3+/5   LUE Assessment   LUE Strength Grossly 3+/5   RLE Assessment   RLE Assessment X-Exceptions   RLE Strength Grossly 3/5   LLE Assessment   LLE Assessment X-Exceptions   LLE Strength Grossly 3/5   Bed Mobility   Sit to Supine, Independence minimum assist (75% patient effort)   Comment assisted with raising LEs into bed   Safety Issues decreased use of arms for pushing/pulling;decreased use of legs for bridging/pushing;impaired trunk control for bed mobility   Impairments balance impaired;coordination impaired;endurance;strength decreased;pain   Transfer Assessment/Treatment   Sit-Stand Independence minimum assist (75% patient effort)   Stand-Sit Independence minimum assist (75% patient effort)   Sit-Stand-Sit, Assist Device side by side   Chair-Bed Independence minimum assist (75% patient effort)   Bed-Chair-Bed Assist Device side by side   Toilet Transfer Independence minimum assist (75% patient effort)   Toilet Transfer Assist Device side by side   Transfer Impairments strength decreased;pain;endurance;balance impaired;ROM decreased   Gait Assessment/Treatment   Independence  minimum assist (75% patient effort)   Assistive Device  side by side   Distance in Feet 3' bed to chair   Gait Speed slow and labored   Deviations  cadence decreased;double stance time increased;weight-shifting ability decreased   Safety Issues  step length decreased;weight-shifting ability decreased   Impairments  balance impaired;endurance;pain;strength decreased   Balance   Comment side by side assist   Sitting Balance: Static fair balance   Sitting, Dynamic (Balance) fair balance   Sit-to-Stand Balance fair - balance   Standing Balance: Static fair - balance   Standing Balance: Dynamic poor + balance   Systems Impairment Contributing to Balance Disturbance musculoskeletal   Identified Impairments Contributing to Balance Disturbance pain;decreased strength   Post Treatment  Status   Post Treatment Patient Status Patient supine in bed;Call light within reach   Support Present Post Treatment  Nurse present   Communication Post Treatement Nurse   Communication Post Treatment Comment pt performance   Plan of Care Review   Plan Of Care Reviewed With patient   Basic Mobility Am-PAC/6Clicks Score (APPROVED Staff)   Turning in bed without bedrails 2   Lying on back to sitting on edge of flat bed 2   Moving to and from a bed to a chair 2   Standing up from chair 3   Walk in room 2   Climbing 3-5 steps with railing 2   6 Clicks Raw Score total 13   Standardized (t-scale) score 33.99   Patient Mobility Goal (JHHLM) 4- Move to chair 3X/day   Exercise/Activity Level Performed 4- Transferred to chair/commode   Physical Therapy Clinical Impression   Assessment Elizabeth [name] tolerated PT evaluation fairly this date.  She presents with impaired alertness, strength, balance, mobility, and endurance.  Currently requires Ax2 to safely transfer bed to chair.  Pt will required ongoing inpatient PT at an IRF vs SNF following hospital d/c, depending on exercise tolerance at time of d/c   Patient/Family Goals Statement to get well   Criteria for Skilled Therapeutic yes   Pathology/Pathophysiology Noted musculoskeletal   Impairments Found (describe specific impairments) aerobic capacity/endurance;arousal, attention, and  cognition;gait, locomotion, and balance   Rehab Potential good   Therapy Frequency minimum of 2x/week   Predicted Duration of Therapy Intervention (days/wks) until discharge   Anticipated Equipment Needs at Discharge (PT) TBD   Anticipated Discharge Disposition inpatient rehabilitation facility;skilled nursing facility   Evaluation Complexity Justification   Patient History: Co-morbidity/factors that impact Plan of Care Surgical procedure: causing pain &/or impaired function   Examination Components Range of motion;Strength;Balance;Bed mobility;Transfers;Ambulation   Presentation Evolving: Symptoms,  complaints, characteristics of condition changing &/or cognitive deficits present   Clinical Decision Making Moderate complexity   Evaluation Complexity Moderate complexity   Care Plan Goals   PT Rehab Goals Bed Mobility Goal;Gait Training Goal;Transfer Training Goal;Stairs Training Goal   Bed Mobility Goal   Bed Mobility Goal, Date Established 07/28/23   Bed Mobility Goal, Time to Achieve by discharge   Bed Mobility Goal, Activity Type all bed mobility activities   Bed Mobility Goal, Independence Level independent   Gait Training  Goal, Distance to Achieve   Gait Training  Goal, Date Established 07/28/23   Gait Training  Goal, Time to Achieve by discharge   Gait Training  Goal, Independence Level modified independence   Gait Training  Goal, Assist Device least restricted assistive device   Gait Training  Goal, Distance to Achieve 250'   Stairs Training Goal   Stairs Training Goal, Date Established 07/28/23   Stairs Training Goal, Time to Achieve by discharge   Stairs Training Goal, Independence Level modified independence   Stairs Training Goal, Assist Device least restrictive assistive device   Stairs Training Goal, Number of Stairs to Achieve 12   Transfer Training Goal   Transfer Training Goal, Date Established 07/28/23   Transfer Training Goal, Time to Achieve by discharge   Transfer Training Goal, Activity Type all transfers   Transfer Training Goal, Independence Level modified independence   Transfer Training Goal, Assist Device least restrictive assistive device   Planned Therapy Interventions, PT Eval   Planned Therapy Interventions (PT) balance training;bed mobility training;gait training;neuromuscular re-education;stair training;strengthening;transfer training       Therapist:   Damien Elvie Theotis Dyane, PT   Pager #: [phone]

## 2023-07-28 NOTE — Care Plan (Signed)
 07/28/23 1010   Rehab Session   Document Type rehab contact note   OT Visit Date 07/28/23   Daily Activity AM-PAC/6-clicks Score   Patient Mobility Barrier Patient participating in other care at bedside         Kelly Kil, OTR/L   Pager [phone]

## 2023-07-28 NOTE — Care Plan (Signed)
 [facility]  Rehabilitation Services  Occupational Therapy Initial Evaluation    Patient Name: Elizabeth [name]  Date of Birth: 06-08-1970  Height: Height: 160 cm (5' 3)  Weight: Weight: 83.3 kg (183 lb 10.3 oz)  Room/Bed: 14/A  Payor: BLUE CROSS BLUE [employer]IELD / Plan: [place] [employer] PPO / Product Type: PPO /     Assessment:   Ms. Leichter tolerated OT evaluation fair in CVICU. She was fatigued from a lot of activity today, tolerating pivot transfer from bedside commode with side by side minA and toileting tasks with maxA. She presents with impaired cognition, balance, activity tolerance and overall strength limiting ability to complete basic ADLs. She is non-ambulatory at this time but at recent baseline, following dc from rehab was ambulating community distances. OT strongly recommends return to rehab based on current performance at this time as she has limited family support and is far from recent ambulatory baseline.       Discharge Needs:   Equipment Recommendation: none anticipated    Discharge Disposition: skilled nursing facility, inpatient rehabilitation facility    JUSTIFICATION OF DISCHARGE RECOMMENDATION   Based on current diagnosis, functional performance prior to admission, and current functional performance, this patient requires continued OT services in skilled nursing facility, inpatient rehabilitation facility  in order to achieve significant functional improvements.    Plan:   Current Intervention: ADL retraining, IADL retraining, balance training, bed mobility training, endurance training, cognitive retraining, strengthening, stretching, transfer training, therapeutic exercise    To provide Occupational therapy services 1x/day, minimum of 2x/week, until discharge.       The risks/benefits of therapy have been discussed with the patient/caregiver and he/she is in agreement with the established plan of care.       Subjective & Objective        07/28/23 1542   Therapist Pager   OT Assigned/ Pager  # Kelly [phone]   Rehab Session   Document Type evaluation   OT Visit Date 07/28/23   Total OT Minutes: 14   Patient Effort adequate   Symptoms Noted During/After Treatment fatigue   General Information   Patient Profile Reviewed yes   Onset of Illness/Injury or Date of Surgery 07/26/23   Pertinent History of Current Functional Problem Elizabeth [name] is a 53 y.o. 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 Pt presented to outside hospital 6/11 with acute chest pain and workup demonstrated new segmental and subsegmental PE. A new stomach containing diaphragmatic hernia with air fluid levels was present on this scan. She was admitted and initiated on heparin  gtt and transitioned to Eliquis  for discharge on 6/14. She re-presented to outside hospital for worsening chest pain and CT demonstrated increased size of stomach containing diaphragmatic hernia with air fluid levels. Transferred to [facility] for further management. Pt currently reports pain from the NGT. Denies abdominal pain, chest pain, nausea,  SOB, fevers or chills. She denies any difficulty with food intake and hasn't had any vomiting. Last dose of Eliquis  6/18 morning. PSH includes open left nephrectomy 06/19/23 and remote laparoscopic cholecystectomy.    Medical Lines PIV Line;Telemetry;Chest Tube   Respiratory Status room air   Existing Precautions/Restrictions fall precautions;full code   Pre Treatment Status   Pre Treatment Patient Status Patient standing at bedside   Support Present Pre Treatment  Nurse  present;Clinical assistant present   Communication Pre Treatment  Nurse   Mutuality/Individual Preferences   Individualized Care Needs OOB with pivot x2 vs Camie Ip x1   Plan of Care Reviewed With patient   Living Environment   Lives With child(ren), adult;child(ren),  dependent   Living Arrangements apartment   Home Accessibility stairs to enter home;tub/shower is not walk in   Living Environment Comment Lives with daughter with autism and adult son who provides care   Functional Level Prior   Ambulation 1 - assistive equipment   Transferring 0 - independent   Toileting 0 - independent   Bathing 1 - assistive equipment   Dressing 0 - independent   Eating 0 - independent   Communication 0 - understands/communicates without difficulty   Swallowing 0-->swallows foods/liquids without difficulty   Prior Functional Level Comment Pt is a poor historian at this time. She reports being ambulatory and able to care for self since recent dc from rehab. Has walker and shower chair.   Self-Care   Usual Activity Tolerance moderate   Current Activity Tolerance moderate   Vital Signs   Post-treatment Heart Rate (beats/min) 112   Vitals Comment VSS on RA   Pain   Additional Documentation Pain Scale: Numbers Pre/Post-Treatment (Group)   Pain Assessment   Pre/Posttreatment Pain Comment abdominal incisional pain; not numerically rated. RN aware   Coping/Psychosocial Response Interventions   Plan Of Care Reviewed With patient   Cognition   Behavior/Mood Observations cooperative;distractible   Attention needs re-direction   Follows Commands delayed response/completion;increased processing time needed;initiation impaired   Comment Pt slow to respond to speech. Required increased time for conservation but speech mostly approrpiate. Impaired STM and recollection of recent events.   RUE Assessment   RUE Assessment X- Exceptions   RUE Strength 3+/5 grossly   LUE Assessment   LUE Assessment X-Exceptions   LUE Strength 3+/5 grossly   Mobility Assessment/Training   Mobility Comment UTA due to fatigue with transfer   Bed Mobility   Sit to Supine, Independence minimum assist (75% patient effort)   Comment assist for LEs back to bed   Impairments postural control impaired;strength decreased;endurance;balance  impaired   Transfer Assessment/Treatment   Sit-Stand Independence minimum assist (75% patient effort)   Stand-Sit Independence minimum assist (75% patient effort)   Sit-Stand-Sit, Assist Device side by side   Chair-Bed Independence minimum assist (75% patient effort);2 person assist required   Toilet Transfer Independence minimum assist (75% patient effort);knee block   Toilet Transfer Assist Device side by side   Transfer Impairments strength decreased;pain;endurance;balance impaired;ROM decreased   Toileting Assessment/Training   Assistive Devices bedside commode   Position sitting;standing   TOILETING ASSESSED Adjust clothing prior;Adjust clothing after;Perineal hygiene   Independence Level  maximum assist (25% patient effort)   Impairments activity tolerance impaired;cognition impaired;balance impaired;postural control impaired;strength decreased   Comment assist for peri hygiene; has been having urinary retention since foley removal causing discomfort   Balance   Comment side by side assist   Sitting Balance: Static fair balance   Sitting, Dynamic (Balance) fair balance   Sit-to-Stand Balance fair - balance   Standing Balance: Static fair - balance   Standing Balance: Dynamic poor + balance   Post Treatment Status   Post Treatment Patient Status Patient supine in bed;Call light within reach;Telephone within reach   Support Present Post Treatment  Nurse present   Film/video editor Nurse   Care Plan Goals   OT Rehab Goals Grooming Goal;LB Dressing  Goal;Toileting Goal;Transfer Training Goal 2;Bed Mobility Goal   Bed Mobility Goal   Bed Mobility Goal, Date Established 07/28/23   Bed Mobility Goal, Time to Achieve by discharge   Bed Mobility Goal, Activity Type all bed mobility activities   Bed Mobility Goal, Independence Level independent   Grooming Goal   Grooming Goal, Date Established 07/28/23   Grooming Goal, Time to Achieve by discharge   Grooming Goal, Activity Type all grooming tasks   Grooming  Goal, Independence  independent   Grooming Goal, Position standing   Grooming Goal, Additional Goal >5 minutes; VSS   LB Dressing Goal   LB Dressing Goal, Date Established 07/28/23   LB Dressing Goal, Time to Achieve by discharge   LB Dressing Goal, Activity Type all lower body dressing tasks   LB Dressing Goal, Independence Level modified independence   Toileting Goal   Toileting Goal, Date Established 07/28/23   Toileting Goal, Time to Achieve by discharge   Toileting Goal, Activity Type all toileting tasks   Toileting Goal, Independence Level independent   Transfer Training Goal 2   Transfer Training Goal, Date Established 07/28/23   Transfer Training Goal, Time to Achieve by discharge   Transfer Training Goal, Activity Type all transfers   Transfer Training Goal, Independence Level modified independence   Planned Therapy Interventions, OT Eval   Planned Therapy Interventions ADL retraining;IADL retraining;balance training;bed mobility training;endurance training;cognitive retraining;strengthening;stretching;transfer training;therapeutic exercise   Clinical Impression   Functional Level at Time of Session Ms. Jamison tolerated OT evaluation fair in CVICU. She was fatigued from a lot of activity today, tolerating pivot transfer from bedside commode with side by side minA. She presents with impaired cognition, balance, activity tolerance and overall strength limiting ability to complete basic ADLs. She is non-ambulatory at this time but at recent baseline, following dc from rehab was ambulating community distances. OT strongly recommends return to rehab based on current performance at this time.   Criteria for Skilled Therapeutic Interventions Met (OT) yes   Rehab Potential good   Therapy Frequency 1x/day;minimum of 2x/week   Predicted Duration of Therapy until discharge   Anticipated Equipment Needs at Discharge none anticipated   Anticipated Discharge Disposition skilled nursing facility;inpatient rehabilitation  facility   Highest level of Mobility score   Exercise/Activity Level Performed 5- Static standing>1 minute   Evaluation Complexity Justification   Occupational Profile Review Expanded review   Performance Deficits Strength;Endurance;Balance;Mobility;Attention;Safety awareness;5+ deficits   Clinical Decision Making Moderate analytic complexity   Evaluation Complexity Moderate   Functional Reporting   Functional Limitations were determined by: Professional clinical judgement       Therapist:   Kelly Kil, OT   Pager #: [phone]

## 2023-07-28 NOTE — Nurses Notes (Signed)
 Patient reassessment completed. No change from previous assessment. VSS. Patient is resting in bed, call bell is within reach and 3 side rails are up. No s/s of distress or complaints. Will continue to monitor patient closely at this time.    Nestora Constant, RN  07/28/2023

## 2023-07-28 NOTE — Progress Notes (Signed)
 [facility]  HVI Critical Care Consult/Progress Note    Elizabeth [name], Elizabeth [name], 53 y.o. female  Date of Birth: 01-17-1971  Medical Record Number:  [id number]   Inpatient Admission Date: 07/24/2023   Hospital Day:  LOS: 4 days     Chief Complaint: Post-op medical management    History of Present Illness: Per previous provider Narely Nobles is a 53 y.o. 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 Pt presented to outside hospital 6/11 with acute chest pain and workup demonstrated new segmental and subsegmental PE. A new stomach containing diaphragmatic hernia with air fluid levels was present on this scan. She was admitted and initiated on heparin  gtt and transitioned to Eliquis  for discharge on 6/14. She re-presented to outside hospital for worsening chest pain and CT demonstrated increased size of stomach containing diaphragmatic hernia with air fluid levels. Transferred to [facility] for further management. Pt currently reports pain from the NGT. Denies abdominal pain, chest pain, nausea,  SOB, fevers or chills. She denies any difficulty with food intake and hasn't had any vomiting. Last dose of Eliquis  6/18 morning. PSH includes open left nephrectomy 06/19/23 and remote laparoscopic cholecystectomy.        POD #/ Procedure: 6/21:   Robotic diaphragm hernia repair, gastropexy, PEG tube placement      Hospital Course/Major events:  6/21:  arrived to CVICU extubated on no gtts. Plan per TS to be strict NPO.   6/22:  cap G tube.  Start CLD.  Transfer to SDU.  6/23:  SDU     Past Medical History: Past Medical History:   Diagnosis Date    Anemia     Cancer (CMS HCC)     brain tumor 8/15 with chemo and radiation    Chest pain, unspecified type 07/17/2023    CPAP (continuous positive airway pressure) dependence     CVA  (cerebrovascular accident)     mini during brain surgery per pt    Diabetes mellitus, type 2     Disorder of thyroid     Dyspnea, unspecified type 07/17/2023    Epileptic seizure (CMS HCC)     Essential hypertension     History of kidney surgery 06/2023    Pulmonary embolism 07/16/2023    Sleep apnea        Past Surgical History: Past Surgical History:   Procedure Laterality Date    BRAIN SURGERY      HX CHOLECYSTECTOMY        Social History:  Social History     Socioeconomic History    Marital status: Divorced     Spouse name: Not on file    Number of children: Not on file    Years of education: Not on file    Highest education level: Not on file   Occupational History    Not on file   Tobacco Use    Smoking status: Never    Smokeless tobacco: Never   Vaping Use    Vaping status: Never Used   Substance and Sexual Activity    Alcohol use: Never    Drug use: Never    Sexual activity: Not on file   Other Topics Concern    Ability to Walk 1 Flight of Steps without SOB/CP Yes    Routine Exercise No    Ability  to Walk 2 Flight of Steps without SOB/CP No    Unable to Ambulate Not Asked    Total Care Not Asked    Ability To Do Own ADL's Yes    Uses Walker Not Asked    Other Activity Level Not Asked    Uses Cane Not Asked   Social History Narrative    Not on file     Social Determinants of Health     Financial Resource Strain: Low Risk  (07/24/2023)    Financial Resource Strain     SDOH Financial: No   Transportation Needs: High Risk (07/24/2023)    Transportation Needs     SDOH Transportation: Yes, it has kept me from medical appointments or from getting my medications   Social Connections: Low Risk  (07/24/2023)    Social Connections     SDOH Social Isolation: 5 or more times a week   Intimate Partner Violence: Not on file   Housing Stability: Low Risk  (07/24/2023)    Housing Stability     SDOH Housing Situation: I have housing.     SDOH Housing Worry: No      Family History: Family Medical History:       Problem Relation  (Age of Onset)    Arthritis-osteo Mother    Breast Cancer Paternal Grandmother    Heart Disease Father    Hypertension (High Blood Pressure) Mother    No Known Problems Sister, Brother, Maternal Grandmother, Maternal Grandfather, Paternal Grandfather, Daughter, Son, Maternal Aunt, Maternal Uncle, Paternal Aunt, Paternal Uncle, Other             Allergies: Allergies[1]   ROS:  no fever, chills, n/v, abd pain, cough, wheeze, chest pain.  Remaining ROS neg.        Problem List: Problem List[2]    Medications:  Scheduled Meds: Correction/SSIP insulin  lispro 100 units/mL injection, 2-9 Units, 4x/day AC  fosphenytoin  (CEREBYX ) IVPB maintenance dose, 200 mg, Q8H  heparin , 80 Units/kg (Adjusted), Once  NS flush, 2-6 mL, Q8HRS  pantoprazole, 40 mg, Daily  k phos di & mono-sod phos mono, 250 mg, 4x/day PC      IV Infusions: heparin       PRN:  D5W, , Q15 Min PRN  dextrose , 15 g, Q15 Min PRN  dextrose , 12.5 g, Q15 Min PRN  glucagon  (GLUCAGEN) injection, 1 mg, Once PRN  hydrALAZINE (APRESOLINE) injection, 10 mg, Q6H PRN  HYDROmorphone  (DILAUDID ) injection, 0.2 mg, Q4H PRN  NS, , Q15 Min PRN  NS bolus, 40 mL, Once PRN  NS flush, 2-6 mL, Q1 MIN PRN        Last VS:    Temperature: 36.8 C (98.2 F)  Heart Rate: 99  Respiratory Rate: 18  BP (Non-Invasive): 127/73      Comprehensive Physical Exam:    Constitutional: appears chronically ill, moderately obese, appears older than stated age, no distress, and vital signs reviewed  Eyes: Conjunctiva clear., Sclera non-icteric.   ENT: Mouth mucous membranes moist.   Neck: supple, symmetrical, trachea midline  Respiratory: Breathing nonlabored, Clear to auscultation bilaterally.  Left CT to -20sx   Cardiovascular: regular rate and rhythm on the monitor  no murmurs  No JVD  Gastrointestinal: non-distended, Soft, non-tender PEG tube in place  Genitourinary: deferred  Musculoskeletal: AROM and Elizabeth [name]  Integumentary:  Skin warm and dry and ABD incision OTA w/ no signs of infection or drainage    Neuro:  GCS 15      [x] I have reviewed  the patient's vitals signs, the nursing notes, the physician's notes and other progress notes   [x] I have reviewed the laboratory and imaging data      Systems Based Assessment and Plan:    Neurologic:  Data/Assessment CAM: Reviewed   GCS: Reviewed    Diagnosis Incisional pain  Mood Disorder   Hx Brain tumor s/p chemo/radiation and resection, CVA, Seizures,    Course: stable   Plan cont AED  Serial neuro exams  Day / Night hygiene   PT / OT  OOB      Cardiovascular:  Data/Assessment Most Recent Hemodynamics: Reviewed   Cardiovascular support: None     Diagnosis Hypertension  Hx HTN, HLD   Course stable   Plan BP therapeutics, PO  Monitor Electrolytes  Serial hemodynamic checks        Respiratory:  Data/Assessment: CTO #1: Reviewed   CXR: Reviewed   Most recent ABG: Reviewed      Diagnosis 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 repair, gastropexy, PEG tube placement   Recent segmental and subsegmental PE   Hx OSA   Course stable   Plan heparin  gtt   Maintain CT to -20 sx, continue to monitor output   Holding home CPAP per TS      Renal:  Data/Assessment: Reviewed    Diagnosis Hypovolemia , resolved    Hx RCC s/p left nephrectomy 06/19/23   Course stable   Plan Replacing electrolytes prn  I&O      Follows w/ Urology and Oncology outpatient  for recent nephrectomy      Gastrointestinal:  Data/Assessment: Pantoprazole (Protonix)  for stress ulcer prophylaxis   Diet:  PO   Bowel Regimen:     Diagnosis 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 repair, gastropexy, PEG tube placement   Hx GERD   Course  stable   Plan Clear liquids OK per TS  PPI        Endocrine:  Data/Assessment Last three Fingerstick glucose: Reviewed   Last HbA1C: 5.1  Most recent Thyroid results: 1.299     Diagnosis Insulin  dependent diabetes mellitus (IDDM)   Course Unchanged   Plan   Target glucose 140-180     Hematologic:  Data/Assessment:  Reviewed    Diagnosis No active  diagnosis   Course Stable   Plan heparin   For DVT Prophylaxis       Infectious Diseases:  Data/Assessment Tmax last 24 hrs: Temp (24hrs) Max:36.9 C (98.5 F)       Diagnosis No active diagnosis    Course Stable   Plan monitor       Family Communication and Disposition:  Data/Assessment Pt updated on plan of care       I saw and examined the patient at bedside and discussed the patient with the CVICU and Consultant teams.   The care of this patient was in regard to managing the conditions listed above.  The data reviewed and care planning were performed in direct proximity of the patient.  Medications, allergies, vital signs, lab tests, imaging, nursing notes and physician notes have been reviewed.     Marcey Bolk, MD  HVI Critical Care       [1]   Allergies  Allergen Reactions    Ace Inhibitors      Other reaction(s): Angioedema   [2]   Patient Active Problem List  Diagnosis    Left renal mass    Kidney mass    Pulmonary embolism    Chest  pain, unspecified type    Dyspnea, unspecified type    History of nephrectomy    History of astrocytoma    Clear cell renal cell carcinoma, left (CMS HCC)    Pulmonary nodules    Diaphragmatic hernia    Confusion

## 2023-07-28 NOTE — Care Plan (Signed)
 Patient is POD 3 s/p Diaphragmatic hernia repair and PEG tube placement. Patient GCS 4,6,5. Patient is on RA. Oxygen saturations and respirations have remained WDL. Patient is practicing coughing and deep breathing exercises using their pep valve and IS. Patient's heart rhythm remains NS-ST 90's-110. Patient's BP has been running 130-150/70-80  with a MAP of 70. Through the shift the patient remains of the following drips: normosol @ 50. G tube remains clamped. Patient's bowel sounds are audible and normoactive. The patient is on a full liquid diet and is tolerating the diet well. 2BM's this shift. Foley catheter removed yesterday, pt straight cathed once overnight. Urinary retention noted. Discussions with day team about replacing catheter. Chest tubes have put out a total of 80mL this shift draining SR  this shift. Chest tubes and OR dressings were changed this shift per kardex orders. Plan this morning is to go to SDU. Care is ongoing.        Problem: Adult Inpatient Plan of Care  Goal: Plan of Care Review  Outcome: Ongoing (see interventions/notes)  Goal: Patient-Specific Goal (Individualized)  Outcome: Ongoing (see interventions/notes)  Flowsheets (Taken 07/27/2023 1947)  Individualized Care Needs: rest and comfort  Anxieties, Fears or Concerns: none stated that this time  Goal: Absence of Hospital-Acquired Illness or Injury  Outcome: Ongoing (see interventions/notes)  Intervention: Identify and Manage Fall Risk  Recent Flowsheet Documentation  Taken 07/28/2023 0600 by Nestora HERO, RN  Safety Promotion/Fall Prevention:   safety round/check completed   nonskid shoes/slippers when out of bed   motion sensor pad activated   fall prevention program maintained   activity supervised  Taken 07/28/2023 0500 by Nestora HERO, RN  Safety Promotion/Fall Prevention:   safety round/check completed   nonskid shoes/slippers when out of bed   motion sensor pad activated   fall prevention program maintained   activity supervised  Taken  07/28/2023 0400 by Nestora HERO, RN  Safety Promotion/Fall Prevention:   safety round/check completed   nonskid shoes/slippers when out of bed   motion sensor pad activated   fall prevention program maintained   activity supervised  Taken 07/28/2023 0300 by Nestora HERO, RN  Safety Promotion/Fall Prevention:   safety round/check completed   nonskid shoes/slippers when out of bed   motion sensor pad activated   fall prevention program maintained   activity supervised  Taken 07/28/2023 0200 by Nestora HERO, RN  Safety Promotion/Fall Prevention:   safety round/check completed   nonskid shoes/slippers when out of bed   motion sensor pad activated   fall prevention program maintained   activity supervised  Taken 07/28/2023 0100 by Nestora HERO, RN  Safety Promotion/Fall Prevention:   safety round/check completed   nonskid shoes/slippers when out of bed   motion sensor pad activated   fall prevention program maintained   activity supervised  Taken 07/28/2023 0000 by Nestora HERO, RN  Safety Promotion/Fall Prevention:   safety round/check completed   nonskid shoes/slippers when out of bed   motion sensor pad activated   fall prevention program maintained   activity supervised  Taken 07/27/2023 2300 by Nestora HERO, RN  Safety Promotion/Fall Prevention:   safety round/check completed   nonskid shoes/slippers when out of bed   motion sensor pad activated   fall prevention program maintained   activity supervised  Taken 07/27/2023 2200 by Nestora HERO, RN  Safety Promotion/Fall Prevention:   safety round/check completed   nonskid shoes/slippers when out of bed   motion sensor pad activated   fall  prevention program maintained   activity supervised  Taken 07/27/2023 2100 by Nestora HERO, RN  Safety Promotion/Fall Prevention:   safety round/check completed   nonskid shoes/slippers when out of bed   motion sensor pad activated   fall prevention program maintained   activity supervised  Taken 07/27/2023 1947 by Nestora HERO, RN  Safety Promotion/Fall Prevention:    safety round/check completed   nonskid shoes/slippers when out of bed   motion sensor pad activated   fall prevention program maintained   activity supervised  Intervention: Prevent Skin Injury  Recent Flowsheet Documentation  Taken 07/28/2023 0600 by Nestora HERO, RN  Body Position: sitting  Taken 07/27/2023 1947 by Nestora HERO, RN  Body Position: supine, head elevated  Skin Protection:   adhesive use limited   antimicrobial wipes   electrode sites changed   incontinence pads utilized   pulse oximeter probe site changed   preventative decubiti skin protection foam dressing applied/intact   protective decubiti skin protection foam dressing not able to be applied (comment required)   transparent dressing maintained   tubing/devices free from skin contact  Intervention: Prevent and Manage VTE (Venous Thromboembolism) Risk  Recent Flowsheet Documentation  Taken 07/27/2023 1947 by Nestora HERO, RN  Sequential Compression Device (SCDs): Sequential compression device on  VTE Prevention/Management:   ambulation promoted   anticoagulant therapy adjusted  Intervention: Prevent Infection  Recent Flowsheet Documentation  Taken 07/27/2023 1947 by Nestora HERO, RN  Infection Prevention:   barrier precautions utilized   environmental surveillance performed   equipment surfaces disinfected   glycemic control managed   personal protective equipment utilized   promote handwashing   rest/sleep promoted  Goal: Optimal Comfort and Wellbeing  Outcome: Ongoing (see interventions/notes)  Intervention: Provide Person-Centered Care  Recent Flowsheet Documentation  Taken 07/27/2023 1947 by Nestora HERO, RN  Trust Relationship/Rapport:   care explained   choices provided   emotional support provided   empathic listening provided   questions answered   questions encouraged   reassurance provided   thoughts/feelings acknowledged  Goal: Rounds/Family Conference  Outcome: Ongoing (see interventions/notes)     Problem: Wound  Goal: Optimal Coping  Outcome: Ongoing  (see interventions/notes)  Intervention: Support Patient and Family Response  Recent Flowsheet Documentation  Taken 07/27/2023 1947 by Nestora HERO, RN  Family/Support System Care:   support provided   self-care encouraged   presence promoted   involvement promoted  Supportive Measures:   active listening utilized   self-care encouraged   relaxation techniques promoted  Goal: Optimal Functional Ability  Outcome: Ongoing (see interventions/notes)  Intervention: Optimize Functional Ability  Recent Flowsheet Documentation  Taken 07/28/2023 0300 by Nestora HERO, RN  Activity Management: up to bedside commode  Activity Assistance Provided: assistance, 2 people  Taken 07/27/2023 1947 by Nestora HERO, RN  Activity Management: ROM, active encouraged  Activity Assistance Provided: assistance, 2 people  Assistive Device Utilized: team lift  Goal: Absence of Infection Signs and Symptoms  Outcome: Ongoing (see interventions/notes)  Intervention: Prevent or Manage Infection  Recent Flowsheet Documentation  Taken 07/27/2023 1947 by Nestora HERO, RN  Fever Reduction/Comfort Measures:   lightweight bedding   lightweight clothing  Goal: Improved Oral Intake  Outcome: Ongoing (see interventions/notes)  Goal: Optimal Pain Control and Function  Outcome: Ongoing (see interventions/notes)  Intervention: Prevent or Manage Pain  Recent Flowsheet Documentation  Taken 07/27/2023 1947 by Nestora HERO, RN  Sleep/Rest Enhancement:   awakenings minimized   consistent schedule promoted   noise level reduced  regular sleep/rest pattern promoted   relaxation techniques promoted   room darkened  Goal: Skin Health and Integrity  Outcome: Ongoing (see interventions/notes)  Intervention: Optimize Skin Protection  Recent Flowsheet Documentation  Taken 07/28/2023 0600 by Nestora HERO, RN  Head of Bed [facility]) Positioning: HOB at 60-90 degrees  Taken 07/28/2023 0300 by Nestora HERO, RN  Activity Management: up to bedside commode  Taken 07/27/2023 1947 by Nestora HERO, RN  Pressure  Reduction Techniques:   Heels elevated off of the bed   Patient turned q 2 hours   Mobility is maximized   Moisture, shear and nutrition are maximized   30 degree lateral position utilized   Supplemented with small shifts   Frequent weight shifting encouraged   Pressure points protected  Pressure Reduction Devices:   Heel offloading device utilized   Pressure redistributing mattress utilized   Specialty bed/mattress utilized   Repositioning wedges/pillows utilized   Sacral dressing utilized if not incontinent of bowel   Pressure reduction chair cushion utilized  Activity Management: ROM, active encouraged  Head of Bed (HOB) Positioning: HOB at 30-45 degrees  Goal: Optimal Wound Healing  Outcome: Ongoing (see interventions/notes)  Intervention: Promote Wound Healing  Recent Flowsheet Documentation  Taken 07/28/2023 0300 by Nestora HERO, RN  Activity Management: up to bedside commode  Taken 07/27/2023 1947 by Nestora HERO, RN  Pressure Reduction Techniques:   Heels elevated off of the bed   Patient turned q 2 hours   Mobility is maximized   Moisture, shear and nutrition are maximized   30 degree lateral position utilized   Supplemented with small shifts   Frequent weight shifting encouraged   Pressure points protected  Pressure Reduction Devices:   Heel offloading device utilized   Pressure redistributing mattress utilized   Specialty bed/mattress utilized   Repositioning wedges/pillows utilized   Sacral dressing utilized if not incontinent of bowel   Pressure reduction chair cushion utilized  Sleep/Rest Enhancement:   awakenings minimized   consistent schedule promoted   noise level reduced   regular sleep/rest pattern promoted   relaxation techniques promoted   room darkened  Activity Management: ROM, active encouraged     Problem: Health Knowledge, Opportunity to Enhance (Adult,Obstetrics,Pediatric)  Goal: Knowledgeable about Health Subject/Topic  Description: Patient will demonstrate the desired outcomes by  discharge/transition of care.  Outcome: Ongoing (see interventions/notes)  Intervention: Enhance Health Knowledge  Recent Flowsheet Documentation  Taken 07/27/2023 1947 by Nestora HERO, RN  Family/Support System Care:   support provided   self-care encouraged   presence promoted   involvement promoted  Supportive Measures:   active listening utilized   self-care encouraged   relaxation techniques promoted     Problem: Skin Injury Risk Increased  Goal: Skin Health and Integrity  Outcome: Ongoing (see interventions/notes)  Intervention: Optimize Skin Protection  Recent Flowsheet Documentation  Taken 07/28/2023 0600 by Nestora HERO, RN  Head of Bed [facility]) Positioning: HOB at 60-90 degrees  Taken 07/28/2023 0300 by Nestora HERO, RN  Activity Management: up to bedside commode  Taken 07/27/2023 1947 by Nestora HERO, RN  Pressure Reduction Techniques:   Heels elevated off of the bed   Patient turned q 2 hours   Mobility is maximized   Moisture, shear and nutrition are maximized   30 degree lateral position utilized   Supplemented with small shifts   Frequent weight shifting encouraged   Pressure points protected  Pressure Reduction Devices:   Heel offloading device utilized   Pressure redistributing mattress utilized  Specialty bed/mattress utilized   Repositioning wedges/pillows utilized   Sacral dressing utilized if not incontinent of bowel   Pressure reduction chair cushion utilized  Skin Protection:   adhesive use limited   antimicrobial wipes   electrode sites changed   incontinence pads utilized   pulse oximeter probe site changed   preventative decubiti skin protection foam dressing applied/intact   protective decubiti skin protection foam dressing not able to be applied (comment required)   transparent dressing maintained   tubing/devices free from skin contact  Activity Management: ROM, active encouraged  Head of Bed (HOB) Positioning: HOB at 30-45 degrees     Problem: Fall Injury Risk  Goal: Absence of Fall and Fall-Related  Injury  Outcome: Ongoing (see interventions/notes)  Intervention: Identify and Manage Contributors  Recent Flowsheet Documentation  Taken 07/27/2023 1947 by Nestora HERO, RN  Self-Care Promotion: independence encouraged  Intervention: Promote Injury-Free Environment  Recent Flowsheet Documentation  Taken 07/28/2023 0600 by Nestora HERO, RN  Safety Promotion/Fall Prevention:   safety round/check completed   nonskid shoes/slippers when out of bed   motion sensor pad activated   fall prevention program maintained   activity supervised  Taken 07/28/2023 0500 by Nestora HERO, RN  Safety Promotion/Fall Prevention:   safety round/check completed   nonskid shoes/slippers when out of bed   motion sensor pad activated   fall prevention program maintained   activity supervised  Taken 07/28/2023 0400 by Nestora HERO, RN  Safety Promotion/Fall Prevention:   safety round/check completed   nonskid shoes/slippers when out of bed   motion sensor pad activated   fall prevention program maintained   activity supervised  Taken 07/28/2023 0300 by Nestora HERO, RN  Safety Promotion/Fall Prevention:   safety round/check completed   nonskid shoes/slippers when out of bed   motion sensor pad activated   fall prevention program maintained   activity supervised  Taken 07/28/2023 0200 by Nestora HERO, RN  Safety Promotion/Fall Prevention:   safety round/check completed   nonskid shoes/slippers when out of bed   motion sensor pad activated   fall prevention program maintained   activity supervised  Taken 07/28/2023 0100 by Nestora HERO, RN  Safety Promotion/Fall Prevention:   safety round/check completed   nonskid shoes/slippers when out of bed   motion sensor pad activated   fall prevention program maintained   activity supervised  Taken 07/28/2023 0000 by Nestora HERO, RN  Safety Promotion/Fall Prevention:   safety round/check completed   nonskid shoes/slippers when out of bed   motion sensor pad activated   fall prevention program maintained   activity supervised  Taken  07/27/2023 2300 by Nestora HERO, RN  Safety Promotion/Fall Prevention:   safety round/check completed   nonskid shoes/slippers when out of bed   motion sensor pad activated   fall prevention program maintained   activity supervised  Taken 07/27/2023 2200 by Nestora HERO, RN  Safety Promotion/Fall Prevention:   safety round/check completed   nonskid shoes/slippers when out of bed   motion sensor pad activated   fall prevention program maintained   activity supervised  Taken 07/27/2023 2100 by Nestora HERO, RN  Safety Promotion/Fall Prevention:   safety round/check completed   nonskid shoes/slippers when out of bed   motion sensor pad activated   fall prevention program maintained   activity supervised  Taken 07/27/2023 1947 by Nestora HERO, RN  Safety Promotion/Fall Prevention:   safety round/check completed   nonskid shoes/slippers when out of bed   motion sensor  pad activated   fall prevention program maintained   activity supervised     Nestora Constant, RN  07/28/2023 06:46

## 2023-07-29 ENCOUNTER — Inpatient Hospital Stay (HOSPITAL_COMMUNITY): Admitting: Radiology

## 2023-07-29 DIAGNOSIS — Z905 Acquired absence of kidney: Secondary | ICD-10-CM

## 2023-07-29 DIAGNOSIS — Z86711 Personal history of pulmonary embolism: Secondary | ICD-10-CM

## 2023-07-29 DIAGNOSIS — I2693 Single subsegmental pulmonary embolism without acute cor pulmonale: Secondary | ICD-10-CM

## 2023-07-29 DIAGNOSIS — I10 Essential (primary) hypertension: Secondary | ICD-10-CM

## 2023-07-29 DIAGNOSIS — Z8673 Personal history of transient ischemic attack (TIA), and cerebral infarction without residual deficits: Secondary | ICD-10-CM

## 2023-07-29 DIAGNOSIS — E119 Type 2 diabetes mellitus without complications: Secondary | ICD-10-CM

## 2023-07-29 DIAGNOSIS — E785 Hyperlipidemia, unspecified: Secondary | ICD-10-CM

## 2023-07-29 LAB — PTT (PARTIAL THROMBOPLASTIN TIME)
APTT: 67.4 s — ABNORMAL HIGH (ref 24.3–37.6)
APTT: 54.1 s — ABNORMAL HIGH (ref 24.3–37.6)
APTT: 63.3 s — ABNORMAL HIGH (ref 24.3–37.6)
APTT: 71.9 s — ABNORMAL HIGH (ref 24.3–37.6)

## 2023-07-29 LAB — BASIC METABOLIC PANEL
ANION GAP: 11 mmol/L (ref 4–13)
BUN/CREA RATIO: 13 (ref 6–22)
BUN: 8 mg/dL (ref 8–25)
CALCIUM: 8.5 mg/dL — ABNORMAL LOW (ref 8.6–10.2)
CHLORIDE: 105 mmol/L (ref 96–111)
CO2 TOTAL: 21 mmol/L — ABNORMAL LOW (ref 22–30)
CREATININE: 0.6 mg/dL (ref 0.60–1.05)
ESTIMATED GFR - FEMALE: >90 mL/min/BSA (ref >=60)
GLUCOSE: 100 mg/dL (ref 65–125)
POTASSIUM: 3.6 mmol/L (ref 3.5–5.1)
SODIUM: 137 mmol/L (ref 136–145)

## 2023-07-29 LAB — URINALYSIS, MACROSCOPIC
BILIRUBIN: NEGATIVE mg/dL
BLOOD: NEGATIVE mg/dL
COLOR: NORMAL
GLUCOSE: NEGATIVE mg/dL
KETONES: 40 mg/dL — AB
NITRITE: NEGATIVE
PH: 6 (ref 5.0–8.0)
PROTEIN: NEGATIVE mg/dL
SPECIFIC GRAVITY: 1.011 (ref 1.005–1.030)
UROBILINOGEN: NEGATIVE mg/dL

## 2023-07-29 LAB — CBC
HCT: 32.2 % — ABNORMAL LOW (ref 34.8–46.0)
HGB: 10.9 g/dL — ABNORMAL LOW (ref 11.5–16.0)
MCH: 31.4 pg (ref 26.0–32.0)
MCHC: 33.9 g/dL (ref 31.0–35.5)
MCV: 92.8 fL (ref 78.0–100.0)
MPV: 9 fL (ref 8.7–12.5)
PLATELETS: 223 10*3/uL (ref 150–400)
RBC: 3.47 10*6/uL — ABNORMAL LOW (ref 3.85–5.22)
RDW-CV: 13.3 % (ref 11.5–15.5)
WBC: 8 10*3/uL (ref 3.7–11.0)

## 2023-07-29 LAB — URINALYSIS, MICROSCOPIC
RBCS: 1 /HPF (ref <6.0)
WBCS: 6 /HPF (ref <11.0)

## 2023-07-29 LAB — POC BLOOD GLUCOSE (RESULTS)
GLUCOSE, POC: 118 mg/dL (ref 65–125)
GLUCOSE, POC: 154 mg/dL — ABNORMAL HIGH (ref 65–125)
GLUCOSE, POC: 148 mg/dL — ABNORMAL HIGH (ref 65–125)
GLUCOSE, POC: 164 mg/dL — ABNORMAL HIGH (ref 65–125)

## 2023-07-29 LAB — PHOSPHORUS: PHOSPHORUS: 2.9 mg/dL (ref 2.4–4.7)

## 2023-07-29 LAB — AMYLASE BODY FLUID: AMYLASE BODY FLUID: 39 U/L

## 2023-07-29 LAB — MAGNESIUM: MAGNESIUM: 1.8 mg/dL (ref 1.8–2.6)

## 2023-07-29 MED ORDER — SENNOSIDES 8.6 MG-DOCUSATE SODIUM 50 MG TABLET
1.0000 | ORAL_TABLET | Freq: Two times a day (BID) | ORAL | Status: DC
Start: 2023-07-29 — End: 2023-08-03
  Administered 2023-07-29: 0 via ORAL
  Administered 2023-07-29: 1 via ORAL
  Administered 2023-07-30: 0 via ORAL
  Administered 2023-07-30: 1 via ORAL
  Administered 2023-07-31 (×2): 0 via ORAL
  Administered 2023-08-01: 1 via ORAL
  Administered 2023-08-01 – 2023-08-02 (×3): 0 via ORAL
  Administered 2023-08-03: 1 via ORAL
  Administered 2023-08-03: 0 via ORAL
  Filled 2023-07-29 (×9): qty 1

## 2023-07-29 MED ORDER — TAMSULOSIN 0.4 MG CAPSULE
0.4000 mg | ORAL_CAPSULE | Freq: Every evening | ORAL | Status: DC
Start: 2023-07-29 — End: 2023-08-03
  Administered 2023-07-29 – 2023-08-03 (×6): 0.4 mg via ORAL
  Filled 2023-07-29 (×7): qty 1

## 2023-07-29 MED ORDER — MAGNESIUM SULFATE 2 GRAM/50 ML (4 %) IN WATER INTRAVENOUS PIGGYBACK
2.0000 g | INJECTION | Freq: Once | INTRAVENOUS | Status: AC
Start: 2023-07-29 — End: 2023-07-29
  Administered 2023-07-29: 0 g via INTRAVENOUS
  Administered 2023-07-29: 2 g via INTRAVENOUS
  Filled 2023-07-29: qty 50

## 2023-07-29 MED ORDER — HEPARIN (PORCINE) 5,000 UNITS/ML BOLUS FOR DOSE ADJUSTMENT
25.0000 [IU]/kg | Freq: Once | INTRAMUSCULAR | Status: AC
Start: 2023-07-29 — End: 2023-07-29
  Administered 2023-07-29: 1500 [IU] via INTRAVENOUS
  Filled 2023-07-29: qty 1

## 2023-07-29 MED ORDER — SENNOSIDES 8.8 MG/5 ML ORAL SYRUP
10.0000 mL | ORAL_SOLUTION | Freq: Two times a day (BID) | ORAL | Status: DC
Start: 2023-07-29 — End: 2023-07-29

## 2023-07-29 MED ORDER — SODIUM BICARBONATE 1 MEQ/ML (8.4 %) INTRAVENOUS SOLUTION
INTRAVENOUS | Status: AC
Start: 2023-07-29 — End: 2023-07-30
  Filled 2023-07-29: qty 100

## 2023-07-29 MED ORDER — LOSARTAN 50 MG TABLET
100.0000 mg | ORAL_TABLET | Freq: Every day | ORAL | Status: DC
Start: 2023-07-29 — End: 2023-08-03
  Administered 2023-07-29 – 2023-08-03 (×6): 100 mg via ORAL
  Filled 2023-07-29 (×6): qty 2

## 2023-07-29 MED ORDER — POTASSIUM CHLORIDE ER 20 MEQ TABLET,EXTENDED RELEASE(PART/CRYST)
20.0000 meq | ORAL_TABLET | ORAL | Status: AC
Start: 2023-07-29 — End: 2023-07-29
  Administered 2023-07-29: 20 meq via ORAL
  Filled 2023-07-29: qty 1

## 2023-07-29 MED ORDER — POTASSIUM CHLORIDE 10 MEQ/100ML IN STERILE WATER INTRAVENOUS PIGGYBACK
10.0000 meq | INJECTION | INTRAVENOUS | Status: DC
Start: 2023-07-29 — End: 2023-07-29

## 2023-07-29 NOTE — Nurses Notes (Signed)
 Patient reassessment completed. No change from previous assessment. VSS. Patient is resting in bed, call bell is within reach and 3 side rails are up. No s/s of distress or complaints. Will continue to monitor patient closely at this time.     Nestora Constant, RN  07/29/2023 04:07

## 2023-07-29 NOTE — OR Surgeon (Signed)
 [facility]                                                     OPERATIVE NOTE     Patient Name: Elizabeth [name], Elizabeth [name] Number: [id number]  Date of Service: July 25, 2023   Date of Birth: 04/06/1970           PREOPERATIVE DIAGNOSES:  1.           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          H/o left renal cell carcinoma s/p open (laparotomy) left radical nephrectomy with IVC resection  3.           H/o brain tumor  4.           Obesity (BMI 32.73 kg/m2)  5.           PE   6.           CVA  7.          DM type 2  8.          Seizures  9.          HTN          POSTOPERATIVE DIAGNOSES:  1.           same     NAME OF PROCEDURE:  1. Flexible esophagogastroduodenoscopy  2. Lysis of adhesions >90 min  3. Robotic assisted repair of left posterior diaphragmatic hernia  4. Robotic-assisted gastropexy (fundophrenicopexy)  5. PEG tube placement     SURGEON:  Cornel Likes, MD    CO-SURGEON:  Lenis Ferries, MD     ASSISTANT(S):  Ethyl Sanes, MD       ANESTHESIA:  General endotracheal anesthesia.     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 Rest of the esophagus was normal except for some healing ulceration just above GE junction likely from NG tube related trauma. Stomach has multiple mucosal sessile polyps. On retroflexion a large portion of fundus was herniating through a small defect in the diaphragm. There was some venous congestion of the herniated mucosa but no overt signs of ischemia. Rest of stomach and D 1 normal      On laparoscopic exploration, there were dense adhesions of omentum and small bowel to the anterior abdominal wall.  Lysis of adhesions took > 90 mins to create enough space to place robotic ports safely.    More than 50% of fundus and entire spleen were found to be herniated into the left chest through a 5 cm defect in the left posterior hemidiaphragm without a hernia sac. The spleen was densely adherent to the posterior lip of the defect. There were dense adhesions in the region of left adrenalectomy medial to the spleen which took additional time to release for a safe dissection. There was  no discernible posterior edge of the diaphragm to anchor a mesh to.  A modifier 22 should be applied here.          INDICATIONS FOR PROCEDURE:  Elizabeth [name] is a 53 y.o. 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 Pt presented to outside hospital 6/11 with acute chest pain and workup demonstrated new segmental and subsegmental PE. A new stomach containing diaphragmatic hernia with air fluid levels was present on this scan. She was admitted and initiated on heparin  gtt and transitioned to Eliquis  for discharge on 6/14. She re-presented to outside hospital for worsening chest pain and CT demonstrated increased size of stomach containing diaphragmatic hernia with air fluid levels. Transferred to [facility] for further management. Pt currently reports pain from the NGT. Denies abdominal pain, chest pain, nausea,  SOB, fevers or chills. She denies any difficulty with food intake and hasn't had any vomiting. Last dose of Eliquis  6/18 morning. PSH includes open left nephrectomy 06/19/23 and remote laparoscopic cholecystectomy.   She was taken for surgery to mitigate chances of gastric ischemia and to ensure a durable repair of the defect.    DESCRIPTION OF PROCEDURE:  The patient was identified and informed consent obtained then was taken from the holding area to  the operating room and placed supine on the operating room table.  General anesthesia established with placement of a single-lumen endotracheal tube.  A surgical pause was undertaken and the adult gastroscope was passed effortlessly into the esophagus and stomach and above findings were noted. The abdomen was prepped and draped in the usual fashion.  A suitable position for entry was chosen just left of  the midline incision and 1/3 of the way cephalad from the umbilicus.  A 5 mm Optiview was used to enter the abdomen without difficulty and initial evaluation showed no injury upon entry. The bed was then placed in steep reverse Trendelenburg position and 3 more robotic 8 mm ports inserted.  The dense adhesions as described above were taken down with a combination of Bovie electrocautery and blunt dissection. An additional 5 mm incision was made just below and to the left of the Xiphoid for a Nathanson retractor that was placed under the left lateral liver segment and fixed in place. 2 additional 8 mm robotic ports were placed in the left and right paraumbilical positions to serve as assistant ports.  The camera port was then swapped to an 8 mm robotic cannula.The robot was then docked and the instruments placed as follows: Cadiere forceps in left, vessel sealer in right and fenestrated grasper as 4th arm.      The stomach was carefully reduced into the abdomen and showed no overt signs of ischemia. The attachments with the diaphragm and spleen were released with vessel sealer. The spleen was them reduced in to the abdomen with a combination of blunt dissection and vessel sealer. There were dense adhesions medially in the adrenalectomy bed that took additional time to release. An additional port was placed in the left lower chest for suction. Since there was no posterior edge of the diaphragm and the pt was slightly hypotensive at this point, we decided to proceed with primary repair of the defect. This was  accomplished with multiple running # 0 V-loc non absorbable sutures which were anchored to the posterior chest wall  and cinched down to close the defect. The stomach was then anchored to the anterior diaphragm with a 2-0 non absorbable V-Loc to serve as gastropexy. This was reinforced with a PEG tube placement to serve as an additional anchor and for post op gastric decompression. Prior to full closure of the defect, a 24 Fr blake drain was placed in the left chest and secured at the skin. A flat JP drain was then placed in the left upper quadrant through one of the port sites and secured similarly.  The liver retractor was removed and all ports removed under direct vision with no bleeding sequelae, and the robot undocked. Closure was performed with 4-0 monocryl subcuticular stitches and sterile bandages were applied.  The patient was awakened, extubated and transferred to the CVICU in stable condition.      There were no immediate complications of the procedure.  The instrument, sponge and needle counts were reported as correct x2.  I was present throughout the entire procedure and performed all key aspects. Ethyl Sanes, MD was my teaching resident for a portion of the case. Lenis Ferries, MD was my co-surgeon for the case as there was no continuous assistance by a qualified resident.      Cornel Likes, MD

## 2023-07-29 NOTE — Nurses Notes (Signed)
 Patient reassessment completed. No change from previous assessment. VSS. Patient is resting in bed, call bell is within reach and 3 side rails are up. No s/s of distress or complaints. Will continue to monitor patient closely at this time.    Nestora Constant, RN  07/29/2023 00:15

## 2023-07-29 NOTE — Progress Notes (Signed)
 [facility]  Thoracic Surgery   Progress Note      Elizabeth [name], Elizabeth [name]  Date of Admission:  07/24/2023  Date of Birth:  [birth year]    Hospital Day:  LOS: 5 days   Date of Service:  07/29/2023    Subjective   No acute events overnight. Patient reports feeling overall well. Looking forward to advancing her diet. No complaints.     Outputs:  Abdominal JP drain: 70cc  Left chest tube: 150cc  PEG: Capped    Current Diet:    MNT PROTOCOL FOR DIETITIAN  DIET REGULAR Consistency/thickening: Solid: LEVEL 7: Easy to Chew    Current Medications:  acetaminophen  (TYLENOL ) tablet, 650 mg, Oral, Q4H PRN  atorvastatin  (LIPITOR) tablet, 20 mg, Oral, QPM  Correction/SSIP insulin  lispro 100 units/mL injection, 2-9 Units, Subcutaneous, 4x/day AC  D5W 250 mL flush bag, , Intravenous, Q15 Min PRN  dextrose  (GLUTOSE) 40% oral gel, 15 g, Oral, Q15 Min PRN  dextrose  50% (0.5 g/mL) injection - syringe, 12.5 g, Intravenous, Q15 Min PRN  [START ON 08/03/2023] ergocalciferol-vitamin D2 (DRISDOL) 8,000 units per mL oral drops, 50,000 Units, Oral, Q7 Days  escitalopram  (LEXAPRO ) tablet, 20 mg, Oral, Daily  famotidine  (PEPCID ) tablet, 20 mg, Oral, Daily  glucagon  injection 1 mg, 1 mg, IntraMUSCULAR, Once PRN  heparin  25,000 units in 1/2 NS 250 mL infusion, 18 Units/kg/hr (Adjusted), Intravenous, Continuous  hydrALAZINE (APRESOLINE) injection 10 mg, 10 mg, Intravenous, Q6H PRN  levothyroxine  (SYNTHROID ) tablet, 25 mcg, Oral, QAM  losartan  (COZAAR ) tablet, 100 mg, Oral, Daily  NS 250 mL flush bag, , Intravenous, Q15 Min PRN  NS bolus infusion 40 mL, 40 mL, Intravenous, Once PRN  NS flush syringe, 2-6 mL, Intracatheter, Q8HRS  NS flush syringe, 2-6 mL, Intracatheter, Q1 MIN PRN  phenytoin  sodium extended release (DILANTIN ) capsule, 300 mg, Oral, 2x/day  sennosides-docusate sodium  (SENOKOT-S) 8.6-50mg  per tablet, 1 Tablet, Oral, 2x/day  tamsulosin  (FLOMAX ) capsule, 0.4 mg, Oral, Daily after Dinner      Vital Signs:  Temp (24hrs) Max:36.8 C (98.3  F)  Temperature: 36.8 C (98.3 F)  BP (Non-Invasive): (!) 134/90  MAP (Non-Invasive): 96 mmHG  Heart Rate: 85  Respiratory Rate: 16  SpO2: 94 %  Base (Admission) Weight:  Weight: 83.2 kg (183 lb 6.8 oz)  Weight:  Weight: 83.8 kg (184 lb 11.9 oz)    Physical Exam:  General: no acute distress, appears stated age  Head: normocephalic, atraumatic  Chest: clear, normal excursion, left chest tube in place with serosanguinous output   Heart: RRR  Abdomen: soft, non-distended, abdominal JP drain in place with serosanguinous output  Extremeties: no edema, cyanosis  Skin: normal turgor, non-icteric  Neuro: no focal neurological deficit    Labs:  I have reviewed all pertinent lab results as below:  Results for orders placed or performed during the hospital encounter of 07/24/23 (from the past 24 hours)   POC BLOOD GLUCOSE (RESULTS)   Result Value Ref Range    GLUCOSE, POC 103 65 - 125 mg/dl   PTT (PARTIAL THROMBOPLASTIN TIME)   Result Value Ref Range    APTT 85.9 (H) 24.3 - 37.6 seconds   POC BLOOD GLUCOSE (RESULTS)   Result Value Ref Range    GLUCOSE, POC 95 65 - 125 mg/dl   PTT (PARTIAL THROMBOPLASTIN TIME)   Result Value Ref Range    APTT 89.0 (H) 24.3 - 37.6 seconds   BASIC METABOLIC PANEL   Result Value Ref Range    SODIUM 137  136 - 145 mmol/L    POTASSIUM 3.6 3.5 - 5.1 mmol/L    CHLORIDE 105 96 - 111 mmol/L    CO2 TOTAL 21 (L) 22 - 30 mmol/L    ANION GAP 11 4 - 13 mmol/L    CALCIUM  8.5 (L) 8.6 - 10.2 mg/dL    GLUCOSE 899 65 - 874 mg/dL    BUN 8 8 - 25 mg/dL    CREATININE 9.39 9.39 - 1.05 mg/dL    BUN/CREA RATIO 13 6 - 22    ESTIMATED GFR - FEMALE >90 >=60 mL/min/BSA   PHOSPHORUS   Result Value Ref Range    PHOSPHORUS 2.9 2.4 - 4.7 mg/dL   MAGNESIUM    Result Value Ref Range    MAGNESIUM  1.8 1.8 - 2.6 mg/dL   CBC   Result Value Ref Range    WBC 8.0 3.7 - 11.0 x10^3/uL    RBC 3.47 (L) 3.85 - 5.22 x10^6/uL    HGB 10.9 (L) 11.5 - 16.0 g/dL    HCT 67.7 (L) 65.1 - 46.0 %    MCV 92.8 78.0 - 100.0 fL    MCH 31.4 26.0 - 32.0 pg     MCHC 33.9 31.0 - 35.5 g/dL    RDW-CV 86.6 88.4 - 84.4 %    PLATELETS 223 150 - 400 x10^3/uL    MPV 9.0 8.7 - 12.5 fL   PTT (PARTIAL THROMBOPLASTIN TIME)   Result Value Ref Range    APTT 67.4 (H) 24.3 - 37.6 seconds   POC BLOOD GLUCOSE (RESULTS)   Result Value Ref Range    GLUCOSE, POC 118 65 - 125 mg/dl   PTT (PARTIAL THROMBOPLASTIN TIME)   Result Value Ref Range    APTT 54.1 (H) 24.3 - 37.6 seconds   POC BLOOD GLUCOSE (RESULTS)   Result Value Ref Range    GLUCOSE, POC 154 (H) 65 - 125 mg/dl   URINALYSIS, MACROSCOPIC   Result Value Ref Range    SPECIFIC GRAVITY 1.011 1.005 - 1.030    GLUCOSE Negative Negative mg/dL    PROTEIN Negative Negative mg/dL    BILIRUBIN Negative Negative mg/dL    UROBILINOGEN Negative Negative mg/dL    PH 6.0 5.0 - 8.0    BLOOD Negative Negative mg/dL    KETONES 40 (A) Negative mg/dL    NITRITE Negative Negative    LEUKOCYTES Small (A) Negative WBCs/uL    APPEARANCE Clear Clear    COLOR Normal (Yellow) Normal (Yellow)   URINALYSIS, MICROSCOPIC   Result Value Ref Range    WBCS 6.0 <11.0 /hpf    RBCS 1.0 <6.0 /hpf    BACTERIA Occasional or less Occasional or less /hpf    YEAST Occasional or less Occasional or less /hpf    SQUAMOUS EPITHELIAL CELLS Occasional or less Occasional or less /lpf    RENAL EPITHELIAL Occasional or less Occasional or less /lpf    MUCOUS Light Light /lpf     Radiology:  (images and reports personally reviewed):    Chest x-ray 07/29/23:  IMPRESSION:  Left-sided chest tube remains in place. Heart size is prominent but stable. There is left base opacity likely related to atelectasis and possibly a small left-sided pleural effusion. No evidence of pulmonary edema. No visible pneumothorax.    ASSESSMENT:  Patient is a 53 year old female who was transferred to [facility] for a left diaphragmatic hernia that developed following left nephrectomy at [facility] on 06/19/23. OR 6/20 for robotic-assisted diaphragm hernia repair, gastropexy, and PEG  placement.    PLAN:  Left  diaphragmatic hernia s/p robotic-assisted diaphragm hernia repair, gastropexy, and PEG placement on 6/20   -Advance to soft diet    -Clamp PEG tube    -Vent tube q6hr or as needed   -Abdominal JP drain to bulb suction    - abdominal fluid amylase ordered   -Left chest tube to -20 suction   -AM labs and CXR    Acute pulmonary embolism, dx 1 week ago  - No evidence of R heart strain  - Holding home Eliquis   - Continue heparin  gtt    Chronic Medical Conditions     Obstructive Sleep Apnea    - Continue holding home CPAP given recent surgery     Gastroesophageal Reflux Disease    - Continue home Pepcid     Hypertension  Hyperlipidemia    - Holding home Losartan    - Hydralazine 10mg   Q6h PRN  - Continue home Lipitor     Type II Diabetes Mellitus   - Holding home Ozempic     - SSI     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    Lauraine Donning, PA-C          I both saw and examined the patient today in concert with Lauraine Donning, PA-C and the resident team. Any pertinent interval imaging studies and labs were personally reviewed. See her note for details.  I agree with the assessment and plan as outlined. Any exceptions or additions are edited/noted.    Cornel Likes, MD

## 2023-07-29 NOTE — Progress Notes (Signed)
 [facility]  HVI Critical Care Consult/Progress Note    Shannan, Slinker, 53 y.o. female  Date of Birth: 11/25/1970  Medical Record Number:  [id number]   Inpatient Admission Date: 07/24/2023   Hospital Day:  LOS: 5 days     Chief Complaint: Post-op medical management    History of Present Illness: Per previous provider Toye Rouillard is a 53 y.o. 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 Pt presented to outside hospital 6/11 with acute chest pain and workup demonstrated new segmental and subsegmental PE. A new stomach containing diaphragmatic hernia with air fluid levels was present on this scan. She was admitted and initiated on heparin  gtt and transitioned to Eliquis  for discharge on 6/14. She re-presented to outside hospital for worsening chest pain and CT demonstrated increased size of stomach containing diaphragmatic hernia with air fluid levels. Transferred to [facility] for further management. Pt currently reports pain from the NGT. Denies abdominal pain, chest pain, nausea,  SOB, fevers or chills. She denies any difficulty with food intake and hasn't had any vomiting. Last dose of Eliquis  6/18 morning. PSH includes open left nephrectomy 06/19/23 and remote laparoscopic cholecystectomy.        POD #/ Procedure: 6/21:   Robotic diaphragm hernia repair, gastropexy, PEG tube placement      Hospital Course/Major events:  6/21:  arrived to CVICU extubated on no gtts. Plan per TS to be strict NPO.   6/22:  cap G tube.  Start CLD.  Transfer to SDU.  6/23:  SDU  6/24:  SDU     Past Medical History: Past Medical History:   Diagnosis Date    Anemia     Cancer (CMS HCC)     brain tumor 8/15 with chemo and radiation    Chest pain, unspecified type 07/17/2023    CPAP (continuous positive airway pressure) dependence     CVA  (cerebrovascular accident)     mini during brain surgery per pt    Diabetes mellitus, type 2     Disorder of thyroid     Dyspnea, unspecified type 07/17/2023    Epileptic seizure (CMS HCC)     Essential hypertension     History of kidney surgery 06/2023    Pulmonary embolism 07/16/2023    Sleep apnea        Past Surgical History: Past Surgical History:   Procedure Laterality Date    BRAIN SURGERY      HX CHOLECYSTECTOMY        Social History:  Social History     Socioeconomic History    Marital status: Divorced     Spouse name: Not on file    Number of children: Not on file    Years of education: Not on file    Highest education level: Not on file   Occupational History    Not on file   Tobacco Use    Smoking status: Never    Smokeless tobacco: Never   Vaping Use    Vaping status: Never Used   Substance and Sexual Activity    Alcohol use: Never    Drug use: Never    Sexual activity: Not on file   Other Topics Concern    Ability to Walk 1 Flight of Steps without SOB/CP Yes    Routine Exercise No  Ability to Walk 2 Flight of Steps without SOB/CP No    Unable to Ambulate Not Asked    Total Care Not Asked    Ability To Do Own ADL's Yes    Uses Walker Not Asked    Other Activity Level Not Asked    Uses Cane Not Asked   Social History Narrative    Not on file     Social Determinants of Health     Financial Resource Strain: Low Risk  (07/24/2023)    Financial Resource Strain     SDOH Financial: No   Transportation Needs: High Risk (07/24/2023)    Transportation Needs     SDOH Transportation: Yes, it has kept me from medical appointments or from getting my medications   Social Connections: Low Risk  (07/24/2023)    Social Connections     SDOH Social Isolation: 5 or more times a week   Intimate Partner Violence: Not on file   Housing Stability: Low Risk  (07/24/2023)    Housing Stability     SDOH Housing Situation: I have housing.     SDOH Housing Worry: No      Family History: Family Medical History:       Problem Relation  (Age of Onset)    Arthritis-osteo Mother    Breast Cancer Paternal Grandmother    Heart Disease Father    Hypertension (High Blood Pressure) Mother    No Known Problems Sister, Brother, Maternal Grandmother, Maternal Grandfather, Paternal Grandfather, Daughter, Son, Maternal Aunt, Maternal Uncle, Paternal Aunt, Paternal Uncle, Other             Allergies: Allergies[1]   ROS:  no fever, chills, n/v, abd pain, cough, wheeze, chest pain.  Remaining ROS neg.        Problem List: Problem List[2]    Medications:  Scheduled Meds: atorvastatin , 20 mg, QPM  bisacodyL , 10 mg, Now  Correction/SSIP insulin  lispro 100 units/mL injection, 2-9 Units, 4x/day AC  [START ON 08/03/2023] ergocalciferol (vitamin D2), 50,000 Units, Q7 Days  escitalopram  oxalate, 20 mg, Daily  famotidine , 20 mg, Daily  levothyroxine , 25 mcg, QAM  losartan , 100 mg, Daily  NS flush, 2-6 mL, Q8HRS  phenytoin  sodium extended release, 300 mg, 2x/day      IV Infusions: heparin , Last Rate: 18 Units/kg/hr (07/29/23 0724)      PRN:  acetaminophen , 650 mg, Q4H PRN  D5W, , Q15 Min PRN  dextrose , 15 g, Q15 Min PRN  dextrose , 12.5 g, Q15 Min PRN  glucagon  (GLUCAGEN) injection, 1 mg, Once PRN  hydrALAZINE (APRESOLINE) injection, 10 mg, Q6H PRN  NS, , Q15 Min PRN  NS bolus, 40 mL, Once PRN  NS flush, 2-6 mL, Q1 MIN PRN        Last VS:    Temperature: 36.8 C (98.2 F)  Heart Rate: 86  Respiratory Rate: 19  BP (Non-Invasive): (!) 120/99      Comprehensive Physical Exam:    Constitutional: appears chronically ill, moderately obese, appears older than stated age, no distress, and vital signs reviewed  Eyes: Conjunctiva clear., Sclera non-icteric.   ENT: Mouth mucous membranes moist.   Neck: supple, symmetrical, trachea midline  Respiratory: Breathing nonlabored, Clear to auscultation bilaterally.  Left CT to -20sx   Cardiovascular: regular rate and rhythm on the monitor  no murmurs  No JVD  Gastrointestinal: non-distended, Soft, non-tender PEG tube in place  Genitourinary:  deferred  Musculoskeletal: AROM and PROM  Integumentary:  Skin warm and dry and  ABD incision OTA w/ no signs of infection or drainage   Neuro:  GCS 15      [x] I have reviewed the patient's vitals signs, the nursing notes, the physician's notes and other progress notes   [x] I have reviewed the laboratory and imaging data      Systems Based Assessment and Plan:    Neurologic:  Data/Assessment CAM: Reviewed   GCS: Reviewed    Diagnosis Incisional pain  Mood Disorder   Hx Brain tumor s/p chemo/radiation and resection, CVA, Seizures,    Course: stable   Plan cont AED  Serial neuro exams  Day / Night hygiene   PT / OT  OOB      Cardiovascular:  Data/Assessment Most Recent Hemodynamics: Reviewed   Cardiovascular support: None     Diagnosis Hypertension  Hx HTN, HLD   Course stable   Plan BP therapeutics, PO  Monitor Electrolytes  Serial hemodynamic checks        Respiratory:  Data/Assessment: CTO #1: Reviewed   CXR: Reviewed   Most recent ABG: Reviewed      Diagnosis 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 repair, gastropexy, PEG tube placement   Recent segmental and subsegmental PE   Hx OSA   Course stable   Plan heparin  gtt   Maintain CT to -20 sx, continue to monitor output   Holding home CPAP per TS      Renal:  Data/Assessment: Reviewed    Diagnosis Hypovolemia , resolved    Hx RCC s/p left nephrectomy 06/19/23   Course stable   Plan Replacing electrolytes prn  I&O      Follows w/ Urology and Oncology outpatient  for recent nephrectomy      Gastrointestinal:  Data/Assessment: Pantoprazole (Protonix)  for stress ulcer prophylaxis   Diet:  PO   Bowel Regimen:     Diagnosis 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 repair, gastropexy, PEG tube placement   Hx GERD   Course  stable   Plan diet per TS  PPI        Endocrine:  Data/Assessment Last three Fingerstick glucose: Reviewed   Last HbA1C: 5.1  Most recent Thyroid results: 1.299     Diagnosis Insulin  dependent diabetes mellitus (IDDM)    Course Unchanged   Plan   Target glucose 140-180     Hematologic:  Data/Assessment:  Reviewed    Diagnosis No active diagnosis   Course Stable   Plan heparin   For DVT Prophylaxis       Infectious Diseases:  Data/Assessment Tmax last 24 hrs: Temp (24hrs) Max:37 C (98.6 F)       Diagnosis No active diagnosis    Course Stable   Plan monitor       Family Communication and Disposition:  Data/Assessment Pt updated on plan of care       I saw and examined the patient at bedside and discussed the patient with the CVICU and Consultant teams.   The care of this patient was in regard to managing the conditions listed above.  The data reviewed and care planning were performed in direct proximity of the patient.  Medications, allergies, vital signs, lab tests, imaging, nursing notes and physician notes have been reviewed.     Marcey Bolk, MD  HVI Critical Care       [1]   Allergies  Allergen Reactions    Ace Inhibitors      Other reaction(s): Angioedema   [2]   Patient Active Problem List  Diagnosis    Left renal mass    Kidney mass    Pulmonary embolism    Chest pain, unspecified type    Dyspnea, unspecified type    History of nephrectomy    History of astrocytoma    Clear cell renal cell carcinoma, left (CMS HCC)    Pulmonary nodules    Diaphragmatic hernia    Confusion

## 2023-07-30 ENCOUNTER — Inpatient Hospital Stay (HOSPITAL_COMMUNITY)

## 2023-07-30 DIAGNOSIS — Z7985 Long-term (current) use of injectable non-insulin antidiabetic drugs: Secondary | ICD-10-CM

## 2023-07-30 LAB — PTT (PARTIAL THROMBOPLASTIN TIME)
APTT: 68.7 s — ABNORMAL HIGH (ref 24.3–37.6)
APTT: 54.7 s — ABNORMAL HIGH (ref 24.3–37.6)
APTT: 67.3 s — ABNORMAL HIGH (ref 24.3–37.6)

## 2023-07-30 LAB — CBC
HCT: 31.8 % — ABNORMAL LOW (ref 34.8–46.0)
HGB: 10.9 g/dL — ABNORMAL LOW (ref 11.5–16.0)
MCH: 32.4 pg — ABNORMAL HIGH (ref 26.0–32.0)
MCHC: 34.3 g/dL (ref 31.0–35.5)
MCV: 94.6 fL (ref 78.0–100.0)
MPV: 9.2 fL (ref 8.7–12.5)
PLATELETS: 253 10*3/uL (ref 150–400)
RBC: 3.36 10*6/uL — ABNORMAL LOW (ref 3.85–5.22)
RDW-CV: 13.3 % (ref 11.5–15.5)
WBC: 7.4 10*3/uL (ref 3.7–11.0)

## 2023-07-30 LAB — BASIC METABOLIC PANEL
ANION GAP: 7 mmol/L (ref 4–13)
BUN/CREA RATIO: 14 (ref 6–22)
BUN: 8 mg/dL (ref 8–25)
CALCIUM: 8.2 mg/dL — ABNORMAL LOW (ref 8.6–10.2)
CHLORIDE: 109 mmol/L (ref 96–111)
CO2 TOTAL: 24 mmol/L (ref 22–30)
CREATININE: 0.56 mg/dL — ABNORMAL LOW (ref 0.60–1.05)
ESTIMATED GFR - FEMALE: >90 mL/min/BSA (ref >=60)
GLUCOSE: 96 mg/dL (ref 65–125)
POTASSIUM: 3.5 mmol/L (ref 3.5–5.1)
SODIUM: 140 mmol/L (ref 136–145)

## 2023-07-30 LAB — MAGNESIUM: MAGNESIUM: 2.2 mg/dL (ref 1.8–2.6)

## 2023-07-30 LAB — POC BLOOD GLUCOSE (RESULTS)
GLUCOSE, POC: 117 mg/dL (ref 65–125)
GLUCOSE, POC: 152 mg/dL — ABNORMAL HIGH (ref 65–125)
GLUCOSE, POC: 107 mg/dL (ref 65–125)
GLUCOSE, POC: 126 mg/dL — ABNORMAL HIGH (ref 65–125)

## 2023-07-30 LAB — PHOSPHORUS: PHOSPHORUS: 3.4 mg/dL (ref 2.4–4.7)

## 2023-07-30 MED ORDER — POTASSIUM CHLORIDE ER 20 MEQ TABLET,EXTENDED RELEASE(PART/CRYST)
40.0000 meq | ORAL_TABLET | ORAL | Status: AC
Start: 2023-07-30 — End: 2023-07-30
  Administered 2023-07-30: 40 meq via ORAL
  Filled 2023-07-30: qty 2

## 2023-07-30 MED ORDER — OXYCODONE 5 MG TABLET
5.0000 mg | ORAL_TABLET | ORAL | Status: DC | PRN
Start: 2023-07-30 — End: 2023-08-03
  Administered 2023-07-30 – 2023-08-02 (×4): 5 mg via ORAL
  Filled 2023-07-30 (×4): qty 1

## 2023-07-30 NOTE — Care Management Notes (Signed)
 [facility]  Care Management Note    Patient Name: Elizabeth [name]  Date of Birth: [birth year]  Sex: female  Date/Time of Admission: 07/24/2023 12:11 AM  Room/Bed: 08/A  Payor: BLUE CROSS BLUE SHIELD / Plan: [place] [employer] PPO / Product Type: PPO /    LOS: 6 days   Primary Care Providers:  Pcp, No (General)    Admitting Diagnosis:  Diaphragmatic hernia [K44.9]    Assessment:      07/30/23 1014   Assessment Details   Assessment Type Continued Assessment   Date of Care Management Update 07/30/23   Date of Next DCP Update 08/01/23   Insurance Information/Type   Insurance type Commercial   Care Management Plan   Discharge Planning Status plan in progress   Projected Discharge Date 08/01/23   Discharge plan discussed with: Patient   CM will evaluate for rehabilitation potential yes   Patient choice offered to patient/family Yes  (Choice provided for SNF)   Discharge Needs Assessment   Outpatient/Agency/Support Group Needs inpatient rehabilitation facility;skilled nursing facility   Discharge Facility/Level of Care Needs SNF vs Rehab         Discharge Plan:  SNF vs Rehab   Per service, chest tube could possibly be removed tomorrow then patient would be discharge ready. Met with patient at the bedside. Patient was very tearful about her family being 4 hours away from her. States that she has only been home 4-6 days since Mother's Day and that the vehicles in her family all has problems and would not make the trip to come see here. Emotional support provided. Discussed PT/OT recommendations with patient. Patient states that she was in [facility] recently and she would like to return there, if not accepted she was agreeable to going to a SNF. Provided patient CarePort listing with CMS ratings for Skilled Nursing Facility  in 20 mile radius of [address] zip code. The list provided is available in CarePort. Referral sent for [facility] via Careport. Task sent for EMS benefits/preferred providers and if  auth was required.  Called [facility] at [phone] and spoke to the Admission Liaison Geni [phone], she confirmed that referral was received and reviewed. Stated that the patient was in the facility at the end of May. Liaison that will be following is [place] [phone].      The patient will continue to be evaluated for developing discharge needs.     Case Manager: Saul Lane, CASE MANAGER  Phone: [phone]

## 2023-07-30 NOTE — Care Plan (Signed)
 [facility]  Rehabilitation Services  Physical Therapy Progress Note      Patient Name: Elizabeth [name]  Date of Birth: 1970/09/12  Height:  160 cm (5' 3)  Weight:  82.5 kg (181 lb 14.1 oz)  Room/Bed: 08/A  Payor: BLUE CROSS BLUE SHIELD / Plan: [place] [employer] [employer] PPO / Product Type: PPO /     Assessment:     Ms. Gruenhagen tolerated today's session well. Pt required Min A for supine to sit and CGA-Min A for STS/toileting transfers at [facility]. She ambulated ~60ft with FWW and CGA. PT recommends IRF vs SNF placement to address deficits in strength, endurance, balance, coordination, ROM, and posture control once medically cleared for d/c.    Discharge Needs:   Equipment Recommendation: TBD    Discharge Disposition: inpatient rehabilitation facility, skilled nursing facility    JUSTIFICATION OF DISCHARGE RECOMMENDATION   Based on current diagnosis, functional performance prior to admission, and current functional performance, this patient requires continued PT services in inpatient rehabilitation facility, skilled nursing facility in order to achieve significant functional improvements in these deficit areas: aerobic capacity/endurance, arousal, attention, and cognition, gait, locomotion, and balance.      Plan:   Continue to follow patient according to established plan of care.  The risks/benefits of therapy have been discussed with the patient/caregiver and he/she is in agreement with the established plan of care.     Subjective & Objective:        07/30/23 9096   Therapist Pager   PT Assigned/ Pager # [phone]   Rehab Session   Document Type therapy progress note (daily note)   PT Visit Date 07/30/23   Total PT Minutes: 26   Patient Effort good   Symptoms Noted During/After Treatment fatigue   General Information   Patient Profile Reviewed yes   Medical Lines PIV Line;Telemetry;Chest Tube   Respiratory Status room air   Existing Precautions/Restrictions full code;fall precautions   Mutuality/Individual Preferences    Individualized Care Needs OOB w/FWW Ax1-2   Pre Treatment Status   Pre Treatment Patient Status Patient supine in bed;Call light within reach;Telephone within reach;Patient safety alarm activated;Nurse approved session   Support Present Pre Treatment  None   Communication Pre Treatment  Nurse   Communication Pre Treatment Comment Nurse approved session   Cognition   Behavior/Mood Observations alert;cooperative   Attention mild impairment;distractible   Follows Commands follows multi-step commands;increased processing time needed   Vital Signs   O2 Delivery Pre Treatment room air   O2 Delivery Post Treatment room air   Vitals Comment VSS   Pain Assessment   Pre/Posttreatment Pain Comment No c/o of pain   Bed Mobility   Supine-Sit Independence minimum assist (75% patient effort)   Bed Mobility, Assistive Device Head of Bed Elevated   Safety Issues decreased use of arms for pushing/pulling;impaired trunk control for bed mobility   Impairments balance impaired;coordination impaired;endurance;flexibility decreased;postural control impaired;ROM decreased;strength decreased   Transfer Assessment/Treatment   Sit-Stand Independence contact guard assist   Stand-Sit Independence contact guard assist   Sit-Stand-Sit, Assist Device walker, front wheeled   Bed-Chair Independence minimum assist (75% patient effort)   Bed-Chair-Bed Assist Device walker, front wheeled   Toilet Transfer Independence minimum assist (75% patient effort)   Toilet Transfer Assist Device walker, front wheeled   Transfer Safety Issues balance decreased during turns;step length decreased   Transfer Impairments balance impaired;coordination impaired;endurance;postural control impaired;ROM decreased;strength decreased   Gait Assessment/Treatment   Total Distance Ambulated 3  Independence  contact guard assist   Assistive Device  walker, front wheeled   Distance in Feet 55ft   Gait Speed decreased   Deviations  cadence decreased;step length decreased    Safety Issues  balance decreased during turns;step length decreased   Impairments  balance impaired;coordination impaired;endurance;postural control impaired;strength decreased   Balance   Comment w/FWW   Sitting Balance: Static fair + balance   Sitting, Dynamic (Balance) fair balance   Sit-to-Stand Balance fair - balance   Standing Balance: Static fair - balance   Standing Balance: Dynamic poor + balance   Systems Impairment Contributing to Balance Disturbance musculoskeletal   Identified Impairments Contributing to Balance Disturbance decreased strength;decreased ROM;impaired postural control;impaired coordination   Post Treatment Status   Post Treatment Patient Status Patient sitting in bedside chair or w/c;Call light within reach;Telephone within reach;Patient safety alarm activated   Support Present Post Treatment  None   Communication Post Treatement Nurse   Communication Post Treatment Comment pt performance   Plan of Care Review   Plan Of Care Reviewed With patient   Basic Mobility Am-PAC/6Clicks Score (APPROVED Staff)   Turning in bed without bedrails 3   Lying on back to sitting on edge of flat bed 3   Moving to and from a bed to a chair 3   Standing up from chair 3   Walk in room 2   Climbing 3-5 steps with railing 2   6 Clicks Raw Score total 16   Standardized (t-scale) score 38.32   Patient Mobility Goal (JHHLM) 5- Stand 1 minute or more 3X/day   Exercise/Activity Level Performed 5- Static standing>1 minute   Physical Therapy Clinical Impression   Assessment Ms. Lundin tolerated today's session well. Pt required Min A for supine to sit and CGA-Min A for STS/toileting transfers at [facility]. She ambulated ~74ft with FWW and CGA. PT recommends IRF vs SNF placement to address deficits in strength, endurance, balance, coordination, ROM, and posture control once medically cleared for d/c.   Anticipated Equipment Needs at Discharge (PT) TBD   Anticipated Discharge Disposition inpatient rehabilitation  facility;skilled nursing facility       Therapist:   Elsie Rase, PTA   Pager #: [phone]

## 2023-07-30 NOTE — Care Plan (Signed)
 [facility]  Rehabilitation Services  Occupational Therapy Progress Note    Patient Name: Elizabeth [name]  Date of Birth: 15-Jan-1971  Height:  160 cm (5' 3)  Weight:  82.5 kg (181 lb 14.1 oz)  Room/Bed: 08/A  Payor: BLUE CROSS BLUE SHIELD / Plan: [place] [employer] [employer] PPO / Product Type: PPO /     Assessment:    Pt tolerated OT tx session well this date, she participated in bed mobility, funcitonal transfers, and toileting. Pt required min A for transfer to EOB to achieve fully upright posture and CGA for STS to FWW w/ cues for hand placement and safety. Pt required min A for SPT from EOB -> BSC -> chair for safety, steadying and coordination. Provided mod A during toileting via BSC d/t deficits in balance and coordination. Once in chair pt appeared fatigued from activity. At this time, recommend d/c to IPR vs SNF for further skilled interdisciplinary care as at CLOF pt would be unable to return home safely d/t limited support.      Discharge Needs:   Equipment Recommendation: none anticipated      Discharge Disposition:  skilled nursing facility, inpatient rehabilitation facility     JUSTIFICATION OF DISCHARGE RECOMMENDATION   Based on current diagnosis, functional performance prior to admission, and current functional performance, this patient requires continued OT services in skilled nursing facility, inpatient rehabilitation facility in order to achieve significant functional improvements.    Plan:   Continue to follow patient according to established plan of care.  The risks/benefits of therapy have been discussed with the patient/caregiver and he/she is in agreement with the established plan of care.     Subjective & Objective:      07/30/23 0837   Therapist Pager   OT Assigned/ Pager # [phone]   Rehab Session   Document Type therapy progress note (daily note)   OT Visit Date 07/30/23   Total OT Minutes: 26   Patient Effort good   Symptoms Noted During/After Treatment fatigue   General Information    Patient Profile Reviewed yes   General Observations of Patient pt seen b/s and was agreeable for tx   Medical Lines PIV Line;Telemetry;Chest Tube   Respiratory Status room air   Existing Precautions/Restrictions full code;fall precautions   Pre Treatment Status   Pre Treatment Patient Status Patient supine in bed;Call light within reach;Telephone within reach;Patient safety alarm activated;Nurse approved session   Support Present Pre Treatment  None   Communication Pre Treatment  Nurse   Communication Pre Treatment Comment Nurse approved session   Mutuality/Individual Preferences   Individualized Care Needs OOB via FWW Ax1-2; encourage ADL IND   Vital Signs   O2 Delivery Pre Treatment room air   O2 Delivery Post Treatment room air   Vitals Comment VSS   Pain Assessment   Pre/Posttreatment Pain Comment no c/o pain   Coping/Psychosocial   Observed Emotional State calm;cooperative   Coping/Psychosocial Response Interventions   Plan Of Care Reviewed With patient   Cognition   Behavior/Mood Observations alert;cooperative   Attention mild impairment;distractible   Follows Commands follows multi-step commands;increased processing time needed   Bed Mobility   Supine-Sit Independence minimum assist (75% patient effort)   Bed Mobility, Assistive Device Head of Bed Elevated   Comment min A to achieve fully upright position   Safety Issues decreased use of arms for pushing/pulling;impaired trunk control for bed mobility   Impairments balance impaired;coordination impaired;endurance;flexibility decreased;postural control impaired;ROM decreased;strength decreased   Transfer  Assessment/Treatment   Sit-Stand Independence contact guard assist   Stand-Sit Independence contact guard assist   Sit-Stand-Sit, Assist Device walker, front wheeled   Bed-Chair Independence minimum assist (75% patient effort)   Bed-Chair-Bed Assist Device walker, front wheeled   Toilet Transfer Independence minimum assist (75% patient effort)   Toilet  Transfer Assist Device walker, front wheeled   Transfer Safety Issues balance decreased during turns;step length decreased   Transfer Impairments balance impaired;coordination impaired;endurance;postural control impaired;ROM decreased;strength decreased   Lower Body Dressing Assessment/Training   Position sitting   Comment Able to pull up B socks w/ CGA while EOB   Toileting Assessment/Training   Assistive Devices bedside commode   Position sitting;standing   TOILETING ASSESSED Adjust clothing prior;Adjust clothing after;Perineal hygiene   ASSISTANCE REQUIRED  Perineal hygiene   Independence Level  moderate assist (50% patient effort)   Impairments activity tolerance impaired;balance impaired;coordination impaired;flexibility decreased;ROM decreased;postural control impaired   Comment pt required mod A to ensure cleanliness during pericare and to manage toileting tasks in standing d/t decreased balance   Balance   Comment w/ FWW   Sitting Balance: Static fair + balance   Sitting, Dynamic (Balance) fair balance   Sit-to-Stand Balance fair - balance   Standing Balance: Static fair - balance   Standing Balance: Dynamic poor + balance   Systems Impairment Contributing to Balance Disturbance musculoskeletal   Identified Impairments Contributing to Balance Disturbance decreased strength;decreased ROM;impaired postural control;impaired coordination   Post Treatment Status   Post Treatment Patient Status Patient sitting in bedside chair or w/c;Call light within reach;Telephone within reach;Patient safety alarm activated   Support Present Post Treatment  None   Communication Post Treatement Nurse   Communication Post Treatment Comment pt performance   Clinical Impression   Functional Level at Time of Session Pt tolerated OT tx session well this date, she participated in bed mobility, funcitonal transfers, and toileting. Pt required min A for transfer to EOB to achieve fully upright posture and CGA for STS to FWW w/ cues for  hand placement and safety. Pt required min A for SPT from EOB -> BSC -> chair for safety, steadying and coordination. Provided mod A during toileting via BSC d/t deficits in balance and coordination. Once in chair pt appeared fatigued from activity. At this time, recommend d/c to IPR vs SNF for further skilled interdisciplinary care as at CLOF pt would be unable to return home safely d/t limited support.   Anticipated Equipment Needs at Discharge none anticipated   Anticipated Discharge Disposition skilled nursing facility;inpatient rehabilitation facility       Therapist:   Warrick Canales, COTA  Pager #: [phone]

## 2023-07-30 NOTE — Nurses Notes (Signed)
 Pt transferred from CVICU into 10SE08. Pt alert, VSS and assessment per flowsheet. Pt oriented to room and call bell system. Verbalized understanding. Call bell within reach, instructed to call for assistance. Dual skin assessment completed SS active and audible.

## 2023-07-30 NOTE — Progress Notes (Signed)
 [facility]  Thoracic Surgery   Progress Note      Elizabeth [name], Elizabeth [name]  Date of Admission:  07/24/2023  Date of Birth:  1970-06-17    Hospital Day:  LOS: 6 days   Date of Service:  07/30/2023    Subjective   No acute events overnight. Patient doing well. No complaints.     Outputs:  Abdominal JP drain: 43cc  Left chest tube: 200cc  PEG: Capped    Current Diet:    MNT PROTOCOL FOR DIETITIAN  DIET REGULAR Consistency/thickening: Solid: LEVEL 7: Easy to Chew    Current Medications:  acetaminophen  (TYLENOL ) tablet, 650 mg, Oral, Q4H PRN  atorvastatin  (LIPITOR) tablet, 20 mg, Oral, QPM  Correction/SSIP insulin  lispro 100 units/mL injection, 2-9 Units, Subcutaneous, 4x/day AC  D5W 250 mL flush bag, , Intravenous, Q15 Min PRN  dextrose  (GLUTOSE) 40% oral gel, 15 g, Oral, Q15 Min PRN  dextrose  50% (0.5 g/mL) injection - syringe, 12.5 g, Intravenous, Q15 Min PRN  [START ON 08/03/2023] ergocalciferol -vitamin D2 (DRISDOL ) 8,000 units per mL oral drops, 50,000 Units, Oral, Q7 Days  escitalopram  (LEXAPRO ) tablet, 20 mg, Oral, Daily  famotidine  (PEPCID ) tablet, 20 mg, Oral, Daily  glucagon  injection 1 mg, 1 mg, IntraMUSCULAR, Once PRN  heparin  25,000 units in 1/2 NS 250 mL infusion, 18 Units/kg/hr (Adjusted), Intravenous, Continuous  hydrALAZINE  (APRESOLINE ) injection 10 mg, 10 mg, Intravenous, Q6H PRN  levothyroxine  (SYNTHROID ) tablet, 25 mcg, Oral, QAM  losartan  (COZAAR ) tablet, 100 mg, Oral, Daily  NS 250 mL flush bag, , Intravenous, Q15 Min PRN  NS bolus infusion 40 mL, 40 mL, Intravenous, Once PRN  NS flush syringe, 2-6 mL, Intracatheter, Q8HRS  NS flush syringe, 2-6 mL, Intracatheter, Q1 MIN PRN  phenytoin  sodium extended release (DILANTIN ) capsule, 300 mg, Oral, 2x/day  sennosides-docusate sodium  (SENOKOT-S) 8.6-50mg  per tablet, 1 Tablet, Oral, 2x/day  sodium bicarbonate  1 mEq/mL (8.4 %) injection ---Cabinet Override, , ,   tamsulosin  (FLOMAX ) capsule, 0.4 mg, Oral, Daily after Dinner      Vital Signs:  Temp (24hrs)  Max:37.3 C (99.1 F)  Temperature: 36.4 C (97.5 F)  BP (Non-Invasive): 137/76  MAP (Non-Invasive): 96 mmHG  Heart Rate: 80  Respiratory Rate: 18  SpO2: 98 %  Base (Admission) Weight:  Weight: 83.2 kg (183 lb 6.8 oz)  Weight:  Weight: 82.5 kg (181 lb 14.1 oz)    Physical Exam:  General: no acute distress, appears stated age  Head: normocephalic, atraumatic  Chest: clear, normal excursion, left chest tube in place with serosanguinous output   Heart: RRR  Abdomen: soft, non-distended, abdominal JP drain in place with serosanguinous output  Extremeties: no edema, cyanosis  Skin: normal turgor, non-icteric  Neuro: no focal neurological deficit    Labs:  I have reviewed all pertinent lab results as below:  Results for orders placed or performed during the hospital encounter of 07/24/23 (from the past 24 hours)   POC BLOOD GLUCOSE (RESULTS)   Result Value Ref Range    GLUCOSE, POC 118 65 - 125 mg/dl   PTT (PARTIAL THROMBOPLASTIN TIME)   Result Value Ref Range    APTT 54.1 (H) 24.3 - 37.6 seconds   POC BLOOD GLUCOSE (RESULTS)   Result Value Ref Range    GLUCOSE, POC 154 (H) 65 - 125 mg/dl   URINALYSIS, MACROSCOPIC   Result Value Ref Range    SPECIFIC GRAVITY 1.011 1.005 - 1.030    GLUCOSE Negative Negative mg/dL    PROTEIN Negative Negative mg/dL  BILIRUBIN Negative Negative mg/dL    UROBILINOGEN Negative Negative mg/dL    PH 6.0 5.0 - 8.0    BLOOD Negative Negative mg/dL    KETONES 40 (A) Negative mg/dL    NITRITE Negative Negative    LEUKOCYTES Small (A) Negative WBCs/uL    APPEARANCE Clear Clear    COLOR Normal (Yellow) Normal (Yellow)   URINALYSIS, MICROSCOPIC   Result Value Ref Range    WBCS 6.0 <11.0 /hpf    RBCS 1.0 <6.0 /hpf    BACTERIA Occasional or less Occasional or less /hpf    YEAST Occasional or less Occasional or less /hpf    SQUAMOUS EPITHELIAL CELLS Occasional or less Occasional or less /lpf    RENAL EPITHELIAL Occasional or less Occasional or less /lpf    MUCOUS Light Light /lpf   AMYLASE BODY FLUID    Result Value Ref Range    AMYLASE BODY FLUID 39 No reference intervals are established. U/L   POC BLOOD GLUCOSE (RESULTS)   Result Value Ref Range    GLUCOSE, POC 148 (H) 65 - 125 mg/dl   PTT (PARTIAL THROMBOPLASTIN TIME)   Result Value Ref Range    APTT 63.3 (H) 24.3 - 37.6 seconds   POC BLOOD GLUCOSE (RESULTS)   Result Value Ref Range    GLUCOSE, POC 164 (H) 65 - 125 mg/dl   PTT (PARTIAL THROMBOPLASTIN TIME)   Result Value Ref Range    APTT 71.9 (H) 24.3 - 37.6 seconds   CBC   Result Value Ref Range    WBC 7.4 3.7 - 11.0 x10^3/uL    RBC 3.36 (L) 3.85 - 5.22 x10^6/uL    HGB 10.9 (L) 11.5 - 16.0 g/dL    HCT 68.1 (L) 65.1 - 46.0 %    MCV 94.6 78.0 - 100.0 fL    MCH 32.4 (H) 26.0 - 32.0 pg    MCHC 34.3 31.0 - 35.5 g/dL    RDW-CV 86.6 88.4 - 84.4 %    PLATELETS 253 150 - 400 x10^3/uL    MPV 9.2 8.7 - 12.5 fL   BASIC METABOLIC PANEL   Result Value Ref Range    SODIUM 140 136 - 145 mmol/L    POTASSIUM 3.5 3.5 - 5.1 mmol/L    CHLORIDE 109 96 - 111 mmol/L    CO2 TOTAL 24 22 - 30 mmol/L    ANION GAP 7 4 - 13 mmol/L    CALCIUM  8.2 (L) 8.6 - 10.2 mg/dL    GLUCOSE 96 65 - 874 mg/dL    BUN 8 8 - 25 mg/dL    CREATININE 9.43 (L) 0.60 - 1.05 mg/dL    BUN/CREA RATIO 14 6 - 22    ESTIMATED GFR - FEMALE >90 >=60 mL/min/BSA   MAGNESIUM    Result Value Ref Range    MAGNESIUM  2.2 1.8 - 2.6 mg/dL   PHOSPHORUS   Result Value Ref Range    PHOSPHORUS 3.4 2.4 - 4.7 mg/dL   PTT (PARTIAL THROMBOPLASTIN TIME)   Result Value Ref Range    APTT 68.7 (H) 24.3 - 37.6 seconds   POC BLOOD GLUCOSE (RESULTS)   Result Value Ref Range    GLUCOSE, POC 117 65 - 125 mg/dl     Radiology:  (images and reports personally reviewed):    Chest x-ray 07/30/23:   FINDINGS:      Lines, tubes and hardware: Stable positioning of left-sided chest tube.     Lungs and pleura: Left basilar opacity likely reflects  component of atelectasis and pleural effusion. The right lung is clear. No pneumothorax     Heart and mediastinum: The heart size is normal for technique. The  mediastinal contours are normal.       Bones: No acute bony abnormality is identified.     IMPRESSION:  Small left basilar pleural effusion with adjacent atelectasis/pneumonia.     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 OR 6/20 for robotic-assisted diaphragm hernia repair, gastropexy, and PEG placement.    PLAN:  Left diaphragmatic hernia s/p robotic-assisted diaphragm hernia repair, gastropexy, and PEG placement on 6/20   -Advance to soft diet    - Continue soft diet for 6 weeks (8/5)   -Continue to clamp PEG tube    -Vent tube q6hr or as needed    - Connect PEG tube to gravity when patient on CPAP at night    -Abdominal JP removed today 6/25    - Abdominal fluid amylase negative   -Left chest tube to WS   -AM labs and CXR    Acute pulmonary embolism, dx 1 week ago  - No evidence of R heart strain  - Holding home Eliquis   - Continue heparin  gtt    Chronic Medical Conditions     Obstructive Sleep Apnea    - Restart home CPAP    - Connect patient's G-tube to gravity bag QHS while patient is using CPAP    Gastroesophageal Reflux Disease    - Continue home Pepcid     Hypertension  Hyperlipidemia    - Holding home Losartan    - Hydralazine  10mg   Q6h PRN  - Continue home Lipitor     Type II Diabetes Mellitus   - Holding home Ozempic     - SSI     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 SNF per PT -- likely d/c tomorrow     I independently of the faculty provider spent a total of (15) minutes in direct/indirect care of this patient including initial evaluation, review of laboratory,  radiology, diagnostic studies, review of medical record, order entry and coordination of care.    Lauraine Donning, PA-C          I both saw and examined the patient today in concert with Lauraine Donning, PA-C and the resident team. Any pertinent interval imaging studies and labs were personally reviewed. See her note for details.  I agree with the assessment and plan as outlined. Any exceptions or additions are edited/noted.    Cornel Likes, MD

## 2023-07-30 NOTE — Nurses Notes (Signed)
 Report called to receiving RN and pt transported via [facility], In on telemetry to 10SE08. All personal belongings sent with pt to new room.

## 2023-07-30 NOTE — Care Plan (Signed)
 Pt s/p hernia repair and peg tube placement on 6/20. JP drain removed today. CT to water  seal. Pain controled with PRN medications. Pivot with assist of 2 from bed to chair. High fall precautions maintained with sitter select in place. Call light and personal belongings within reach.     Problem: Adult Inpatient Plan of Care  Goal: Absence of Hospital-Acquired Illness or Injury  Intervention: Identify and Manage Fall Risk  Recent Flowsheet Documentation  Taken 07/30/2023 0845 by Evalene BROCKS, RN  Safety Promotion/Fall Prevention:   activity supervised   fall prevention program maintained  Intervention: Prevent Skin Injury  Recent Flowsheet Documentation  Taken 07/30/2023 0845 by Evalene BROCKS, RN  Body Position: sitting  Skin Protection: adhesive use limited  Intervention: Prevent Infection  Recent Flowsheet Documentation  Taken 07/30/2023 0845 by Evalene BROCKS, RN  Infection Prevention: barrier precautions utilized  Goal: Optimal Comfort and Wellbeing  Intervention: Provide Person-Centered Care  Recent Flowsheet Documentation  Taken 07/30/2023 0845 by Evalene BROCKS, RN  Trust Relationship/Rapport:   care explained   questions answered     Problem: Wound  Goal: Optimal Coping  Intervention: Support Patient and Family Response  Recent Flowsheet Documentation  Taken 07/30/2023 0845 by Evalene BROCKS, RN  Family/Support System Care:   self-care encouraged   support provided  Goal: Skin Health and Integrity  Intervention: Optimize Skin Protection  Recent Flowsheet Documentation  Taken 07/30/2023 0845 by Evalene BROCKS, RN  Pressure Reduction Techniques: Frequent weight shifting encouraged  Pressure Reduction Devices: Repositioning wedges/pillows utilized  Goal: Optimal Wound Healing  Intervention: Promote Wound Healing  Recent Flowsheet Documentation  Taken 07/30/2023 0845 by Evalene BROCKS, RN  Pressure Reduction Techniques: Frequent weight shifting encouraged  Pressure Reduction Devices: Repositioning wedges/pillows utilized    Problem: Health  Knowledge, Opportunity to Enhance (Adult,Obstetrics,Pediatric)  Goal: Knowledgeable about Health Subject/Topic  Description: Patient will demonstrate the desired outcomes by discharge/transition of care.  Intervention: Enhance Health Knowledge  Recent Flowsheet Documentation  Taken 07/30/2023 0845 by Evalene BROCKS, RN  Family/Support System Care:   self-care encouraged   support provided     Problem: Skin Injury Risk Increased  Goal: Skin Health and Integrity  Intervention: Optimize Skin Protection  Recent Flowsheet Documentation  Taken 07/30/2023 0845 by Evalene BROCKS, RN  Pressure Reduction Techniques: Frequent weight shifting encouraged  Pressure Reduction Devices: Repositioning wedges/pillows utilized  Skin Protection: adhesive use limited     Problem: Fall Injury Risk  Goal: Absence of Fall and Fall-Related Injury  Intervention: Identify and Manage Contributors  Recent Flowsheet Documentation  Taken 07/30/2023 0845 by Evalene BROCKS, RN  Medication Review/Management: medications reviewed  Intervention: Promote Injury-Free Environment  Recent Flowsheet Documentation  Taken 07/30/2023 0845 by Evalene BROCKS, RN  Safety Promotion/Fall Prevention:   activity supervised   fall prevention program maintained

## 2023-07-30 NOTE — Nurses Notes (Signed)
 07/30/23 2000   Urine Assessment   Bladder Scan Performed Y   Estimated Bladder Volume 454 mL     Service notified. No bladder distention or urge to void noted. Straight cath order placed. Straight cath for 450 mL.

## 2023-07-30 NOTE — Nurses Notes (Signed)
 Latest Reference Range & Units 07/30/23 18:25   aPTT 24.3 - 37.6 seconds 67.3 (H)   (H): Data is abnormally high    Heparin  gtt therapeutic x1. Next PTT @0030 

## 2023-07-31 ENCOUNTER — Other Ambulatory Visit: Payer: Self-pay

## 2023-07-31 ENCOUNTER — Ambulatory Visit (INDEPENDENT_AMBULATORY_CARE_PROVIDER_SITE_OTHER): Payer: Self-pay | Admitting: HEMATOLOGY-ONCOLOGY

## 2023-07-31 ENCOUNTER — Ambulatory Visit (INDEPENDENT_AMBULATORY_CARE_PROVIDER_SITE_OTHER): Payer: Self-pay

## 2023-07-31 DIAGNOSIS — N390 Urinary tract infection, site not specified: Secondary | ICD-10-CM

## 2023-07-31 LAB — CBC
HCT: 34.2 % — ABNORMAL LOW (ref 34.8–46.0)
HGB: 11.5 g/dL (ref 11.5–16.0)
MCH: 32.3 pg — ABNORMAL HIGH (ref 26.0–32.0)
MCHC: 33.6 g/dL (ref 31.0–35.5)
MCV: 96.1 fL (ref 78.0–100.0)
MPV: 9.3 fL (ref 8.7–12.5)
PLATELETS: 285 10*3/uL (ref 150–400)
RBC: 3.56 10*6/uL — ABNORMAL LOW (ref 3.85–5.22)
RDW-CV: 13.4 % (ref 11.5–15.5)
WBC: 8.4 10*3/uL (ref 3.7–11.0)

## 2023-07-31 LAB — PHOSPHORUS: PHOSPHORUS: 3.5 mg/dL (ref 2.4–4.7)

## 2023-07-31 LAB — BASIC METABOLIC PANEL
ANION GAP: 8 mmol/L (ref 4–13)
BUN/CREA RATIO: 11 (ref 6–22)
BUN: 7 mg/dL — ABNORMAL LOW (ref 8–25)
CALCIUM: 8.5 mg/dL — ABNORMAL LOW (ref 8.6–10.2)
CHLORIDE: 107 mmol/L (ref 96–111)
CO2 TOTAL: 23 mmol/L (ref 22–30)
CREATININE: 0.61 mg/dL (ref 0.60–1.05)
ESTIMATED GFR - FEMALE: >90 mL/min/BSA (ref >=60)
GLUCOSE: 102 mg/dL (ref 65–125)
POTASSIUM: 3.8 mmol/L (ref 3.5–5.1)
SODIUM: 138 mmol/L (ref 136–145)

## 2023-07-31 LAB — POC BLOOD GLUCOSE (RESULTS)
GLUCOSE, POC: 105 mg/dL (ref 65–125)
GLUCOSE, POC: 106 mg/dL (ref 65–125)
GLUCOSE, POC: 110 mg/dL (ref 65–125)
GLUCOSE, POC: 151 mg/dL — ABNORMAL HIGH (ref 65–125)

## 2023-07-31 LAB — CREATININE WITH EGFR
CREATININE: 0.69 mg/dL (ref 0.60–1.05)
ESTIMATED GFR - FEMALE: >90 mL/min/BSA (ref >=60)

## 2023-07-31 LAB — PTT (PARTIAL THROMBOPLASTIN TIME)
APTT: 70.1 s — ABNORMAL HIGH (ref 24.3–37.6)
APTT: 75.7 s — ABNORMAL HIGH (ref 24.3–37.6)
APTT: 79.6 s — ABNORMAL HIGH (ref 24.3–37.6)

## 2023-07-31 LAB — URINE CULTURE: URINE CULTURE: >100000 — AB

## 2023-07-31 LAB — MAGNESIUM: MAGNESIUM: 1.9 mg/dL (ref 1.8–2.6)

## 2023-07-31 MED ORDER — APIXABAN 5 MG TABLET
5.0000 mg | ORAL_TABLET | Freq: Two times a day (BID) | ORAL | Status: DC
Start: 2023-07-31 — End: 2023-08-01
  Administered 2023-07-31 – 2023-08-01 (×3): 5 mg via ORAL
  Filled 2023-07-31 (×3): qty 1

## 2023-07-31 MED ORDER — NITROFURANTOIN MONOHYDRATE/MACROCRYSTALS 100 MG CAPSULE
100.0000 mg | ORAL_CAPSULE | Freq: Two times a day (BID) | ORAL | 0 refills | Status: DC
Start: 2023-07-31 — End: 2023-08-01
  Filled 2023-07-31: qty 28, 14d supply, fill #0

## 2023-07-31 MED ORDER — NITROFURANTOIN MONOHYDRATE/MACROCRYSTALS 100 MG CAPSULE
100.0000 mg | ORAL_CAPSULE | Freq: Two times a day (BID) | ORAL | Status: DC
Start: 2023-07-31 — End: 2023-07-31

## 2023-07-31 MED ORDER — NITROFURANTOIN MONOHYDRATE/MACROCRYSTALS 100 MG CAPSULE
100.0000 mg | ORAL_CAPSULE | Freq: Two times a day (BID) | ORAL | Status: DC
Start: 2023-07-31 — End: 2023-08-03
  Administered 2023-07-31 – 2023-08-03 (×8): 100 mg via ORAL
  Filled 2023-07-31 (×8): qty 1

## 2023-07-31 NOTE — Care Management Notes (Signed)
 Submitted for BLS ground transport prior auth via [employer] BARROWS [id number]  No auth required form uploaded to careport   No auth required   Submitter: Orlean JINNY Moats, LPN  3/73/7974 88:82  [phone]

## 2023-07-31 NOTE — Nurses Notes (Signed)
 07/31/23 0630   Urine Assessment   Bladder Scan Performed Y   Estimated Bladder Volume 501 mL     No bladder distention or urge to void noted. Saiesh Voppuru paged.

## 2023-07-31 NOTE — Nurses Notes (Signed)
 Latest Reference Range & Units 07/31/23 09:52   aPTT 24.3 - 37.6 seconds 79.6 (H)   (H): Data is abnormally high    Heparin  gtt therapeutic x4. Next PTT with morning labs.

## 2023-07-31 NOTE — Progress Notes (Signed)
 [facility]  Thoracic Surgery   Progress Note      Atha, Muradyan  Date of Admission:  07/24/2023  Date of Birth:  1970/06/21    Hospital Day:  LOS: 7 days   Date of Service:  07/31/2023    Subjective   Patient developed urinary retention overnight. Straight cath x1. Able to void 600 mL on own this AM. PVR 0. Patient reports feeling well.     Output:  CT 200    Objective      Current Diet:    MNT PROTOCOL FOR DIETITIAN  DIET REGULAR Consistency/thickening: Solid: LEVEL 7: Easy to Chew    Current Medications:  acetaminophen  (TYLENOL ) tablet, 650 mg, Oral, Q4H PRN  atorvastatin  (LIPITOR) tablet, 20 mg, Oral, QPM  Correction/SSIP insulin  lispro 100 units/mL injection, 2-9 Units, Subcutaneous, 4x/day AC  D5W 250 mL flush bag, , Intravenous, Q15 Min PRN  dextrose  (GLUTOSE) 40% oral gel, 15 g, Oral, Q15 Min PRN  dextrose  50% (0.5 g/mL) injection - syringe, 12.5 g, Intravenous, Q15 Min PRN  [START ON 08/03/2023] ergocalciferol-vitamin D2 (DRISDOL) 8,000 units per mL oral drops, 50,000 Units, Oral, Q7 Days  escitalopram  (LEXAPRO ) tablet, 20 mg, Oral, Daily  famotidine  (PEPCID ) tablet, 20 mg, Oral, Daily  glucagon  injection 1 mg, 1 mg, IntraMUSCULAR, Once PRN  heparin  25,000 units in 1/2 NS 250 mL infusion, 18 Units/kg/hr (Adjusted), Intravenous, Continuous  hydrALAZINE (APRESOLINE) injection 10 mg, 10 mg, Intravenous, Q6H PRN  levothyroxine  (SYNTHROID ) tablet, 25 mcg, Oral, QAM  losartan  (COZAAR ) tablet, 100 mg, Oral, Daily  nitrofurantoin monohydrate macrocrystal (MACROBID) capsule, 100 mg, Oral, 2x/day-Food  NS 250 mL flush bag, , Intravenous, Q15 Min PRN  NS bolus infusion 40 mL, 40 mL, Intravenous, Once PRN  NS flush syringe, 2-6 mL, Intracatheter, Q8HRS  NS flush syringe, 2-6 mL, Intracatheter, Q1 MIN PRN  oxyCODONE (ROXICODONE) immediate release tablet, 5 mg, Oral, Q4H PRN  phenytoin  sodium extended release (DILANTIN ) capsule, 300 mg, Oral, 2x/day  sennosides-docusate sodium  (SENOKOT-S) 8.6-50mg  per tablet, 1  Tablet, Oral, 2x/day  tamsulosin  (FLOMAX ) capsule, 0.4 mg, Oral, Daily after Dinner      Vital Signs:  Temp (24hrs) Max:36.8 C (98.3 F)    Temperature: 36.6 C (97.9 F)  BP (Non-Invasive): 122/68  MAP (Non-Invasive): 85 mmHG  Heart Rate: 91  Respiratory Rate: 18  SpO2: 98 %    Base (Admission) Weight:  Weight: 83.2 kg (183 lb 6.8 oz)  Weight:  Weight: 82.5 kg (181 lb 14.1 oz)      Physical Exam:    General: no acute distress, appears stated age  Head: normocephalic, atraumatic  Eyes anicteric, extraocular movements are intact  Neck: trachea midline, no masses, no JVD  Chest: clear bilaterally, normal excursion   Heart: RRR  Abdomen: soft, non-distended, no masses or hernia   Extremeties: no edema, cyanosis  Skin: normal turgor, non-icteric, no obvious rashes           Surgical wounds clean dry and intact  Neuro: no focal neurological deficit, no tremor  Psych: normal affect and judgement        Labs:  I have reviewed all pertinent results as below:  BMP (Last 24 Hours):    Recent Results last 24 hours     07/31/23  0952   SODIUM 138   POTASSIUM 3.8   CHLORIDE 107   CO2 23   BUN 7*   CREATININE 0.61   CALCIUM  8.5*       Radiology:  (images  and reports personally reviewed):              ASSESSMENT:  Patient is a 53 year old female who was transferred to [facility] for a 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 OR 6/20 for robotic-assisted diaphragm hernia repair, gastropexy, and PEG placement. Patient found to have UTI 6/25. Urology consulted in setting of recent nephrectomy. Started on Nitrofurantoin for 14 day course. Will follow up in urology clinic in 7-10 days.     PLAN:  Left diaphragmatic hernia s/p robotic-assisted diaphragm hernia repair, gastropexy, and PEG placement on 6/20                -Advance to soft diet  -Continue soft diet for 6 weeks (8/5)    -Consult nutrition for dietary education                -Continue  to clamp PEG tube                              -Vent tube q6hr or as needed                              -Connect PEG tube to gravity when patient on CPAP at night                 -Abdominal JP removed 6/25                              -Abdominal fluid amylase negative                -Left chest tube d/c 6/27                -AM labs and CXR    Complicated urinary tract infection  Urinary retention    -Urology consulted in setting of recent left nephrectomy with partial ureterectomy, adrenalectomy 06/19/23    -Recommend 14 day course of Nitrofurantoin (Final dose 7/10)   -Straight cath required 6/23, 6/25    -Patient able to void on own AM of 6/26, will hold off on foley for now   -Post void residuals Q void   -Strict I/O q hour      Acute pulmonary emboli   -CTA 07/16/23 showed acute pulmonary emboli in segmental and subsegmental branches   -Heparin  gtt d/c 6/26  -Restart Eliquis  5 mg BID 6/26   -Patient completed loading dose after last admission     Obstructive Sleep Apnea                 -Restart home CPAP                              -Patient's G-tube to MUST BE to gravity bag QHS while patient is using CPAP    Chronic Medical Conditions:  Appropriate Home Meds restarted:  Yes     Gastroesophageal Reflux Disease                 -Continue home Pepcid      Hypertension  Hyperlipidemia                 -Holding home Losartan                 -  Hydralazine 10mg   Q6h PRN   -Continue home Lipitor      Type II Diabetes Mellitus                -Holding home Ozempic                  -SSI      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  Seizure Disorder  Mood Disorder                -Continue home Dilantin                  -Continue home Lexapro      Hypothyroidism                 -Continue home Synthroid     DVT/PE prophylaxis: Continue home Eliquis    Bowel regimen/last BM: Senokot  / Date of Last Bowel Movement: 07/30/23  Therapy:  PT/OT   Disposition Planning: Skilled Nursing Unit     I  independently of the faculty provider spent a total of (20) minutes in direct/indirect care of this patient including initial evaluation, review of laboratory, radiology, diagnostic studies, review of medical record, order entry and coordination of care.      Rankin Gosling, PA-C 07/31/2023. 10:59

## 2023-07-31 NOTE — Care Management Notes (Signed)
 [facility]  Care Management Note    Patient Name: Elizabeth [name]  Date of Birth: [birth year]  Sex: female  Date/Time of Admission: 07/24/2023 12:11 AM  Room/Bed: 08/A  Payor: BLUE [employer]UE [employer] / Plan: [place] [employer] PPO / Product Type: PPO /    LOS: 7 days   Primary Care Providers:  Pcp, No (General)    Admitting Diagnosis:  Diaphragmatic hernia [K44.9]    Assessment:      07/31/23 9072   Assessment Details   Assessment Type Continued Assessment   Date of Care Management Update 07/31/23   Date of Next DCP Update 08/01/23   Care Management Plan   Discharge Planning Status plan in progress   Projected Discharge Date 08/01/23   Discharge plan discussed with: Patient   CM will evaluate for rehabilitation potential yes   Discharge Needs Assessment   Discharge Facility/Level of Care Needs Acute Rehab Placement/Return (not psych)(code 62)         Discharge Plan:  Acute Rehab Placement/Return (not psych) (code 87)  Per service, pt will be medically ready for discharge tomorrow. Arrangements with [facility] to transport patient to [facility] if the auth from insurance is obtained on Thursday. If shara is not obtained on Thursday, other arrangements will need to be made to transport the patient over the weekend.   Spoke to [employer] liaison at [phone] to inform her about the transportation issue. Pt's sister Elizabeth [name] states that they are not able to finish reliable vehicles to transport patient to [place]. Will continue to wait for communication from [place] when shara is obtained.    The patient will continue to be evaluated for developing discharge needs.     Case Manager: Saul Lane, CASE MANAGER  Phone: [phone]

## 2023-07-31 NOTE — Nurses Notes (Signed)
 Olam, sister, updated on patient via telephone. All questions answered.

## 2023-07-31 NOTE — Nurses Notes (Signed)
 07/31/23 1853   Urine Assessment   Estimated Bladder Volume 450 mL     1900 Service notified of no void for 6 hours and bladder scan @ 450. Order for straight cath.     2000 Pt voided 500 on their own. PVR 0. Straight cath no longer indicated.

## 2023-07-31 NOTE — Respiratory Therapy (Signed)
 Pt refusing use of CPAP tonight

## 2023-07-31 NOTE — Care Plan (Signed)
 [facility]    [facility]  Medical Nutrition Therapy Screen Note                                      Date of Service: 07/31/2023    Reason for Note: Consult: diet education Soft diet.    Current Diet Order/Nutrition Support:  MNT PROTOCOL FOR DIETITIAN  DIET REGULAR Consistency/thickening: Solid: LEVEL 7: Easy to Chew    Reviewed patient status, diet order/TF/TPN, labs and medications.  Height: 160 cm (5' 3)   Weight: 82.5 kg (181 lb 14.1 oz) (07/30/23 0330)   Body mass index is 32.22 kg/m.    Brief Subjective:   Met with pt at bedside to discuss soft diet. Discussed what the soft diet is for. Pt asked if blueberries where ok. Explained to pt she would want to avoid foods with skins and seeds. Explained to pt how to prepare soft foods. Inform pt she can make smoothies and run it through a fine mesh strainer to remove skins and seeds. Pt asked how long she should be on a soft diet. Informed her that's is a question for her doctors. Per progress notes pt is to be on a soft diet for 6 weeks. Pt had no other questions regarding diet at the time of visit.     Most Recent Screen Previous screen   Impaired Nutrition Status Score Impaired Nutrition Status Score: 1 - Food intake below 50 - 75% of normal requirement in preceding week - Mild (07/30/23 0800)   Impaired Nutrition Status Score: 1 - Food intake below 50 - 75% of normal requirement in preceding week - Mild (07/30/23 0800)           Severity of Disease Score Severity of Disease Score: 1 - Mild (07/30/23 0800)   Severity of Disease Score: 1 - Mild (07/30/23 0800)       Age Age: 55 - < 70 years (07/30/23 0800) Age: 55 - < 70 years (07/30/23 0800)     Score Score: 2 (07/30/23 0800)     Score: 2 (07/30/23 0800)   Risk Level Risk Level: Level 2 - 2 pts - No initial note required (07/30/23 0800)   Risk Level: Level 2 - 2 pts - No initial note required (07/30/23 0800)           Nutrition related problems: None identified at this  time    Assessment: No assessment at this time    Plan/Intervention:   Monitor po intake  Continue current diet  Monitor weights 2x a week for trend    Maurilio Flax, NDTR   Pager#[phone]

## 2023-07-31 NOTE — Nurses Notes (Signed)
 07/31/23 1000 07/31/23 1044   Urine Assessment   Bladder Scan Performed Y Y   Estimated Bladder Volume 630 mL 0 mL     1000 Service notified of no void for more than 6 hours and bladder scan @ 630. Order placed for foley catheter.    1044 Patient voided 600. PVR 0. Service notified and order to hold off on foley.

## 2023-07-31 NOTE — Pharmacy (Signed)
 Pharmacy Medication Reconciliation    Patient Name: Elizabeth [name], Elizabeth [name]  Date of Service: 07/31/2023  Date of Admission: 07/24/2023  Date of Birth: 1970-07-16  Length of Stay:   7 days   Service: THORACIC SURGERY      Transitions of Care:  Discharge Pharmacy services were discussed with the patient and the Meds to [facility] flowsheet and preferred pharmacy information were updated in EMR if applicable and able to assess with patient.    Information was collected from:  Patient and Pharmacy    [facility] [phone] [address] [place] [place] - [address]  [address]  [place] [address]  Phone: [phone] Fax: [phone]    [facility] - [facility]  [address]  [place] [address]  Phone: [phone] Fax: [phone]    OF NOTE;    I spoke with the patient Roselinda) today via bedside phone. She knew limited information about her home medications and stated that [facility] [place] [facility] accurate [facility] of her home medications. I did try listing her home medications to see if she recognized any of them, but she was very unsure. I asked if patient had anyone who helped with medications at home and she stated no.     Does the Patient have Prescription Coverage? Yes    Clarified Prior to Admission Medications:  Prior to Admission medications    Medication Sig Taking Resumed Y/N (RPh) Comments   apixaban  (ELIQUIS ) 5 mg Oral Tablet Take 1 Tablet (5 mg total) by mouth Twice daily for 30 days Yes  yes Filled 07/19/23 for 30 days    Patient thinks she is taking this at home, said it sounds familiar but wasn't certain.     atorvastatin  (LIPITOR) 20 mg Oral Tablet Take 1 Tablet (20 mg total) by mouth Every night     yes Filled 05/01/23 for 90 days   ergocalciferol, vitamin D2, (DRISDOL) 1,250 mcg (50,000 unit) Oral Capsule Take 1 Capsule (50,000 Units total) by mouth Every Sunday Yes  yes Filled 07/21/23 for 84 days    Patient stated she takes on Sundays       escitalopram  oxalate (LEXAPRO ) 20 mg Oral Tablet   Take 1 Tablet (20 mg total) by mouth Daily   yes Filled 05/03/23 for 90 days   famotidine  (PEPCID ) 20 mg Oral Tablet   Take 1 Tablet (20 mg total) by mouth Daily   yes Filled 05/01/23 for 90 days   FREESTYLE LANCETS 28 gauge Misc   TEST ONCE DAILY   --     FREESTYLE LITE METER Kit   EVERY DAY   --     FREESTYLE LITE STRIPS Strip   TEST EVERY DAY   --     hydroCHLOROthiazide  (HYDRODIURIL ) 25 mg Oral Tablet   Take 1 Tablet (25 mg total) by mouth Every morning   no Filled 05/01/23 for 90 days   levothyroxine  (SYNTHROID ) 25 mcg Oral Tablet TAKE 1 TABLET ONCE A DAY IN THE MORNING 30 MINUTES BEFORE MEALS OR ANY OTHER MEDICATIONS Yes  yes Filled 05/12/23 for 90 days    Patient knew she was taking synthroid  but was unsure of the dose.     losartan  (COZAAR ) 100 mg Oral Tablet   Take 1 Tablet (100 mg total) by mouth Daily   yes Filled 05/01/23 for 90 days   magnesium  Oxide 420 mg Oral Tablet   Take 1 Tablet (420 mg total)  by mouth Daily   no Patient is not taking   methocarbamoL  (ROBAXIN ) 500 mg Oral Tablet Take 1 Tablet (500 mg total) by mouth Three times a day as needed  Patient not taking: Reported on 07/24/2023     no Filled 05/16/23 for 90 days   nitrofurantoin monohyd/m-cryst (MACROBID) 100 mg Oral Capsule   Take 1 Capsule (100 mg total) by mouth Twice daily with food for 28 doses   yes No fill history    phenytoin  sodium extended release (DILANTIN ) 100 mg Oral Capsule TAKE 3 CAPSULES BY MOUTH TWICE A DAY Yes  yes Filled 05/14/23 for 90 days    Patient knew she is taking this med. Stated she takes daily      semaglutide  (OZEMPIC ) 0.25 mg or 0.5 mg(2 mg/1.5 mL) Subcutaneous Pen Injector Inject 0.5 mg under the skin Every 7 days   no Last filled 03/26/23 for 90 days     Did patient's home medication list require updates? No    Medications UPDATED on Prior to Admission Med List:  none    Medications ADDED to Prior to Admission Med List:  none    Allergies:    Allergies   Allergen  Reactions    Ace Inhibitors      Other reaction(s): Angioedema       Schuyler Josette Grounds    Did pharmacist make suggestions for medication reconciliation? Yes    Pharmacist Recommendations:  - consider warfarin instead of apixaban  given DDI with phenytoin     Lauraine DELENA Pinal, PHARMD

## 2023-08-01 ENCOUNTER — Other Ambulatory Visit: Payer: Self-pay

## 2023-08-01 LAB — POC BLOOD GLUCOSE (RESULTS)
GLUCOSE, POC: 107 mg/dL (ref 65–125)
GLUCOSE, POC: 105 mg/dL (ref 65–125)
GLUCOSE, POC: 107 mg/dL (ref 65–125)
GLUCOSE, POC: 145 mg/dL — ABNORMAL HIGH (ref 65–125)

## 2023-08-01 LAB — CBC
HCT: 31.6 % — ABNORMAL LOW (ref 34.8–46.0)
HGB: 10.7 g/dL — ABNORMAL LOW (ref 11.5–16.0)
MCH: 31.5 pg (ref 26.0–32.0)
MCHC: 33.9 g/dL (ref 31.0–35.5)
MCV: 92.9 fL (ref 78.0–100.0)
MPV: 8.9 fL (ref 8.7–12.5)
PLATELETS: 244 10*3/uL (ref 150–400)
RBC: 3.4 10*6/uL — ABNORMAL LOW (ref 3.85–5.22)
RDW-CV: 13.5 % (ref 11.5–15.5)
WBC: 6.8 10*3/uL (ref 3.7–11.0)

## 2023-08-01 LAB — PHOSPHORUS: PHOSPHORUS: 3.9 mg/dL (ref 2.4–4.7)

## 2023-08-01 LAB — BASIC METABOLIC PANEL
ANION GAP: 8 mmol/L (ref 4–13)
BUN/CREA RATIO: 12 (ref 6–22)
BUN: 7 mg/dL — ABNORMAL LOW (ref 8–25)
CALCIUM: 8.9 mg/dL (ref 8.6–10.2)
CHLORIDE: 108 mmol/L (ref 96–111)
CO2 TOTAL: 24 mmol/L (ref 22–30)
CREATININE: 0.59 mg/dL — ABNORMAL LOW (ref 0.60–1.05)
ESTIMATED GFR - FEMALE: >90 mL/min/BSA (ref >=60)
GLUCOSE: 98 mg/dL (ref 65–125)
POTASSIUM: 4 mmol/L (ref 3.5–5.1)
SODIUM: 140 mmol/L (ref 136–145)

## 2023-08-01 LAB — MAGNESIUM: MAGNESIUM: 1.8 mg/dL (ref 1.8–2.6)

## 2023-08-01 MED ORDER — HYDROCHLOROTHIAZIDE 25 MG TABLET
25.0000 mg | ORAL_TABLET | Freq: Every morning | ORAL | Status: AC
Start: 2023-08-01 — End: ?

## 2023-08-01 MED ORDER — ENOXAPARIN 80 MG/0.8 ML SUBCUTANEOUS SYRINGE
1.0000 mg/kg | INJECTION | Freq: Two times a day (BID) | SUBCUTANEOUS | Status: DC
Start: 2023-08-01 — End: 2023-08-03
  Administered 2023-08-01 – 2023-08-03 (×5): 80 mg via SUBCUTANEOUS
  Filled 2023-08-01 (×6): qty 0.8

## 2023-08-01 MED ORDER — METHOCARBAMOL 500 MG TABLET
500.0000 mg | ORAL_TABLET | Freq: Four times a day (QID) | ORAL | Status: DC | PRN
Start: 2023-08-01 — End: 2023-08-03
  Filled 2023-08-01: qty 1

## 2023-08-01 MED ORDER — ENOXAPARIN 80 MG/0.8 ML SUBCUTANEOUS SYRINGE
1.0000 mg/kg | INJECTION | Freq: Two times a day (BID) | SUBCUTANEOUS | Status: DC
Start: 2023-08-01 — End: 2023-10-15

## 2023-08-01 MED ORDER — METHOCARBAMOL 500 MG TABLET
500.0000 mg | ORAL_TABLET | Freq: Four times a day (QID) | ORAL | Status: AC | PRN
Start: 2023-08-01 — End: ?

## 2023-08-01 MED ORDER — ENOXAPARIN 80 MG/0.8 ML SUBCUTANEOUS SYRINGE
1.0000 mg/kg | INJECTION | Freq: Two times a day (BID) | SUBCUTANEOUS | 1 refills | Status: DC
Start: 2023-08-01 — End: 2023-08-01
  Filled 2023-08-01: qty 48, 30d supply, fill #0

## 2023-08-01 MED ORDER — OXYCODONE 5 MG TABLET
5.0000 mg | ORAL_TABLET | Freq: Four times a day (QID) | ORAL | 0 refills | Status: AC | PRN
Start: 2023-08-01 — End: ?

## 2023-08-01 MED ORDER — NITROFURANTOIN MONOHYDRATE/MACROCRYSTALS 100 MG CAPSULE
100.0000 mg | ORAL_CAPSULE | Freq: Two times a day (BID) | ORAL | Status: DC
Start: 2023-08-01 — End: 2023-08-15

## 2023-08-01 MED ORDER — METHOCARBAMOL 500 MG TABLET
500.0000 mg | ORAL_TABLET | Freq: Four times a day (QID) | ORAL | 0 refills | Status: DC | PRN
Start: 2023-08-01 — End: 2023-08-01
  Filled 2023-08-01: qty 20, 5d supply, fill #0

## 2023-08-01 NOTE — H&P (Signed)
 [facility]  Hematology/Oncology Consult     [name], 53 y.o. female  Date of Birth:  November 14, 1970  Date of Admission:  07/24/2023  Date of service: 08/01/2023    Service: THORACIC SURGERY  Requesting MD: Fredonia Lis, MD     Reason for consultation: PE - on Eliquis      HPI:  Elizabeth [name] is a 53 y.o., White female with a history of RCC s/p resection,  remote astrocytoma brain cancer s/p resection, on Phenytoin  ; recent PE on eliquis , DM, HTN, HL, OSA presents as a transfer for diaphragmatic hernia repair. Patient underwent a robotic assisted diaphragm hernia repair, gastropexy and PEG placement on 07/25/23.   Of note: Patient presents to the hospital earlier in June with c/o L anterior chest pain. CTA chest on 07/16/23 showed acute pulmonary emboli in segmental and subsegmental left lower lobe branches. 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 The PE was considered to be provoked 2/2 underlying malignancy.  Hematology and oncology have been consulted for [facility] recs given interaction between Eliquis  and phenytoin .  On presentation, patient is resting in chair. Denies any new complaints.       ROS    Negative except for above     PMH/PSH  Past Medical History:   Diagnosis Date    Anemia     Cancer (CMS HCC)     brain tumor 8/15 with chemo and radiation    Chest pain, unspecified type 07/17/2023    CPAP (continuous positive airway pressure) dependence     CVA (cerebrovascular accident)     mini during brain surgery per pt    Diabetes mellitus, type 2     Disorder of thyroid      Dyspnea, unspecified type 07/17/2023    Epileptic seizure (CMS HCC)     Essential hypertension     History of kidney surgery 06/2023    Pulmonary embolism 07/16/2023    Sleep apnea          Past Surgical History:   Procedure Laterality Date    BRAIN SURGERY      HX CHOLECYSTECTOMY            Allergies[1]  Family History  Family Medical History:       Problem Relation (Age of Onset)    Arthritis-osteo Mother    Breast Cancer Paternal Grandmother    Heart Disease Father    Hypertension (High Blood Pressure) Mother    No Known Problems Sister, Brother, Maternal Grandmother, Maternal Grandfather, Paternal Grandfather, Daughter, Son, Maternal Aunt, Maternal Uncle, Paternal Aunt, Paternal Uncle, Other            Medications  Inpatient:  acetaminophen  (TYLENOL ) tablet, 650 mg, Oral, Q4H PRN  apixaban  (ELIQUIS ) tablet, 5 mg, Oral, 2x/day  atorvastatin  (LIPITOR) tablet, 20 mg, Oral, QPM  Correction/SSIP insulin  lispro 100 units/mL injection, 2-9 Units, Subcutaneous, 4x/day AC  D5W 250 mL flush bag, , Intravenous, Q15 Min PRN  dextrose  (GLUTOSE) 40% oral gel, 15 g, Oral, Q15 Min PRN  dextrose  50% (0.5 g/mL) injection - syringe, 12.5 g, Intravenous, Q15 Min PRN  [START ON 08/03/2023] ergocalciferol -vitamin D2 (DRISDOL ) 8,000 units per mL oral drops, 50,000 Units, Oral, Q7 Days  escitalopram  (LEXAPRO ) tablet, 20 mg, Oral, Daily  famotidine  (PEPCID ) tablet, 20 mg, Oral, Daily  glucagon  injection 1 mg, 1 mg, IntraMUSCULAR, Once PRN  hydrALAZINE  (APRESOLINE ) injection 10 mg, 10 mg, Intravenous, Q6H PRN  levothyroxine  (SYNTHROID ) tablet, 25 mcg, Oral, QAM  losartan  (COZAAR ) tablet, 100 mg, Oral, Daily  nitrofurantoin  monohydrate macrocrystal (MACROBID ) capsule, 100 mg, Oral, 2x/day-Food  NS 250 mL flush bag, , Intravenous, Q15 Min PRN  NS bolus infusion 40 mL, 40 mL, Intravenous, Once PRN  NS flush syringe, 2-6 mL, Intracatheter, Q8HRS  NS flush syringe, 2-6 mL, Intracatheter, Q1 MIN PRN  oxyCODONE  (ROXICODONE ) immediate release tablet, 5 mg, Oral, Q4H PRN  phenytoin  sodium extended release (DILANTIN ) capsule, 300 mg, Oral, 2x/day  sennosides-docusate sodium  (SENOKOT-S) 8.6-50mg  per tablet, 1 Tablet, Oral, 2x/day  tamsulosin  (FLOMAX ) capsule, 0.4 mg, Oral, Daily after Dinner        EXAM:  General: NAD  Head:  Normocephalic, atraumatic  Eyes: sclareae non-icteric, conjunctivae clear.  Throat: Oropharynx clear, mucous membranes moist.  Cardiovascular: RRR  Respiratory: CTABL  Abdominal: Bowel sounds normal  Extremities: no peripheral edema.  Skin: Warm and dry.  Neurological: Awake, A&O x 3.   Psychiatric: Normal mood & affect. Thought process linear. Speech content normal    Labs: I have reviewed lab results from last 72hrs.       Studies: I have reviewed pertinent available studies within the electronic medical record.       Assessment/Recommendation(s):  Elizabeth, [name], is a 53 y.o. female with a history of RCC s/p resection,  remote astrocytoma brain cancer s/p resection; recent PE on eliquis , DM, HTN, HL, OSA presents as a transfer for diaphragmatic hernia repair. Hematology and oncology have been consulted regarding [facility] recs    #Diaphragmatic hernia repair s/p robotic assisted diaphragm hernia repair, gastropexy and PEG placement on 07/25/23.   #Hx of PE - on Eliquis   #Hx of RCC s/p nephrectomy - 2025  #Hx of astrocytoma s/p craniotomy with ChemoRT in 2015  #Hx of seizures - on phenytoin   Patient presents for diaphragmatic hernia repair  Patient has been on Eliquis  since 07/16/2023 for new onset provoked PE 2/2 recent nephrectomy for RCC.  Patient has been on on phenytoin  for several years  Hematology and Oncology have been consulted regarding AC recs given interaction between Eliquis  and Phenytoin     #Recommendations:  Patient has been on phenytoin  for several years without any additional episodes of seizures  Phenytoin  is a strong inducer of CYP3A4 which would decrease the concentration and hence efficacy of Eliquis   There is not interaction between phenytoin  and Lovenox  Spoke to patient whether she would be okay with Lovenox injections. Patient agreeable  Would recommend switching Eliquis  to Lovenox 1 mg/kg BID for a minimum of 6 months, can consider longer since patient has underlying malignancy  Patient has  follow up with Dr.Mackey at [facility] on 08/13/2023   Thank you for the consult, we will sign off now    Wynell Bathe, MD  [place]  [facility]  Hematology/Oncology PGY-IV    I personally have seen and evaluated the patient.   My findings are in agreement with the above assessment and plan as indicated in the fellow's note.      Render Elizabeth Plush, MD          [1]   Allergies  Allergen Reactions    Ace Inhibitors      Other reaction(s): Angioedema

## 2023-08-01 NOTE — Nurses Notes (Signed)
 Patient PVR 360. Paged service.

## 2023-08-01 NOTE — Care Plan (Signed)
 [facility]  Rehabilitation Services  Occupational Therapy Progress Note    Patient Name: Elizabeth [name]  Date of Birth: Nov 11, 1970  Height:  160 cm (5' 3)  Weight:  82.5 kg (181 lb 14.1 oz)  Room/Bed: 32/A  Payor: BLUE CROSS BLUE SHIELD / Plan: [place] BCBS PPO / Product Type: PPO /     Assessment:    Pt tolerated OT tx session well this date, she endorsed increased pain this AM but demonstrated improved activity tolerance and OOB participation. Pt required min A for bed mobility to fully get R elbow underneath self while in side lying then could effectively push self up. Pt adjusted socks at EOB w/ CGA for safety and completed STS to FWW w/ CGA and VCs for hand placement. Pt completed pivot to chair w/ hands on assist then was taken into hall for ambulation. Pt able to complete functional ambulation w/ min A-CGA for safety and to steady tolerating <household distance and fatiguing quickly. At this time, recommend d/c to IPR vs SNF for further skilled interdisciplinary care as at CLOF pt would be unable to return home safely d/t limited support.      Discharge Needs:   Equipment Recommendation: none anticipated      Discharge Disposition:  skilled nursing facility, inpatient rehabilitation facility     JUSTIFICATION OF DISCHARGE RECOMMENDATION   Based on current diagnosis, functional performance prior to admission, and current functional performance, this patient requires continued OT services in skilled nursing facility, inpatient rehabilitation facility in order to achieve significant functional improvements.    Plan:   Continue to follow patient according to established plan of care.  The risks/benefits of therapy have been discussed with the patient/caregiver and he/she is in agreement with the established plan of care.     Subjective & Objective:      08/01/23 1055   Therapist Pager   OT Assigned/ Pager # [phone]   Rehab Session   Document Type therapy progress note (daily note)   OT Visit Date  08/01/23   Total OT Minutes: 16   Patient Effort good   Symptoms Noted During/After Treatment fatigue;increased pain   Symptoms Noted Comment pain in abdomen and sacrum   General Information   Patient Profile Reviewed yes   General Observations of Patient pt seen b/s and was agreeable for tx   Medical Lines PIV Line;Telemetry   Respiratory Status room air   Existing Precautions/Restrictions fall precautions;full code   Pre Treatment Status   Pre Treatment Patient Status Patient supine in bed;Call light within reach;Telephone within reach;Patient safety alarm activated;Nurse approved session   Support Present Pre Treatment  None   Communication Pre Treatment  Nurse   Communication Pre Treatment Comment Nurse approved   Mutuality/Individual Preferences   Individualized Care Needs OOB via FWW Ax1; encourage ADL participation   Vital Signs   O2 Delivery Pre Treatment room air   O2 Delivery Post Treatment room air   Vitals Comment VSS   Pain Assessment   Pre/Posttreatment Pain Comment c/o pain in abdomen and scarum, did not formally rate. RN aware   Coping/Psychosocial   Observed Emotional State calm;cooperative   Coping/Psychosocial Response Interventions   Plan Of Care Reviewed With patient   Cognition   Behavior/Mood Observations alert;cooperative   Attention mild impairment;distractible   Follows Commands follows multi-step commands;repetition of directions required;increased processing time needed;initiation impaired   Mobility Assessment/Training   Mobility Comment Pt completed functional ambulation w/ FWW and min A-CGA. Decreased  balance during turns and low tolerance. Pt ambulated <household distance   Bed Mobility   Supine-Sit Independence minimum assist (75% patient effort)   Bed Mobility, Assistive Device Head of Bed Elevated   Comment min A to assist pt onto elbow while in side lying then she was able to push self upright   Safety Issues decreased use of arms for pushing/pulling   Impairments coordination  impaired;flexibility decreased;strength decreased   Transfer Assessment/Treatment   Sit-Stand Independence contact guard assist;verbal cues required   Stand-Sit Independence contact guard assist;verbal cues required   Sit-Stand-Sit, Assist Device walker, front wheeled   Bed-Chair Independence contact guard assist;verbal cues required   Bed-Chair-Bed Assist Device walker, front wheeled   Transfer Safety Issues balance decreased during turns;step length decreased   Transfer Impairments balance impaired;coordination impaired;endurance;pain;postural control impaired;strength decreased   Transfer Comment VCs for safety and hand placement   Lower Body Dressing Assessment/Training   Comment CGA to manage socks via figure four at EOB   Balance   Comment w/ FWW   Sitting Balance: Static fair + balance   Sitting, Dynamic (Balance) fair balance   Sit-to-Stand Balance fair - balance   Standing Balance: Static fair balance   Standing Balance: Dynamic fair - balance   Systems Impairment Contributing to Balance Disturbance musculoskeletal   Identified Impairments Contributing to Balance Disturbance decreased strength;impaired postural control;pain;impaired coordination   Post Treatment Status   Post Treatment Patient Status Patient sitting in bedside chair or w/c;Call light within reach;Telephone within reach;Patient safety alarm activated   Support Present Post Treatment  None   Communication Post Treatement Nurse   Communication Post Treatment Comment pt performance and transfer recs   Clinical Impression   Functional Level at Time of Session Pt tolerated OT tx session well this date, she endorsed increased pain this AM but demonstrated improved activity tolerance and OOB participation. Pt required min A for bed mobility to fully get R elbow underneath self while in side lying then could effectively push self up. Pt adjusted socks at EOB w/ CGA for safety and completed STS to FWW w/ CGA and VCs for hand placement. Pt completed  pivot to chair w/ hands on assist then was taken into hall for ambulation. Pt able to complete functional ambulation w/ min A-CGA for safety and to steady tolerating <household distance and fatiguing quickly. At this time, recommend d/c to IPR vs SNF for further skilled interdisciplinary care as at CLOF pt would be unable to return home safely d/t limited support.   Anticipated Equipment Needs at Discharge none anticipated   Anticipated Discharge Disposition skilled nursing facility;inpatient rehabilitation facility         Therapist:   Warrick Canales, COTA  Pager #: [phone]

## 2023-08-01 NOTE — Care Plan (Signed)
 [facility]  Rehabilitation Services  Physical Therapy Progress Note      Patient Name: Elizabeth [name]  Date of Birth: Jun 24, 1970  Height:  160 cm (5' 3)  Weight:  82.5 kg (181 lb 14.1 oz)  Room/Bed: 32/A  Payor: BLUE CROSS BLUE SHIELD / Plan: [place] BCBS PPO / Product Type: PPO /     Assessment:     [name] tolerated today's session well. Pt displays increased BLE fatigue during functional mobility. Required Min A for supine to sit and CGA for STS/SPT transfers at [facility]. She ambulated ~16ft with FWW, Min A, and chair follow. PT recommends IRF vs SNF placement to address deficits in strength, endurance, balance, coordination, ROM, and posture control once medically cleared for d/c.    Discharge Needs:   Equipment Recommendation: TBD    Discharge Disposition: inpatient rehabilitation facility, skilled nursing facility    JUSTIFICATION OF DISCHARGE RECOMMENDATION   Based on current diagnosis, functional performance prior to admission, and current functional performance, this patient requires continued PT services in inpatient rehabilitation facility, skilled nursing facility in order to achieve significant functional improvements in these deficit areas: aerobic capacity/endurance, arousal, attention, and cognition, gait, locomotion, and balance.      Plan:   Continue to follow patient according to established plan of care.  The risks/benefits of therapy have been discussed with the patient/caregiver and he/she is in agreement with the established plan of care.     Subjective & Objective:        08/01/23 1111   Therapist Pager   PT Assigned/ Pager # [phone]   Rehab Session   Document Type therapy progress note (daily note)   PT Visit Date 08/01/23   Total PT Minutes: 16   Patient Effort good   Symptoms Noted During/After Treatment fatigue;increased pain   General Information   Patient Profile Reviewed yes   Medical Lines PIV Line;Telemetry   Respiratory Status room air   Existing  Precautions/Restrictions fall precautions;full code   Mutuality/Individual Preferences   Individualized Care Needs OOB w/FWW Ax1   Pre Treatment Status   Pre Treatment Patient Status Patient supine in bed;Call light within reach;Telephone within reach;Patient safety alarm activated;Nurse approved session   Support Present Pre Treatment  None   Communication Pre Treatment  Nurse   Communication Pre Treatment Comment Nurse approved   Cognition   Behavior/Mood Observations alert;cooperative   Attention mild impairment;distractible   Follows Commands follows multi-step commands;repetition of directions required;increased processing time needed;initiation impaired   Vital Signs   O2 Delivery Pre Treatment room air   O2 Delivery Post Treatment room air   Vitals Comment VSS   Pain Assessment   Pre/Posttreatment Pain Comment Pt c/o of pain in abdomen and sacrum; not formally rated   Bed Mobility   Supine-Sit Independence minimum assist (75% patient effort)   Bed Mobility, Assistive Device Head of Bed Elevated   Safety Issues decreased use of arms for pushing/pulling   Impairments coordination impaired;flexibility decreased;strength decreased   Transfer Assessment/Treatment   Sit-Stand Independence contact guard assist;verbal cues required   Stand-Sit Independence contact guard assist;verbal cues required   Sit-Stand-Sit, Assist Device walker, front wheeled   Bed-Chair Independence contact guard assist;verbal cues required   Bed-Chair-Bed Assist Device walker, front wheeled   Transfer Safety Issues balance decreased during turns;step length decreased   Transfer Impairments balance impaired;coordination impaired;endurance;pain;postural control impaired;strength decreased   Gait Assessment/Treatment   Total Distance Ambulated 10   Independence  minimum assist (  75% patient effort)   Assistive Device  walker, front wheeled   Distance in Feet 68ft   Gait Speed decreased   Deviations  cadence decreased;step length decreased    Safety Issues  balance decreased during turns;step length decreased   Impairments  balance impaired;coordination impaired;endurance;postural control impaired;strength decreased   Balance   Comment w/FWW   Sitting Balance: Static fair + balance   Sitting, Dynamic (Balance) fair balance   Sit-to-Stand Balance fair - balance   Standing Balance: Static fair balance   Standing Balance: Dynamic fair - balance   Systems Impairment Contributing to Balance Disturbance musculoskeletal   Identified Impairments Contributing to Balance Disturbance decreased strength;impaired postural control;pain;impaired coordination   Post Treatment Status   Post Treatment Patient Status Patient sitting in bedside chair or w/c;Call light within reach;Telephone within reach;Patient safety alarm activated   Support Present Post Treatment  None   Communication Post Treatement Nurse   Communication Post Treatment Comment pt performance and transfer recs   Plan of Care Review   Plan Of Care Reviewed With patient   Basic Mobility Am-PAC/6Clicks Score (APPROVED Staff)   Turning in bed without bedrails 3   Lying on back to sitting on edge of flat bed 3   Moving to and from a bed to a chair 3   Standing up from chair 3   Walk in room 3   Climbing 3-5 steps with railing 2   6 Clicks Raw Score total 17   Standardized (t-scale) score 39.67   Patient Mobility Goal (JHHLM) 5- Stand 1 minute or more 3X/day   Exercise/Activity Level Performed 6- Walked 10 steps or more   Physical Therapy Clinical Impression   Assessment [name] tolerated today's session well. Pt displays increased BLE fatigue during functional mobility. Required Min A for supine to sit and CGA for STS/SPT transfers at [facility]. She ambulated ~45ft with FWW, Min A, and chair follow. PT recommends IRF vs SNF placement to address deficits in strength, endurance, balance, coordination, ROM, and posture control once medically cleared for d/c.   Anticipated Equipment Needs at Discharge (PT) TBD    Anticipated Discharge Disposition inpatient rehabilitation facility;skilled nursing facility       Therapist:   Elsie Rase, PTA   Pager #: [phone]

## 2023-08-01 NOTE — Care Management Notes (Addendum)
 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 [facility]  2. [facility] SNF   Discharge Needs Assessment   Equipment Needed After Discharge none   Discharge Facility/Level of Care Needs SNF vs Rehab   Transportation Available other (see comments)   Referral Information   Admission Type inpatient     Pt continues to be followed medically and is DC ready pending insurance auth for placement.     Therapy Recs: PT/OT rec IPR, SNF.     IPR:  received call from [facility] and auth has been approved but we are not able to arrange any transport. Pt is agreeable to [facility], referral sent. Spoke with liaison, Melyssa and they are checking to see if they can get the auth switched.     Addendum: Spoke with Erica and Melyssa with [facility] and they will switch everything to [place]. Plan to DC tomorrow now, service is working with hem/onc to determine anticoag plan    Discharge Plan:  SNF vs Rehab   Anticipate discharge to SNF vs rehab pending insurance auth.     The patient will continue to be evaluated for developing discharge needs.     Case Manager: Rosina Freund, CLINICAL CARE  COORDINATOR  Phone: [phone]

## 2023-08-01 NOTE — Care Management Notes (Signed)
 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 [facility]  2. [facility]   Discharge Needs Assessment   Equipment Needed After Discharge none   Discharge Facility/Level of Care Needs SNF vs Rehab   Transportation Available other (see comments)   Referral Information   Admission Type inpatient     Pt DC ready, awaiting auth for [facility] . CCC called [facility] twice. Left message, awaiting return call.    CCC met with pt at bedside to discuss back up plan if IPR shara is denied. She reported that she would like to go to [facility] in [place]. Referral sent.    Discharge Plan:  SNF vs Rehab   Anticipate discharge to SNF vs rehab pending insurance auth    The patient will continue to be evaluated for developing discharge needs.     Case Manager: Rosina Freund, CLINICAL CARE COORDINATOR  Phone: [phone]

## 2023-08-01 NOTE — Nurses Notes (Signed)
 Patient arrived from 10se-08 via bed and [place] escort. No voiced complaints. Call bell placed within reach and pt educated to call for assistance.

## 2023-08-01 NOTE — Discharge Summary (Signed)
 [facility]  DISCHARGE SUMMARY    PATIENT NAME:  Elizabeth [name], Elizabeth [name]  MRN:  [id number]  DOB:  [birth year]    ENCOUNTER DATE:  07/24/2023  INPATIENT ADMISSION DATE: 07/24/2023  DISCHARGE DATE:  08/03/2023    ATTENDING PHYSICIAN: Fredonia Lis, MD  SERVICE: THORACIC SURGERY  PRIMARY CARE PHYSICIAN: No Pcp       Lay Caregiver Name: Elizabeth [name] Caregiver Contact Number: [phone]   Lay Caregiver Relationship to patient: child    PRIMARY DISCHARGE DIAGNOSIS: Diaphragmatic hernia  Active Hospital Problems    Diagnosis Date Noted    Clear cell renal cell carcinoma, 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] 07/17/2023    Pulmonary embolism [I26.99] 07/16/2023    UTI (urinary tract infection) [N39.0] 08/02/2023    History of left nephrectomy [Z90.5] 07/17/2023      Resolved Hospital Problems    Diagnosis     Principal Problem: Diaphragmatic hernia [K44.9]      Active Non-Hospital Problems    Diagnosis Date Noted    GERD (gastroesophageal reflux disease) 08/02/2023    CVA (cerebrovascular accident)     Hypothyroidism     Diabetes mellitus, type 2     Confusion 07/26/2023    Chest pain, unspecified type 07/17/2023    Dyspnea, unspecified type 07/17/2023    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        Details   enoxaparin 80 mg/0.8 mL Syringe  Commonly known as: LOVENOX   1 mg/kg (80 mg), Subcutaneous, 2 TIMES DAILY  Refills: 0     hydroCHLOROthiazide  25 mg Tablet  Commonly known as: HYDRODIURIL    25 mg, Oral, EVERY MORNING  Refills: 0     nitrofurantoin  monohyd/m-cryst 100 mg Capsule  Commonly known as: MACROBID    100 mg, Oral, 2 TIMES DAILY WITH FOOD  Refills: 0     oxyCODONE  5 mg Tablet  Commonly known as: ROXICODONE    5 mg, Oral, EVERY 6 HOURS PRN  Qty: 20 Tablet  Refills: 0            CONTINUE these medications which have CHANGED during your visit.        Details    methocarbamoL  500 mg Tablet  Commonly known as: ROBAXIN   What changed: when to take this   500 mg, Oral, 4 TIMES DAILY PRN  Refills: 0            CONTINUE these medications - NO CHANGES were made during your visit.        Details   atorvastatin  20 mg Tablet  Commonly known as: LIPITOR   20 mg, NIGHTLY  Refills: 0     ergocalciferol  (vitamin D2) 1,250 mcg (50,000 unit) Capsule  Commonly known as: DRISDOL    Take 1 Capsule (50,000 Units total) by mouth Every Sunday  Refills: 0     escitalopram  oxalate 20 mg Tablet  Commonly known as: LEXAPRO    20 mg, Daily  Refills: 0     famotidine  20 mg Tablet  Commonly known as: PEPCID    20 mg, Daily  Refills: 0     FreeStyle Lancets 28 gauge Misc  Generic drug: lancets   TEST ONCE DAILY  Refills: 0     FreeStyle Lite Meter Kit  Generic drug: Blood-Glucose Meter   EVERY DAY  Refills: 0     FreeStyle Lite Strips Strip  Generic drug: Blood Sugar Diagnostic   TEST EVERY DAY  Refills: 0     levothyroxine  25 mcg Tablet  Commonly known as: SYNTHROID    TAKE 1 TABLET ONCE A DAY IN THE MORNING 30 MINUTES BEFORE MEALS OR ANY OTHER MEDICATIONS  Refills: 0     losartan  100 mg Tablet  Commonly known as: COZAAR    100 mg, Daily  Refills: 0     magnesium  Oxide 420 mg Tablet   420 mg, Daily  Refills: 0     Ozempic  0.25 mg or 0.5 mg(2 mg/1.5 mL) Pen Injector  Generic drug: semaglutide    0.5 mg, EVERY 7 DAYS  Refills: 0     phenytoin  sodium extended release 100 mg Capsule  Commonly known as: DILANTIN    TAKE 3 CAPSULES BY MOUTH TWICE A DAY  Refills: 0            STOP taking these medications.      apixaban  5 mg Tablet  Commonly known as: ELIQUIS             Discharge med list refreshed?  YES   Warfarin: No    Allergies[1]  HOSPITAL PROCEDURE(S):   No orders of the defined types were placed in this encounter.    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 Patient was discharged home in stable condition 06/24/23. Patient represented to ED 07/16/23 with complaints of SOB and chest pain. CTA chest at that time showed acute segmental and subsegmental PE in left lower lobe branches, no evidence of right heart strain on cardiac workup. A new stomach containing diaphragmatic hernia with air fluid levels was present on this scan. She was started on heparin  drip. Heme/onc consulted. Recommend transitioning from heparin  drip to Eliquis  10 mg BID x 7 days then 5 mg BID thereafter. Patient was d/c home 07/19/23. She re-presented to outside hospital for worsening chest pain and CT demonstrated increased size of stomach containing diaphragmatic hernia with air fluid levels. Transferred to [facility] for further management.     BRIEF HOSPITAL NARRATIVE:     Patient was admitted to [facility] on 07/24/23 with NG tube in place for decompression. Eliquis  was transitioned to heparin  gtt for surgical intervention. Patient was taken to the OR 07/29/23 for flexible EGD, lysis of adhesions, robotic assisted repair of left posterior diaphragmatic hernia, gastropexy, and PEG tube placement. Patient transferred to CVICU postoperatively and was able to transfer to SDU next day. Abdominal JP removed 6/25. CT removed 6/26. Postoperative course complicated by urinary retention requiring straight cath  x2. Patient found to have UTI with cultures growing E Coli. Urology consulted in setting of recent left  nephrectomy and they recommended 14 day course of Nitrofurantoin  (final dose 7/10) and out patient follow up with them. Heme oncology consulted for [facility] recommendations in the setting of patient cocurrent use of phenytoin . Heme/oncology recommending therapeutic Lovenox. Will follow up with them for ongoing management of PE as well as renal carcinoma. Patient's PEG tube can be vented PRN. Patient being sent home on a SOFT DIET for 6 weeks. See medication changes above.     TRANSITION/POST DISCHARGE CARE/PENDING TESTS/REFERRALS:     Left diaphragmatic hernia s/p robotic-assisted diaphragm hernia repair, gastropexy, and PEG placement on 6/20   -Okay for rehab facility to remove staples postoperative day 14 - 07/09/23  -Continue to clamp PEG tube  -Vent PEG tube q6hr or PRN  -Continue SOFT DIET for 6 weeks     Complicated urinary tract infection  History of L nephrectomy for renal cell carcinoma   -Continue Macrobid  (end date 6/10)  -Follow up Urology appointment after ABX course completed   -Monitor I/O  -Follow up heme oncology     Acute pulmonary emboli   -Start taking therapeutic Lovenox BID  -Follow up heme/oncology    HCM:  -Refer to [facility] family medicine to establish care with PCP  -Continue home medications. Changes list above.     CONDITION ON DISCHARGE:  A. Ambulation: Full ambulation  B. Self-care Ability: With full assistance  C. Cognitive Status Alert and Oriented x 3  D. Code status at discharge:       LINES/DRAINS/WOUNDS AT DISCHARGE:   Patient Lines/Drains/Airways Status       Active Line / Dialysis Catheter / Dialysis Graft / Drain / Airway / Wound       Name Placement date Placement time Site Days    Peripheral IV Right Basilic  (medial side of arm) 07/28/23  1300  -- 5    Gastrostomy Tube Left 07/26/23  0208  -- 8    Wound  Incision Mid Abdomen 06/19/23  --  -- 45    Wound  Incision Medial;Upper Abdomen  07/25/23  2123  -- 8                    DISCHARGE DISPOSITION:  Skilled Nursing Unit  DISCHARGE INSTRUCTIONS:  Post-Discharge Follow Up Appointments       Follow up with [facility] - Emergency Department    Phone: [phone]    Where: [address], [facility] [place] [address]    Follow up with No pcp    Follow up with Thoracic Surgery, [facility]    Phone: [phone]    Where: [address] [place] [address]    Follow up with Thoracic Oncology, [facility]    Phone: [phone]    Where: [address], [place] [address]    Follow up with Thoracic Surgery, [facility]    Phone: [phone]    Where: [address], [place] [address]      Thursday Aug 07, 2023    Return Telephone Visit with Brodie Gauze, FNP-C at  9:30 AM      Wednesday Aug 13, 2023    Lab with Lab Chair 2, Lab Prn P[facility] [place] at  2:30 PM    Return Patient Visit with Moses Agent, MD at  2:30 PM      Hematology/Oncology, [facility], [place]  [address]  [place] [address]  [phone] Laboratory Services, [facility], [place]  [address]  [place] [address]  [phone] Urology Oncology, [facility]  [facility], [facility]  [address]  [place] [address]  [phone]             XR CHEST PA AND LATERAL    Please schedule CXR same day as follow up appt.    Please schedule chest x-ray for before follow up in clinic with Dr. Fredonia     Reason for Exam: s/p diaphragmatic hernia repair    Preferred location: [facility]      Refer to [facility]   Referral Type: Physician Referral-Office Visits   Number of Visits Requested: 1     DISCHARGE INSTRUCTION - ACTIVITY    We suggest a cautious, yet progressive plan to safely regain your normal body and life functions. In time,  you should be able to do your regular routine tasks, return to work, and take part in recreational activities. No lifting over 10 lbs for the first 4 weeks.  Here are a few tips:  Get up and get dressed each morning. Please do not stay in bed, wear comfortable clothes each day, break up long tasks into shorter parts, and space them over the day, stop your tasks before you get tired, if you do too much, you will likely be tired the next day and need to rest, which will slow recovery, balance your activity with rest times. Your body may give you signals to rest, these include symptoms such as shortness of breath, fatigue, dizziness, and pain, rest when possible or needed. If you need to climb stairs, we suggest going slowly at first. Always remember that it takes more energy to climb a flight of steps then to walk on a level surface. If you start to become tired or have shortness   of breath, stop, rest for a few minutes, and then continue. You should only use a railing for your balance, please do not pull yourself up the stairs as the strain can damage your incision and put excess pressure on your body.     Activity: NO LIFTING OVER 10 POUNDS FOR 4 WEEKS      DISCHARGE INSTRUCTION - DRIVING    You will not be cleared to safely drive a car until after your visit with your surgeon, this visit is often scheduled two-four weeks after your surgery. No driving while taking Narcotic (pain) medications.  If you were to be involved in a car accident, you would hurt your surgical site or other areas that have undergone surgical procedures. When you are a passenger in a car, we suggest you ride in the back seat, if possible. If you are riding in the front seat, move as far back as possible to increase leg room, and use a pillow between your chest and the seat belt for added comfort and to avoid any irritation on your incision. We advise against taking long trips without a doctors approval, and when approved be sure to allow  ample time for stops to walk and stretch your legs and arms.     DISCHARGE INSTRUCTION - INCISION/WOUND CARE    Please follow these steps to care for your incision(s): breathe in through your nose as you raise your arms during activity, breathe out through your mouth as you lower your arms. Never hold your breath, you may raise your arms over your head  to brush or shampoo your hair. Be careful when reaching. The incision and the muscles around it may be very sore,  do not lift anything heavier than 10 pounds for 4 weeks after surgery,  you should not push or pull with your arms, especially when rising from a bed, assistive devices, such as canes or walkers, can be used only for balance. Do not place your full weight on any of these devices until the incision is fully healed, it is very important to keep your incisions clean and dry. Follow these guidelines: shower daily, pat dry. Do not take a tub bath for 4 weeks or until your surgeon says you may. Wash your incisions with an antimicrobial soap and water , pat dry. Always use a clean cloth. Do not put any creams, lotions, or antibiotic   ointments on the incisions. Keep your legs raised when sitting for more than 15 minutes, and remember to stretch. Do not wear any tight clothing that may rub against your incisions, causing irritation.     Instructions for incision/wound care: Keep Incision Clean and Dry      DISCHARGE INSTRUCTION - RESPIRATORY CARE    You should continue to use the incentive spirometer tool provided to you by the hospital at least four times a day (10 repetitions eachtime for one week). It is important to keep your airways free of mucus buildup at all times.     DISCHARGE INSTRUCTION - WHEN TO CONTACT THE SURGEON'S OFFICE AT [phone]    - Increased warmth in the skin around an incision  - Redness that spreads out more than one inch from incision edges  - Increase of swelling, tightness, or pain around an incision  - Large amount of clear or  pinkish drainage  - Sudden increase in the amount of drainage  - White, yellow, or green drainage with odor coming from an incision  - Fever higher than 101 F (38.3 C)  - Chills or temperature of 99 F (37.2 C) to 100.9 F (38.27 C) for more than 3 days.  - Continued or severe sadness  - Lightheadedness/ dizziness  - Increased shortness of breath  - Burning when passing urine  - Severe calf pain  - New severe pain in your chest  - Heart rate greater than 110 beats per minute or less than 50 beats per minute    - You are also encouraged to call the surgeons office at any time, in the event you have questions and/or concerns during your recovery process.     DISCHARGE INSTRUCTION - MISC    Please remove all abdominal and chest staples on 08/08/23. If any questions please call [phone].     DISCHARGE INSTRUCTION - DIET    Please stay on a soft diet for 6 weeks. We will discuss advancing your diet at your follow up appointment.     Diet: SOFT      FOLLOW-UP: CANCER CENTER PROVIDER - [facility] - [place], [place] z- Other (See Comments); Hospital doctor; What type of follow up should be scheduled? In person     PROVIDER z- Other (See Comments)    Other: Elizabeth [name]    Is this a NEW or RETURN visit? RETURN    Follow-up in: 1 WEEK    What type of follow up should be scheduled? In person      FOLLOW-UP: CANCER CENTER PROVIDER - [facility] - , [place] z- Other (See Comments); Uro Onc -  Elizabeth [name]; What type of follow up should be scheduled? In person     PROVIDER z- Other (See Comments)    Other: Uro Onc - Elizabeth [name]    Is this a NEW or RETURN visit? RETURN    Follow-up in: 1 WEEK    What type of follow up should be scheduled? In person      FOLLOW-UP: [facility] - THORACIC SURGERY - [place], [place]    Please schedule follow up for after chest x-ray   Schedule follow up as a telephone visit     Reason for visit: POST-OP VISIT    Follow-up in: 6 WEEKS    Coordination with other outpt. appts.? YES (provide details in  comment section & place separate orders for those items) please schedule follow up for after CXR   Provider: Voppuru           Rankin Gosling, PA-C    Copies sent to Care Team         Relationship Specialty Notifications Start End    Pcp, No PCP - General   07/25/23             Referring providers can utilize [email or web address] to access their referred [facility] patient's information.                               [1]   Allergies  Allergen Reactions    Ace Inhibitors      Other reaction(s): Angioedema

## 2023-08-01 NOTE — Anesthesia Procedure Notes (Signed)
 [facility] Placement    Performed By  Performing provider: Lisette Saba, DO  Authorizing provider: Tilford Garre, DO    Patient Location  Pt location: In OR  Consent  written from patient.  Protocol   Patient identity confirmed with anonymous protocol, patient vented/unresponsive.  Preprocedure hand washing was performed; sterile field was maintained   Time out performed: was performed to confirm correct patient and procedure.  Ultrasound  Ultrasound was used to identify the vessel. It was assessed and patent. Ultrasound was used to visualize vascular needle entry into the vessel. Ultrasound used for needle placement, imaging, supervision, and interpretation      Procedure   A  Cordis and Double lumen central line was inserted into the right Internal Jugular, for  central pressure monitoring, multiple IV medications or infusions and vascular access. Patient was placed in   .  Technique Ultrasound guided and Catheter thru needle used guidewire used and guidewire removed    Number of attempts :1.Site was prepped with chlorhexidine  gluconate and isopropyl alcohol, patient was anesthetized, and site local  was used. Line secured by Transparent dressing and line sutured at  cms. Total catheter length of   successful procedure            Post Placement  blood return through all ports, No pulsation, Dark color and Lumen(s) flush without resistance  Complications(if any).  none  Comments

## 2023-08-01 NOTE — Progress Notes (Signed)
 [facility]  Thoracic Surgery   Progress Note      Elizabeth [name], Elizabeth [name]  Date of Admission:  07/24/2023  Date of Birth:  Feb 12, 1970  Hospital Day:  LOS: 8 days   Date of Service:  08/01/2023    Subjective   No acute events overnight. Patient is ready for discharge.     Objective    Current Diet:    MNT PROTOCOL FOR DIETITIAN  DIET REGULAR Consistency/thickening: Solid: LEVEL 7: Easy to Chew    Current Medications:  acetaminophen  (TYLENOL ) tablet, 650 mg, Oral, Q4H PRN  apixaban  (ELIQUIS ) tablet, 5 mg, Oral, 2x/day  atorvastatin  (LIPITOR) tablet, 20 mg, Oral, QPM  Correction/SSIP insulin  lispro 100 units/mL injection, 2-9 Units, Subcutaneous, 4x/day AC  D5W 250 mL flush bag, , Intravenous, Q15 Min PRN  dextrose  (GLUTOSE) 40% oral gel, 15 g, Oral, Q15 Min PRN  dextrose  50% (0.5 g/mL) injection - syringe, 12.5 g, Intravenous, Q15 Min PRN  [START ON 08/03/2023] ergocalciferol -vitamin D2 (DRISDOL ) 8,000 units per mL oral drops, 50,000 Units, Oral, Q7 Days  escitalopram  (LEXAPRO ) tablet, 20 mg, Oral, Daily  famotidine  (PEPCID ) tablet, 20 mg, Oral, Daily  glucagon  injection 1 mg, 1 mg, IntraMUSCULAR, Once PRN  hydrALAZINE  (APRESOLINE ) injection 10 mg, 10 mg, Intravenous, Q6H PRN  levothyroxine  (SYNTHROID ) tablet, 25 mcg, Oral, QAM  losartan  (COZAAR ) tablet, 100 mg, Oral, Daily  nitrofurantoin  monohydrate macrocrystal (MACROBID ) capsule, 100 mg, Oral, 2x/day-Food  NS 250 mL flush bag, , Intravenous, Q15 Min PRN  NS bolus infusion 40 mL, 40 mL, Intravenous, Once PRN  NS flush syringe, 2-6 mL, Intracatheter, Q8HRS  NS flush syringe, 2-6 mL, Intracatheter, Q1 MIN PRN  oxyCODONE  (ROXICODONE ) immediate release tablet, 5 mg, Oral, Q4H PRN  phenytoin  sodium extended release (DILANTIN ) capsule, 300 mg, Oral, 2x/day  sennosides-docusate sodium  (SENOKOT-S) 8.6-50mg  per tablet, 1 Tablet, Oral, 2x/day  tamsulosin  (FLOMAX ) capsule, 0.4 mg, Oral, Daily after Dinner      Vital Signs:  Temp (24hrs) Max:37.1 C (98.8 F)    Temperature:  37 C (98.6 F)  BP (Non-Invasive): (!) 133/92  MAP (Non-Invasive): 99 mmHG  Heart Rate: 78  Respiratory Rate: 18  SpO2: 98 %    Base (Admission) Weight:  Weight: 83.2 kg (183 lb 6.8 oz)  Weight:  Weight: 82.5 kg (181 lb 14.1 oz)    Physical Exam:  General: no acute distress, appears stated age  Head: normocephalic, atraumatic  Eyes anicteric, extraocular movements are intact  Neck: trachea midline, no masses, no JVD  Chest: clear bilaterally, normal excursion   Heart: RRR  Abdomen: soft, non-distended, no masses or hernia   Extremeties: no edema, cyanosis  Skin: normal turgor, non-icteric, no obvious rashes           Surgical wounds clean dry and intact  Neuro: no focal neurological deficit, no tremor  Psych: normal affect and judgement    Labs:  I have reviewed all pertinent results as below:  BMP (Last 24 Hours):    Recent Results last 24 hours     07/31/23  1313 08/01/23  0529   SODIUM  --  140   POTASSIUM  --  4.0   CHLORIDE  --  108   CO2  --  24   BUN  --  7*   CREATININE 0.69 0.59*   CALCIUM   --  8.9     Radiology:  (images and reports personally reviewed):      AM Chest x-ray 07/30/23:  FINDINGS:  Lines, tubes and hardware: Stable positioning of left-sided chest tube.     Lungs and pleura: Left basilar opacity likely reflects component of atelectasis and pleural effusion. The right lung is clear. No pneumothorax     Heart and mediastinum: The heart size is normal for technique. The mediastinal contours are normal.       Bones: No acute bony abnormality is identified.     IMPRESSION:  Small left basilar pleural effusion with adjacent atelectasis/pneumonia.    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 OR 6/20 for robotic-assisted diaphragm hernia repair, gastropexy, and PEG placement. Patient found to have UTI 6/25. Urology consulted in  setting of recent nephrectomy. Started on Nitrofurantoin  for 14 day course. Will follow up in urology clinic in 7-10 days.     PLAN:  Left diaphragmatic hernia s/p robotic-assisted diaphragm hernia repair, gastropexy, and PEG placement on 6/20                -Advance to soft diet  -Continue soft diet for 6 weeks (8/5)    -Consult nutrition for dietary education                -Continue to clamp PEG tube                              -Vent tube q6hr or as needed                -Abdominal JP removed 6/25                              -Abdominal fluid amylase negative                -Left chest tube removed 6/26                -AM labs and CXR    Complicated urinary tract infection  Urinary retention    -Urology consulted in setting of recent left nephrectomy with partial ureterectomy, adrenalectomy 06/19/23    -Recommend 14 day course of Nitrofurantoin  (Final dose 7/10)   -Straight cath required 6/23, 6/25    -Patient able to void on own AM of 6/26 & 6/27 , will hold off on foley for now   -Post void residuals Q void   -Strict I/O q hour      Acute pulmonary emboli   -CTA 07/16/23 showed acute pulmonary emboli in segmental and subsegmental branches   -Heparin  gtt d/c 6/26  -Restart Eliquis  5 mg BID 6/26   -Patient completed loading dose after last admission    - Per pharmacy, Eliquis  has DDI w/ phenytoin . Recommending Coumadin.    - Hematology-oncology consult, appreciate assistance      Obstructive Sleep Apnea                 -Restart home CPAP                              -Patient's G-tube to MUST BE to gravity bag QHS while patient is using CPAP    Chronic Medical Conditions:  Appropriate Home Meds restarted:  Yes     Gastroesophageal Reflux Disease                 -  Continue home Pepcid      Hypertension  Hyperlipidemia                 -Holding home Losartan                 -Hydralazine  10mg   Q6h PRN   -Continue home Lipitor      Type II Diabetes Mellitus                -Holding home Ozempic                  -SSI       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  Seizure Disorder  Mood Disorder                -Continue home Dilantin                  -Continue home Lexapro      Hypothyroidism                 -Continue home Synthroid     DVT/PE prophylaxis: Continue home Eliquis    Bowel regimen/last BM: Senokot  / Date of Last Bowel Movement: 07/31/23  Therapy:  PT/OT   Disposition Planning: Skilled Nursing Unit - Patient is d/c ready     I independently of the faculty provider spent a total of (20) minutes in direct/indirect care of this patient including initial evaluation, review of laboratory, radiology, diagnostic studies, review of medical record, order entry and coordination of care.    Lauraine Donning, PA-C

## 2023-08-01 NOTE — Addendum Note (Signed)
 Addendum  created 08/01/23 0933 by Lisette Saba, DO    Child order released for a procedure order, Clinical Note Signed, Intraprocedure Blocks edited, LDA created via procedure documentation, SmartForm saved

## 2023-08-01 NOTE — Nurses Notes (Deleted)
 0330-Call from telemetry that patient in Afib. Paged service, amio bolus ordered.     9549- patient still in Afib, paged service. Amio bolus ordered. Orders to keep NPO

## 2023-08-02 ENCOUNTER — Encounter (HOSPITAL_COMMUNITY): Payer: Self-pay | Admitting: Hospitalist

## 2023-08-02 DIAGNOSIS — E039 Hypothyroidism, unspecified: Secondary | ICD-10-CM | POA: Insufficient documentation

## 2023-08-02 DIAGNOSIS — N390 Urinary tract infection, site not specified: Secondary | ICD-10-CM | POA: Diagnosis not present

## 2023-08-02 DIAGNOSIS — E119 Type 2 diabetes mellitus without complications: Secondary | ICD-10-CM | POA: Insufficient documentation

## 2023-08-02 DIAGNOSIS — K219 Gastro-esophageal reflux disease without esophagitis: Secondary | ICD-10-CM

## 2023-08-02 DIAGNOSIS — I639 Cerebral infarction, unspecified: Secondary | ICD-10-CM | POA: Insufficient documentation

## 2023-08-02 HISTORY — DX: Gastro-esophageal reflux disease without esophagitis: K21.9

## 2023-08-02 LAB — POC BLOOD GLUCOSE (RESULTS)
GLUCOSE, POC: 96 mg/dL (ref 65–125)
GLUCOSE, POC: 101 mg/dL (ref 65–125)
GLUCOSE, POC: 143 mg/dL — ABNORMAL HIGH (ref 65–125)
GLUCOSE, POC: 110 mg/dL (ref 65–125)

## 2023-08-02 MED ORDER — NITROFURANTOIN MONOHYDRATE/MACROCRYSTALS 100 MG CAPSULE
100.0000 mg | ORAL_CAPSULE | Freq: Two times a day (BID) | ORAL | Status: AC
Start: 2023-08-02 — End: 2023-08-14

## 2023-08-02 NOTE — Care Plan (Signed)
 NAEO. Pain managed with PRN medications. Staples remain to abdomen. Plan for d/c to [facility] today. VS and assessments per flowsheet. High fall precautions maintained, sitter select on and audible. Call bell and personal belongings in reach.    Problem: Adult Inpatient Plan of Care  Goal: Plan of Care Review  Outcome: Ongoing (see interventions/notes)  Goal: Patient-Specific Goal (Individualized)  Outcome: Ongoing (see interventions/notes)  Flowsheets (Taken 08/01/2023 2025)  Individualized Care Needs: needs extra time to allow for verbal responses  Anxieties, Fears or Concerns: none stated  Goal: Absence of Hospital-Acquired Illness or Injury  Outcome: Ongoing (see interventions/notes)  Intervention: Identify and Manage Fall Risk  Recent Flowsheet Documentation  Taken 08/01/2023 2025 by Elida DEL, RN  Safety Promotion/Fall Prevention:   activity supervised   fall prevention program maintained   motion sensor pad activated   nonskid shoes/slippers when out of bed   safety round/check completed  Intervention: Prevent Skin Injury  Recent Flowsheet Documentation  Taken 08/02/2023 0200 by Elida DEL, RN  Body Position: side lying, left  Taken 08/01/2023 2025 by Elida DEL, RN  Body Position: supine, head elevated  Skin Protection:   adhesive use limited   incontinence pads utilized   tubing/devices free from skin contact   transparent dressing maintained  Intervention: Prevent and Manage VTE (Venous Thromboembolism) Risk  Recent Flowsheet Documentation  Taken 08/01/2023 2025 by Elida DEL, RN  VTE Prevention/Management:   ambulation promoted   anticoagulant therapy maintained   dorsiflexion/plantar flexion performed  Intervention: Prevent Infection  Recent Flowsheet Documentation  Taken 08/01/2023 2025 by Elida DEL, RN  Infection Prevention:   barrier precautions utilized   promote handwashing   rest/sleep promoted   single patient room provided  Goal: Optimal Comfort and Wellbeing  Outcome: Ongoing (see  interventions/notes)  Intervention: Provide Person-Centered Care  Recent Flowsheet Documentation  Taken 08/01/2023 2025 by Elida DEL, RN  Trust Relationship/Rapport:   care explained   choices provided   questions answered   questions encouraged   thoughts/feelings acknowledged  Goal: Rounds/Family Conference  Outcome: Ongoing (see interventions/notes)     Problem: Wound  Goal: Optimal Coping  Outcome: Ongoing (see interventions/notes)  Intervention: Support Patient and Family Response  Recent Flowsheet Documentation  Taken 08/01/2023 2025 by Elida DEL, RN  Family/Support System Care:   support provided   self-care encouraged  Goal: Optimal Functional Ability  Outcome: Ongoing (see interventions/notes)  Intervention: Optimize Functional Ability  Recent Flowsheet Documentation  Taken 08/01/2023 2025 by Elida DEL, RN  Activity Management: ROM, active encouraged  Activity Assistance Provided: assistance, 1 person  Assistive Device Utilized: walker  Goal: Absence of Infection Signs and Symptoms  Outcome: Ongoing (see interventions/notes)  Intervention: Prevent or Manage Infection  Recent Flowsheet Documentation  Taken 08/01/2023 2025 by Elida DEL, RN  Fever Reduction/Comfort Measures:   lightweight bedding   lightweight clothing  Goal: Improved Oral Intake  Outcome: Ongoing (see interventions/notes)  Goal: Optimal Pain Control and Function  Outcome: Ongoing (see interventions/notes)  Intervention: Prevent or Manage Pain  Recent Flowsheet Documentation  Taken 08/01/2023 2025 by Elida DEL, RN  Sleep/Rest Enhancement:   awakenings minimized   regular sleep/rest pattern promoted  Goal: Skin Health and Integrity  Outcome: Ongoing (see interventions/notes)  Intervention: Optimize Skin Protection  Recent Flowsheet Documentation  Taken 08/01/2023 2025 by Elida DEL, RN  Pressure Reduction Techniques: Frequent weight shifting encouraged  Pressure Reduction Devices:   Repositioning wedges/pillows utilized   Sacral dressing utilized if  not incontinent of bowel  Activity Management: ROM, active encouraged  Head of Bed (HOB) Positioning: HOB at 45 degrees  Goal: Optimal Wound Healing  Outcome: Ongoing (see interventions/notes)  Intervention: Promote Wound Healing  Recent Flowsheet Documentation  Taken 08/01/2023 2025 by Elida DEL, RN  Pressure Reduction Techniques: Frequent weight shifting encouraged  Pressure Reduction Devices:   Repositioning wedges/pillows utilized   Sacral dressing utilized if not incontinent of bowel  Sleep/Rest Enhancement:   awakenings minimized   regular sleep/rest pattern promoted  Activity Management: ROM, active encouraged     Problem: Health Knowledge, Opportunity to Enhance (Adult,Obstetrics,Pediatric)  Goal: Knowledgeable about Health Subject/Topic  Description: Patient will demonstrate the desired outcomes by discharge/transition of care.  Outcome: Ongoing (see interventions/notes)  Intervention: Enhance Health Knowledge  Recent Flowsheet Documentation  Taken 08/01/2023 2025 by Elida DEL, RN  Family/Support System Care:   support provided   self-care encouraged

## 2023-08-02 NOTE — Care Management Notes (Signed)
 MSW following for possible d/c to [facility].  After review of chart and speaking with Etta from [employer], found auth had been cancelled for [place] location as transportation could not be arranged.  CM Assistant reported, EMS arranged for 10:30 to [facility].  MSW confirmed with Merlynn from [place] that shara was cancelled.  EMS cancelled as pt does not meet medical necessity for transport.  MSW met with pt at bedside to discuss.  Pt aware friends or family would need to provide transport if she is hoping to d/c to [facility].  Pt requested to speak with her sister before deciding on location.  Etta reported that shara will need to be obtained for [facility] if pt decides on this location. Care Management will continue to follow.

## 2023-08-02 NOTE — Progress Notes (Signed)
 [facility]  Thoracic Surgery   Progress Note      Elizabeth, [name]  Date of Admission:  07/24/2023  Date of Birth:  April 01, 1970  Hospital Day:  LOS: 9 days   Date of Service:  08/02/2023    Subjective   No acute events overnight. Patient is ready for discharge.     Objective    Current Diet:    MNT PROTOCOL FOR DIETITIAN  DIET REGULAR Consistency/thickening: Solid: LEVEL 7: Easy to Chew    Current Medications:  acetaminophen  (TYLENOL ) tablet, 650 mg, Oral, Q4H PRN  atorvastatin  (LIPITOR) tablet, 20 mg, Oral, QPM  Correction/SSIP insulin  lispro 100 units/mL injection, 2-9 Units, Subcutaneous, 4x/day AC  D5W 250 mL flush bag, , Intravenous, Q15 Min PRN  dextrose  (GLUTOSE) 40% oral gel, 15 g, Oral, Q15 Min PRN  dextrose  50% (0.5 g/mL) injection - syringe, 12.5 g, Intravenous, Q15 Min PRN  enoxaparin PF (LOVENOX) 80 mg/0.8 mL SubQ injection, 1 mg/kg, Subcutaneous, 2X/day  [START ON 08/03/2023] ergocalciferol -vitamin D2 (DRISDOL ) 8,000 units per mL oral drops, 50,000 Units, Oral, Q7 Days  escitalopram  (LEXAPRO ) tablet, 20 mg, Oral, Daily  famotidine  (PEPCID ) tablet, 20 mg, Oral, Daily  glucagon  injection 1 mg, 1 mg, IntraMUSCULAR, Once PRN  hydrALAZINE  (APRESOLINE ) injection 10 mg, 10 mg, Intravenous, Q6H PRN  levothyroxine  (SYNTHROID ) tablet, 25 mcg, Oral, QAM  losartan  (COZAAR ) tablet, 100 mg, Oral, Daily  methocarbamol  (ROBAXIN ) tablet, 500 mg, Oral, 4x/day PRN  nitrofurantoin  monohydrate macrocrystal (MACROBID ) capsule, 100 mg, Oral, 2x/day-Food  NS 250 mL flush bag, , Intravenous, Q15 Min PRN  NS bolus infusion 40 mL, 40 mL, Intravenous, Once PRN  NS flush syringe, 2-6 mL, Intracatheter, Q8HRS  NS flush syringe, 2-6 mL, Intracatheter, Q1 MIN PRN  oxyCODONE  (ROXICODONE ) immediate release tablet, 5 mg, Oral, Q4H PRN  phenytoin  sodium extended release (DILANTIN ) capsule, 300 mg, Oral, 2x/day  sennosides-docusate sodium  (SENOKOT-S) 8.6-50mg  per tablet, 1 Tablet, Oral, 2x/day  tamsulosin  (FLOMAX ) capsule, 0.4 mg,  Oral, Daily after Dinner      Vital Signs:  Temp (24hrs) Max:37.4 C (99.3 F)    Temperature: 37.3 C (99.2 F)  BP (Non-Invasive): 134/72  MAP (Non-Invasive): 88 mmHG  Heart Rate: 81  Respiratory Rate: 16  SpO2: 96 %    Base (Admission) Weight:  Weight: 83.2 kg (183 lb 6.8 oz)  Weight:  Weight: 80.7 kg (177 lb 14.6 oz)    Physical Exam:  General: no acute distress, appears stated age  Head: normocephalic, atraumatic  Eyes anicteric, extraocular movements are intact  Neck: trachea midline, no masses, no JVD  Chest: clear bilaterally, normal excursion   Heart: RRR  Abdomen: soft, non-distended, no masses or hernia   Extremeties: no edema, cyanosis  Skin: normal turgor, non-icteric, no obvious rashes           Surgical wounds clean dry and intact  Neuro: no focal neurological deficit, no tremor  Psych: normal affect and judgement    Labs:  I have reviewed all pertinent results as below:  BMP (Last 24 Hours):    No results for input(s): SODIUM, POTASSIUM, CHLORIDE, CO2, BUN, CREATININE, CALCIUM , GLUCOSENF in the last 24 hours.    Radiology:  (images and reports personally reviewed):      AM Chest x-ray 07/30/23:  FINDINGS:      Lines, tubes and hardware: Stable positioning of left-sided chest tube.     Lungs and pleura: Left basilar opacity likely reflects component of atelectasis and pleural effusion. The right lung is  clear. No pneumothorax     Heart and mediastinum: The heart size is normal for technique. The mediastinal contours are normal.       Bones: No acute bony abnormality is identified.     IMPRESSION:  Small left basilar pleural effusion with adjacent atelectasis/pneumonia.    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 OR 6/20 for robotic-assisted diaphragm hernia repair, gastropexy, and PEG placement. Patient found to  have UTI 6/25. Urology consulted in setting of recent nephrectomy. Started on Nitrofurantoin  for 14 day course. Will follow up in urology clinic in 7-10 days.     PLAN:  Left diaphragmatic hernia s/p robotic-assisted diaphragm hernia repair, gastropexy, and PEG placement on 6/20                -Advance to soft diet  -Continue soft diet for 6 weeks (8/5)    -Consult nutrition for dietary education                -Continue to clamp PEG tube                              -Vent tube q6hr or as needed                -Abdominal JP removed 6/25                              -Abdominal fluid amylase negative                -Left chest tube removed 6/26                -AM labs and CXR    Complicated urinary tract infection  Urinary retention    -Urology consulted in setting of recent left nephrectomy with partial ureterectomy, adrenalectomy 06/19/23    -Recommend 14 day course of Nitrofurantoin  (Final dose 7/10)   -Straight cath required 6/23, 6/25    -Patient able to void on own AM of 6/26 & 6/27 , will hold off on foley for now   -Post void residuals Q void   -Strict I/O q hour      Acute pulmonary emboli   -CTA 07/16/23 showed acute pulmonary emboli in segmental and subsegmental branches    -Cardiac workup with no right heart strain  -Heparin  gtt d/c 6/26  -Hematology-oncology consult for [facility] recommendations, appreciate assistance    -Recommending d/c Eliquis  and starting therapeutic Lovenox BID   -F/u with heme oncology OP    Chronic Medical Conditions:  Appropriate Home Meds restarted:  Yes    Obstructive Sleep Apnea                 -Continue home CPAP     Gastroesophageal Reflux Disease                 -Continue home Pepcid      Hypertension  Hyperlipidemia                 -Holding home Losartan                 -Hydralazine  10mg   Q6h PRN   -Continue home Lipitor      Type II Diabetes Mellitus                -  Holding home Ozempic                  -SSI      Remote Astrocytoma Brain Cancer s/p Resection w/ Adjuvant  Chemoradiation  Seizure Disorder  Mood Disorder                -Continue home Dilantin                  -Continue home Lexapro      Hypothyroidism                 -Continue home Synthroid     DVT/PE prophylaxis: Continue home Eliquis    Bowel regimen/last BM: Senokot / Date of Last Bowel Movement: 08/02/23  Therapy:  PT/OT   Disposition Planning: Skilled Nursing Unit - Patient is d/c ready     I independently of the faculty provider spent a total of (20) minutes in direct/indirect care of this patient including initial evaluation, review of laboratory, radiology, diagnostic studies, review of medical record, order entry and coordination of care.    Rankin Gosling, PA-C    Thoracic Surgery Attending  I agree with the Service note written by the PA/resident and this should serve as an addendum to that note.   I have reviewed patient's history and examined the patient .  53 yo female s/p robotic-assisted diaphragm hernia repair, gastropexy, and PEG placement. DC planning.    J.W. Leim Goldmann, MD MPH MHL  FACS FRCS [facility]   Professor in Cardiovascular and Thoracic Surgery

## 2023-08-02 NOTE — Care Plan (Signed)
 VS and assessment per flowsheets. Reporting some discomfort at incisional sites but no c/o pain throughout shift. PEG remained clamped throughout shift. Plan to D/C to [facility] once facility and transport are coordinated. Pt high fall precautions with sitter select on and audible. Seizure precautions maintained with side rails padded. Call bell and personal belongings within reach.     Problem: Adult Inpatient Plan of Care  Goal: Plan of Care Review  Outcome: Ongoing (see interventions/notes)  Goal: Patient-Specific Goal (Individualized)  Outcome: Ongoing (see interventions/notes)  Goal: Absence of Hospital-Acquired Illness or Injury  Outcome: Ongoing (see interventions/notes)  Intervention: Identify and Manage Fall Risk  Recent Flowsheet Documentation  Taken 08/02/2023 0815 by Logan HERO, RN  Safety Promotion/Fall Prevention:   activity supervised   fall prevention program maintained   nonskid shoes/slippers when out of bed   safety round/check completed  Intervention: Prevent Skin Injury  Recent Flowsheet Documentation  Taken 08/02/2023 0820 by Logan HERO, RN  Body Position: sitting  Taken 08/02/2023 0815 by Logan HERO, RN  Body Position: supine, head elevated  Skin Protection:   adhesive use limited   incontinence pads utilized   preventative decubiti skin protection foam dressing applied/intact   skin-to-device areas padded   skin-to-skin areas padded   transparent dressing maintained   tubing/devices free from skin contact  Intervention: Prevent and Manage VTE (Venous Thromboembolism) Risk  Recent Flowsheet Documentation  Taken 08/02/2023 1650 by Logan HERO, RN  VTE Prevention/Management:   ambulation promoted   anticoagulant therapy maintained   dorsiflexion/plantar flexion performed  Taken 08/02/2023 1255 by Logan HERO, RN  VTE Prevention/Management:   ambulation promoted   anticoagulant therapy maintained   dorsiflexion/plantar flexion performed  Taken 08/02/2023 0815 by Logan HERO, RN  VTE Prevention/Management:   ambulation  promoted   anticoagulant therapy maintained   dorsiflexion/plantar flexion performed  Intervention: Prevent Infection  Recent Flowsheet Documentation  Taken 08/02/2023 0815 by Logan HERO, RN  Infection Prevention:   barrier precautions utilized   personal protective equipment utilized   promote handwashing   rest/sleep promoted   single patient room provided  Goal: Optimal Comfort and Wellbeing  Outcome: Ongoing (see interventions/notes)  Intervention: Provide Person-Centered Care  Recent Flowsheet Documentation  Taken 08/02/2023 0815 by Logan HERO, RN  Trust Relationship/Rapport:   choices provided   care explained   emotional support provided   empathic listening provided   questions answered   questions encouraged   reassurance provided   thoughts/feelings acknowledged  Goal: Rounds/Family Conference  Outcome: Ongoing (see interventions/notes)     Problem: Wound  Goal: Optimal Coping  Outcome: Ongoing (see interventions/notes)  Intervention: Support Patient and Family Response  Recent Flowsheet Documentation  Taken 08/02/2023 0815 by Logan HERO, RN  Supportive Measures:   active listening utilized   goal-setting facilitated   positive reinforcement provided   self-reflection promoted  Goal: Optimal Functional Ability  Outcome: Ongoing (see interventions/notes)  Intervention: Optimize Functional Ability  Recent Flowsheet Documentation  Taken 08/02/2023 0820 by Logan HERO, RN  Activity Management:   ambulated in room   up in chair  Taken 08/02/2023 0815 by Logan HERO, RN  Activity Management: ROM, active encouraged  Activity Assistance Provided: assistance, stand-by  Assistive Device Utilized: walker  Goal: Absence of Infection Signs and Symptoms  Outcome: Ongoing (see interventions/notes)  Intervention: Prevent or Manage Infection  Recent Flowsheet Documentation  Taken 08/02/2023 0815 by Logan HERO, RN  Fever Reduction/Comfort Measures:   lightweight bedding   lightweight clothing  Goal: Improved  Oral Intake  Outcome: Ongoing (see  interventions/notes)  Goal: Optimal Pain Control and Function  Outcome: Ongoing (see interventions/notes)  Goal: Skin Health and Integrity  Outcome: Ongoing (see interventions/notes)  Intervention: Optimize Skin Protection  Recent Flowsheet Documentation  Taken 08/02/2023 0820 by Logan HERO, RN  Activity Management:   ambulated in room   up in chair  Taken 08/02/2023 0815 by Logan HERO, RN  Pressure Reduction Techniques:   Mobility is maximized   Moisture, shear and nutrition are maximized   30 degree lateral position utilized   Supplemented with small shifts   Frequent weight shifting encouraged   Pressure points protected  Pressure Reduction Devices:   Repositioning wedges/pillows utilized   Sacral dressing utilized if not incontinent of bowel  Activity Management: ROM, active encouraged  Head of Bed (HOB) Positioning: HOB at 20-30 degrees  Goal: Optimal Wound Healing  Outcome: Ongoing (see interventions/notes)  Intervention: Promote Wound Healing  Recent Flowsheet Documentation  Taken 08/02/2023 0820 by Logan HERO, RN  Activity Management:   ambulated in room   up in chair  Taken 08/02/2023 0815 by Logan HERO, RN  Pressure Reduction Techniques:   Mobility is maximized   Moisture, shear and nutrition are maximized   30 degree lateral position utilized   Supplemented with small shifts   Frequent weight shifting encouraged   Pressure points protected  Pressure Reduction Devices:   Repositioning wedges/pillows utilized   Sacral dressing utilized if not incontinent of bowel  Activity Management: ROM, active encouraged     Problem: Health Knowledge, Opportunity to Enhance (Adult,Obstetrics,Pediatric)  Goal: Knowledgeable about Health Subject/Topic  Description: Patient will demonstrate the desired outcomes by discharge/transition of care.  Outcome: Ongoing (see interventions/notes)  Intervention: Enhance Health Knowledge  Recent Flowsheet Documentation  Taken 08/02/2023 0815 by Logan HERO, RN  Supportive Measures:   active listening utilized    goal-setting facilitated   positive reinforcement provided   self-reflection promoted     Problem: Skin Injury Risk Increased  Goal: Skin Health and Integrity  Outcome: Ongoing (see interventions/notes)  Intervention: Optimize Skin Protection  Recent Flowsheet Documentation  Taken 08/02/2023 0820 by Logan HERO, RN  Activity Management:   ambulated in room   up in chair  Taken 08/02/2023 0815 by Logan HERO, RN  Pressure Reduction Techniques:   Mobility is maximized   Moisture, shear and nutrition are maximized   30 degree lateral position utilized   Supplemented with small shifts   Frequent weight shifting encouraged   Pressure points protected  Pressure Reduction Devices:   Repositioning wedges/pillows utilized   Sacral dressing utilized if not incontinent of bowel  Skin Protection:   adhesive use limited   incontinence pads utilized   preventative decubiti skin protection foam dressing applied/intact   skin-to-device areas padded   skin-to-skin areas padded   transparent dressing maintained   tubing/devices free from skin contact  Activity Management: ROM, active encouraged  Head of Bed (HOB) Positioning: HOB at 20-30 degrees     Problem: Fall Injury Risk  Goal: Absence of Fall and Fall-Related Injury  Outcome: Ongoing (see interventions/notes)  Intervention: Identify and Manage Contributors  Recent Flowsheet Documentation  Taken 08/02/2023 0815 by Logan HERO, RN  Self-Care Promotion:   independence encouraged   BADL personal objects within reach   BADL personal routines maintained   meal set-up provided  Intervention: Promote Injury-Free Environment  Recent Flowsheet Documentation  Taken 08/02/2023 0815 by Logan HERO, RN  Safety Promotion/Fall Prevention:   activity supervised  fall prevention program maintained   nonskid shoes/slippers when out of bed   safety round/check completed

## 2023-08-03 ENCOUNTER — Encounter (INDEPENDENT_AMBULATORY_CARE_PROVIDER_SITE_OTHER)
Admit: 2023-08-03 | Discharge: 2023-08-03 | Disposition: A | Discharge status: Home / Self Care | Payer: Self-pay | Attending: Internal Medicine | Admitting: Internal Medicine

## 2023-08-03 LAB — POC BLOOD GLUCOSE (RESULTS)
GLUCOSE, POC: 99 mg/dL (ref 65–125)
GLUCOSE, POC: 105 mg/dL (ref 65–125)
GLUCOSE, POC: 119 mg/dL (ref 65–125)
GLUCOSE, POC: 178 mg/dL — ABNORMAL HIGH (ref 65–125)

## 2023-08-03 NOTE — Nurses Notes (Signed)
 Patient discharged the [facility] via security transport.  Report given to [facility] by day shift RN. AVS reviewed with patient/care giver.  A written copy of the AVS and discharge instructions was given to the patient/care giver.  Questions sufficiently answered as needed.  Patient/care giver encouraged to follow up with PCP as indicated.  In the event of an emergency, patient/care giver instructed to call 911 or go to the nearest emergency room.

## 2023-08-03 NOTE — Progress Notes (Signed)
 [facility]  Thoracic Surgery   Progress Note      Rilley, Poulter  Date of Admission:  07/24/2023  Date of Birth:  23-Apr-1970  Hospital Day:  LOS: 10 days   Date of Service:  08/03/2023    Subjective   No acute events overnight. Patient is ready for discharge.     Objective    Current Diet:    MNT PROTOCOL FOR DIETITIAN  DIET REGULAR Consistency/thickening: Solid: LEVEL 7: Easy to Chew    Current Medications:  acetaminophen  (TYLENOL ) tablet, 650 mg, Oral, Q4H PRN  atorvastatin  (LIPITOR) tablet, 20 mg, Oral, QPM  Correction/SSIP insulin  lispro 100 units/mL injection, 2-9 Units, Subcutaneous, 4x/day AC  D5W 250 mL flush bag, , Intravenous, Q15 Min PRN  dextrose  (GLUTOSE) 40% oral gel, 15 g, Oral, Q15 Min PRN  dextrose  50% (0.5 g/mL) injection - syringe, 12.5 g, Intravenous, Q15 Min PRN  enoxaparin PF (LOVENOX) 80 mg/0.8 mL SubQ injection, 1 mg/kg, Subcutaneous, 2X/day  ergocalciferol -vitamin D2 (DRISDOL ) 8,000 units per mL oral drops, 50,000 Units, Oral, Q7 Days  escitalopram  (LEXAPRO ) tablet, 20 mg, Oral, Daily  famotidine  (PEPCID ) tablet, 20 mg, Oral, Daily  glucagon  injection 1 mg, 1 mg, IntraMUSCULAR, Once PRN  hydrALAZINE  (APRESOLINE ) injection 10 mg, 10 mg, Intravenous, Q6H PRN  levothyroxine  (SYNTHROID ) tablet, 25 mcg, Oral, QAM  losartan  (COZAAR ) tablet, 100 mg, Oral, Daily  methocarbamol  (ROBAXIN ) tablet, 500 mg, Oral, 4x/day PRN  nitrofurantoin  monohydrate macrocrystal (MACROBID ) capsule, 100 mg, Oral, 2x/day-Food  NS 250 mL flush bag, , Intravenous, Q15 Min PRN  NS bolus infusion 40 mL, 40 mL, Intravenous, Once PRN  NS flush syringe, 2-6 mL, Intracatheter, Q8HRS  NS flush syringe, 2-6 mL, Intracatheter, Q1 MIN PRN  oxyCODONE  (ROXICODONE ) immediate release tablet, 5 mg, Oral, Q4H PRN  phenytoin  sodium extended release (DILANTIN ) capsule, 300 mg, Oral, 2x/day  sennosides-docusate sodium  (SENOKOT-S) 8.6-50mg  per tablet, 1 Tablet, Oral, 2x/day  tamsulosin  (FLOMAX ) capsule, 0.4 mg, Oral, Daily after  Dinner      Vital Signs:  Temp (24hrs) Max:37.2 C (99 F)    Temperature: 36.8 C (98.3 F)  BP (Non-Invasive): 124/76  MAP (Non-Invasive): 86 mmHG  Heart Rate: 73  Respiratory Rate: 17  SpO2: 98 %    Base (Admission) Weight:  Weight: 83.2 kg (183 lb 6.8 oz)  Weight:  Weight:  (refused to stand for weight this AM)    Physical Exam:  General: no acute distress, appears stated age  Head: normocephalic, atraumatic  Eyes anicteric, extraocular movements are intact  Neck: trachea midline, no masses, no JVD  Chest: clear bilaterally, normal excursion   Heart: RRR  Abdomen: soft, non-distended, no masses or hernia   Extremeties: no edema, cyanosis  Skin: normal turgor, non-icteric, no obvious rashes           Surgical wounds clean dry and intact  Neuro: no focal neurological deficit, no tremor  Psych: normal affect and judgement    Labs:  I have reviewed all pertinent results as below:  BMP (Last 24 Hours):    No results for input(s): SODIUM, POTASSIUM, CHLORIDE, CO2, BUN, CREATININE, CALCIUM , GLUCOSENF in the last 24 hours.    Radiology:  (images and reports personally reviewed):      AM Chest x-ray 07/30/23:  FINDINGS:      Lines, tubes and hardware: Stable positioning of left-sided chest tube.     Lungs and pleura: Left basilar opacity likely reflects component of atelectasis and pleural effusion. The right lung is clear.  No pneumothorax     Heart and mediastinum: The heart size is normal for technique. The mediastinal contours are normal.       Bones: No acute bony abnormality is identified.     IMPRESSION:  Small left basilar pleural effusion with adjacent atelectasis/pneumonia.    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 OR 6/20 for robotic-assisted diaphragm hernia repair, gastropexy, and PEG placement. Patient found to have UTI  6/25. Urology consulted in setting of recent nephrectomy. Started on Nitrofurantoin  for 14 day course. Will follow up in urology clinic in 7-10 days.     PLAN:  Left diaphragmatic hernia s/p robotic-assisted diaphragm hernia repair, gastropexy, and PEG placement on 6/20                -Advance to soft diet  -Continue soft diet for 6 weeks (8/5)    -Consult nutrition for dietary education                -Continue to clamp PEG tube                              -Vent tube q6hr or as needed                -Abdominal JP removed 6/25                              -Abdominal fluid amylase negative                -Left chest tube removed 6/26                -AM labs and CXR    Complicated urinary tract infection  Urinary retention    -Urology consulted in setting of recent left nephrectomy with partial ureterectomy, adrenalectomy 06/19/23    -Recommend 14 day course of Nitrofurantoin  (Final dose 7/10)   -Straight cath required 6/23, 6/25    -Patient able to void on own AM of 6/26 & 6/27 , will hold off on foley for now   -Post void residuals Q void   -Strict I/O q hour      Acute pulmonary emboli   -CTA 07/16/23 showed acute pulmonary emboli in segmental and subsegmental branches    -Cardiac workup with no right heart strain  -Heparin  gtt d/c 6/26  -Hematology-oncology consult for [facility] recommendations, appreciate assistance    -Recommending d/c Eliquis  and starting therapeutic Lovenox BID   -F/u with heme oncology OP    Chronic Medical Conditions:  Appropriate Home Meds restarted:  Yes    Obstructive Sleep Apnea                 -Continue home CPAP     Gastroesophageal Reflux Disease                 -Continue home Pepcid      Hypertension  Hyperlipidemia                 -Holding home Losartan                 -Hydralazine  10mg   Q6h PRN   -Continue home Lipitor      Type II Diabetes Mellitus                -  Holding home Ozempic                  -SSI      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   Seizure Disorder  Mood Disorder                -Continue home Dilantin                  -Continue home Lexapro      Hypothyroidism                 -Continue home Synthroid     DVT/PE prophylaxis: Continue home Eliquis    Bowel regimen/last BM: Senokot / Date of Last Bowel Movement: 08/02/23  Therapy:  PT/OT   Disposition Planning: Skilled Nursing Unit - Patient is d/c ready     I independently of the faculty provider spent a total of (20) minutes in direct/indirect care of this patient including initial evaluation, review of laboratory, radiology, diagnostic studies, review of medical record, order entry and coordination of care.    Rankin Gosling, PA-C    Thoracic Surgery Attending  I agree with the Service note written by the PA/resident and this should serve as an addendum to that note.   I have reviewed patient's history and examined the patient .  53 yo female s/p robotic-assisted diaphragm hernia repair, gastropexy, and PEG placement. DC planning.     J.W. Leim Goldmann, MD MPH [facility]  FACS FRCS [facility]   Professor in Cardiovascular and Thoracic Surgery

## 2023-08-03 NOTE — Care Management Notes (Signed)
 MSW met with pt at bedside to discuss location for [facility].  Pt confirmed that she would not have transportation to [place] and agreeable for [facility].  Auth obtain and  [facility] has offered a bed.  Pt can transfer at anytime this evening. Security contacted [phone] and will transport pt once contacted by bedside RN  AVS updated for bedside RN to call report.  D/C packet delivered to unit.

## 2023-08-03 NOTE — Nurses Notes (Signed)
 Patient discharged to [facility] with Security transport.  AVS reviewed with patient.  A written copy of the AVS and discharge instructions was given to the patient.  Questions sufficiently answered as needed.  Patient encouraged to follow up with PCP as indicated.  In the event of an emergency, patient instructed to call 911 or go to the nearest emergency room. Report called to [place], RN at [facility].

## 2023-08-04 ENCOUNTER — Other Ambulatory Visit: Attending: Internal Medicine | Admitting: Internal Medicine

## 2023-08-04 ENCOUNTER — Encounter (HOSPITAL_BASED_OUTPATIENT_CLINIC_OR_DEPARTMENT_OTHER): Payer: Self-pay

## 2023-08-04 LAB — CBC/DIFF - CLIENT CONSOLIDATED
BASOPHIL #: 0.12 x10ˆ3/uL (ref <=0.20)
BASOPHIL %: 2 %
EOSINOPHIL #: 0.78 x10ˆ3/uL — ABNORMAL HIGH (ref <=0.50)
EOSINOPHIL %: 13 %
HCT: 32 % — ABNORMAL LOW (ref 34.8–46.0)
HGB: 10.7 g/dL — ABNORMAL LOW (ref 11.5–16.0)
LYMPHOCYTE #: 1.92 x10ˆ3/uL (ref 1.00–4.80)
LYMPHOCYTE %: 31 %
MCH: 31.9 pg (ref 26.0–32.0)
MCHC: 33.4 g/dL (ref 31.0–35.5)
MCV: 95.5 fL (ref 78.0–100.0)
MONOCYTE #: 0.6 x10ˆ3/uL (ref 0.20–1.10)
MONOCYTE %: 10 %
MPV: 9.1 fL (ref 8.7–12.5)
NEUTROPHIL #: 2.58 x10ˆ3/uL (ref 1.50–7.70)
NEUTROPHIL %: 43 %
NRBC FROM MANUAL DIFF: 1 /100{WBCs} (ref <=1)
PLATELETS: 272 x10ˆ3/uL (ref 150–400)
RBC MORPHOLOGY: NORMAL
RBC: 3.35 x10ˆ6/uL — ABNORMAL LOW (ref 3.85–5.22)
RDW-CV: 13.7 % (ref 11.5–15.5)
REACTIVE LYMPHOCYTE %: 1 %
WBC: 6 x10ˆ3/uL (ref 3.7–11.0)

## 2023-08-04 LAB — COMPREHENSIVE METABOLIC PANEL, NON-FASTING
ALBUMIN: 3.3 g/dL — ABNORMAL LOW (ref 3.5–5.0)
ALKALINE PHOSPHATASE: 122 U/L (ref 50–130)
ALT (SGPT): 36 U/L — ABNORMAL HIGH (ref <31)
ANION GAP: 7 mmol/L (ref 4–13)
AST (SGOT): 25 U/L (ref 11–34)
BILIRUBIN TOTAL: 0.2 mg/dL — ABNORMAL LOW (ref 0.3–1.3)
BUN/CREA RATIO: 17 (ref 6–22)
BUN: 11 mg/dL (ref 8–25)
CALCIUM: 8.7 mg/dL (ref 8.6–10.2)
CHLORIDE: 104 mmol/L (ref 96–111)
CO2 TOTAL: 26 mmol/L (ref 22–30)
CREATININE: 0.63 mg/dL (ref 0.60–1.05)
GLUCOSE: 108 mg/dL (ref 65–125)
POTASSIUM: 4 mmol/L (ref 3.7–5.3)
PROTEIN TOTAL: 5.5 g/dL — ABNORMAL LOW (ref 6.0–7.9)
SODIUM: 137 mmol/L (ref 136–145)
eGFRcr - FEMALE: >90 mL/min/BSA (ref >=60)

## 2023-08-04 LAB — CBC WITH DIFF
HCT: 32 % — ABNORMAL LOW (ref 34.8–46.0)
HGB: 10.7 g/dL — ABNORMAL LOW (ref 11.5–16.0)
MCH: 31.9 pg (ref 26.0–32.0)
MCHC: 33.4 g/dL (ref 31.0–35.5)
MCV: 95.5 fL (ref 78.0–100.0)
MPV: 9.1 fL (ref 8.7–12.5)
PLATELETS: 272 x10ˆ3/uL (ref 150–400)
RBC: 3.35 x10ˆ6/uL — ABNORMAL LOW (ref 3.85–5.22)
RDW-CV: 13.7 % (ref 11.5–15.5)
WBC: 6 10*3/uL (ref 3.7–11.0)

## 2023-08-04 LAB — MANUAL DIFF AND MORPHOLOGY-SYSMEX
BASOPHIL #: 0.12 x10ˆ3/uL (ref <=0.20)
BASOPHIL %: 2 %
EOSINOPHIL #: 0.78 x10ˆ3/uL — ABNORMAL HIGH (ref <=0.50)
EOSINOPHIL %: 13 %
LYMPHOCYTE #: 1.92 x10ˆ3/uL (ref 1.00–4.80)
LYMPHOCYTE %: 31 %
MONOCYTE #: 0.6 x10ˆ3/uL (ref 0.20–1.10)
MONOCYTE %: 10 %
NEUTROPHIL #: 2.58 x10ˆ3/uL (ref 1.50–7.70)
NEUTROPHIL %: 43 %
NRBC FROM MANUAL DIFF: 1 /100{WBCs} (ref <=1)
RBC MORPHOLOGY: NORMAL
REACTIVE LYMPHOCYTE %: 1 %

## 2023-08-04 LAB — PREALBUMIN: PREALBUMIN: 24.8 mg/dL (ref 18.0–45.0)

## 2023-08-04 LAB — MAGNESIUM: MAGNESIUM: 1.9 mg/dL (ref 1.8–2.6)

## 2023-08-04 NOTE — Care Management Notes (Signed)
 Referral Information  ++++++ Placed Provider #1 ++++++  Case Manager: Lenord Fellers  Provider Type: Acute -Rehab  Provider Name: [facility]  Address:  [address]  [place], [place] [address]  Contact: Emelda Brothers    Phone: [phone] x  Fax:   Fax: [phone]

## 2023-08-06 NOTE — Cancer Center Note (Signed)
 UROLOGY ONCOLOGY, [facility]  [address]  [place] [address]  Operated by [facility]  Telephone Visit    Name:  Crimson Dubberly MRN: [id number]   Date:  08/07/2023 DOB: 04/10/1970 (53 y.o.)          The patient/family initiated a request for telephone service.  Verbal consent for this service was obtained from the patient/family.  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 Her post-operative visit was unfortunately delayed due to hospital admission for left diaphragmatic hernia following left nephrectomy in 06/2023. She underwent RAL diaphragm hernia repair with PEG tube placement on 6/20. She was also found to have pulmonary emboli and follows with hematology and is taking Eliquis . She has a remote history of astrocytoma brain cancer s/p resection and chemoradiation.     Plan:  She is following with medical oncology at [facility], next appt is 7/9. She was also evaluated inpatient be medical oncology.   Follow up in 4 months with CT chest, MRU, and BMP. These can be completed locally with telephone visit to review due to travel burden to [place].     No diagnosis found.    {LOS Determination:54221:::1}    Jeoffrey Barber, FNP-C

## 2023-08-07 ENCOUNTER — Other Ambulatory Visit: Payer: Self-pay

## 2023-08-07 ENCOUNTER — Ambulatory Visit: Attending: THORACIC SURGERY CARDIOTHORACIC VASCULAR SURGERY | Admitting: Family

## 2023-08-07 DIAGNOSIS — Z9089 Acquired absence of other organs: Secondary | ICD-10-CM

## 2023-08-07 DIAGNOSIS — Z905 Acquired absence of kidney: Secondary | ICD-10-CM

## 2023-08-07 DIAGNOSIS — Z9889 Other specified postprocedural states: Secondary | ICD-10-CM

## 2023-08-07 DIAGNOSIS — C649 Malignant neoplasm of unspecified kidney, except renal pelvis: Secondary | ICD-10-CM

## 2023-08-11 ENCOUNTER — Other Ambulatory Visit: Attending: Internal Medicine | Admitting: Internal Medicine

## 2023-08-11 ENCOUNTER — Ambulatory Visit (HOSPITAL_BASED_OUTPATIENT_CLINIC_OR_DEPARTMENT_OTHER): Payer: Self-pay | Admitting: Family

## 2023-08-11 LAB — CBC/DIFF - CLIENT CONSOLIDATED
BASOPHIL #: <0.1 x10ˆ3/uL (ref <=0.20)
BASOPHIL %: 1 %
EOSINOPHIL #: 0.36 x10ˆ3/uL (ref <=0.50)
EOSINOPHIL %: 7 %
HCT: 33.6 % — ABNORMAL LOW (ref 34.8–46.0)
HGB: 11 g/dL — ABNORMAL LOW (ref 11.5–16.0)
IMMATURE GRANULOCYTE #: <0.1 x10ˆ3/uL (ref <0.10)
IMMATURE GRANULOCYTE %: 0.4 % (ref 0.0–1.0)
LYMPHOCYTE #: 2.12 x10ˆ3/uL (ref 1.00–4.80)
LYMPHOCYTE %: 41 %
MCH: 31.7 pg (ref 26.0–32.0)
MCHC: 32.7 g/dL (ref 31.0–35.5)
MCV: 96.8 fL (ref 78.0–100.0)
MONOCYTE #: 0.37 x10ˆ3/uL (ref 0.20–1.10)
MONOCYTE %: 7.2 %
MPV: 9.2 fL (ref 8.7–12.5)
NEUTROPHIL #: 2.25 x10ˆ3/uL (ref 1.50–7.70)
NEUTROPHIL %: 43.4 %
PLATELETS: 292 x10ˆ3/uL (ref 150–400)
RBC: 3.47 x10ˆ6/uL — ABNORMAL LOW (ref 3.85–5.22)
RDW-CV: 12.9 % (ref 11.5–15.5)
WBC: 5.2 x10ˆ3/uL (ref 3.7–11.0)

## 2023-08-11 LAB — CBC WITH DIFF
BASOPHIL #: <0.1 x10ˆ3/uL (ref <=0.20)
BASOPHIL %: 1 %
EOSINOPHIL #: 0.36 x10ˆ3/uL (ref <=0.50)
EOSINOPHIL %: 7 %
HCT: 33.6 % — ABNORMAL LOW (ref 34.8–46.0)
HGB: 11 g/dL — ABNORMAL LOW (ref 11.5–16.0)
IMMATURE GRANULOCYTE #: <0.1 x10ˆ3/uL (ref <0.10)
IMMATURE GRANULOCYTE %: 0.4 % (ref 0.0–1.0)
LYMPHOCYTE #: 2.12 x10ˆ3/uL (ref 1.00–4.80)
LYMPHOCYTE %: 41 %
MCH: 31.7 pg (ref 26.0–32.0)
MCHC: 32.7 g/dL (ref 31.0–35.5)
MCV: 96.8 fL (ref 78.0–100.0)
MONOCYTE #: 0.37 x10ˆ3/uL (ref 0.20–1.10)
MONOCYTE %: 7.2 %
MPV: 9.2 fL (ref 8.7–12.5)
NEUTROPHIL #: 2.25 x10ˆ3/uL (ref 1.50–7.70)
NEUTROPHIL %: 43.4 %
PLATELETS: 292 x10ˆ3/uL (ref 150–400)
RBC: 3.47 x10ˆ6/uL — ABNORMAL LOW (ref 3.85–5.22)
RDW-CV: 12.9 % (ref 11.5–15.5)
WBC: 5.2 x10ˆ3/uL (ref 3.7–11.0)

## 2023-08-11 LAB — BASIC METABOLIC PANEL
ANION GAP: 8 mmol/L (ref 4–13)
BUN/CREA RATIO: 14 (ref 6–22)
BUN: 10 mg/dL (ref 8–25)
CALCIUM: 9.1 mg/dL (ref 8.6–10.2)
CHLORIDE: 106 mmol/L (ref 96–111)
CO2 TOTAL: 25 mmol/L (ref 22–30)
CREATININE: 0.7 mg/dL (ref 0.60–1.05)
ESTIMATED GFR - FEMALE: >90 mL/min/BSA (ref >=60)
GLUCOSE: 93 mg/dL (ref 65–125)
POTASSIUM: 4.1 mmol/L (ref 3.5–5.1)
SODIUM: 139 mmol/L (ref 136–145)

## 2023-08-11 NOTE — Telephone Encounter (Signed)
 Message from Adrien HERO sent at 08/11/2023  3:48 PM EDT    Summary: New Appointment    Copied From CRM [phone].  [place], Shambhavi (Self) called to schedule an appointment.  Hajiran pts Olam is calling to see if she can set up a f/u for this week as the pt is in [facility] and is getting out on Wednesday as she lives several away.  It's a f/u from her procedure done on 5.15.25. Please advise.                Call History    Contact Date/Time Type Contact Phone/Fax By   08/11/2023 03:47 PM EDT Phone (Incoming) Olam Borer (Emergency Contact) [phone] BENNIE) Silva Adrien   08/11/2023 03:41 PM EDT Phone ([address]) [place], [place] (Self) [phone] MIKE) [place], Adrien     I was not able to reach [place] at this time. A voicemail was left with a callback number.     Per the last note from [facility], FNP-C     Follow up in 4 months with CT chest, MRI abdomen, and BMP. These can be completed locally with telephone visit to review due to travel burden to [facility]     I will await a call back to advise of the above.    Nat Beery, RN

## 2023-08-13 ENCOUNTER — Other Ambulatory Visit: Payer: Self-pay

## 2023-08-13 ENCOUNTER — Ambulatory Visit
Attending: THORACIC SURGERY CARDIOTHORACIC VASCULAR SURGERY | Admitting: THORACIC SURGERY CARDIOTHORACIC VASCULAR SURGERY

## 2023-08-13 ENCOUNTER — Ambulatory Visit (HOSPITAL_BASED_OUTPATIENT_CLINIC_OR_DEPARTMENT_OTHER)
Admission: RE | Admit: 2023-08-13 | Discharge: 2023-08-13 | Disposition: A | Discharge status: Home / Self Care | Source: Ambulatory Visit

## 2023-08-13 ENCOUNTER — Other Ambulatory Visit (INDEPENDENT_AMBULATORY_CARE_PROVIDER_SITE_OTHER): Payer: Self-pay

## 2023-08-13 ENCOUNTER — Encounter (INDEPENDENT_AMBULATORY_CARE_PROVIDER_SITE_OTHER): Payer: Self-pay | Admitting: HEMATOLOGY-ONCOLOGY

## 2023-08-13 ENCOUNTER — Encounter (HOSPITAL_COMMUNITY): Payer: Self-pay | Admitting: THORACIC SURGERY CARDIOTHORACIC VASCULAR SURGERY

## 2023-08-13 VITALS — BP 121/73 | HR 74 | Temp 96.3°F | Ht 62.0 in

## 2023-08-13 DIAGNOSIS — K449 Diaphragmatic hernia without obstruction or gangrene: Secondary | ICD-10-CM

## 2023-08-13 DIAGNOSIS — Z931 Gastrostomy status: Secondary | ICD-10-CM

## 2023-08-13 DIAGNOSIS — Z9889 Other specified postprocedural states: Secondary | ICD-10-CM

## 2023-08-13 DIAGNOSIS — Z8719 Personal history of other diseases of the digestive system: Secondary | ICD-10-CM

## 2023-08-13 DIAGNOSIS — Z09 Encounter for follow-up examination after completed treatment for conditions other than malignant neoplasm: Secondary | ICD-10-CM

## 2023-08-13 DIAGNOSIS — J9811 Atelectasis: Secondary | ICD-10-CM

## 2023-08-15 NOTE — Progress Notes (Signed)
 Thoracic Surgery, [facility]  [address]  [place] [address]  [phone]    Visit Date: 08/13/2023    Referring Provider: Referral Self  PCP: No Pcp    Reason For Visit: follow up     History of Present Illness:  Elizabeth [name] is a 53 y.o. 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 Patient was taken to the OR 07/29/23 for flexible EGD, lysis of adhesions, robotic assisted repair of left posterior diaphragmatic hernia, gastropexy, and PEG tube placement. Patient sent to SNF with PEG tube in place with plans vent PRN. Patient sent home on a SOFT DIET for 6 weeks. Patient follows up in clinic today for post op evaluation.     Patient reports that she remains on a soft diet and is tolerating well. Patient has had her f/u appointment with urology and has a follow up with heme/onc scheduled. She denies any issues. PEG tube sit is CDI. No fevers, chest pain, nausea, or vomiting.     Current Medications:  Current Outpatient Medications   Medication Sig Dispense Refill    atorvastatin  (LIPITOR) 20 mg Oral Tablet Take 1 Tablet (20 mg total) by mouth Every night      enoxaparin  (LOVENOX ) 80 mg/0.8 mL Subcutaneous Syringe Inject 0.8 mL (80 mg total) under the skin Twice daily for 180 days      ergocalciferol , vitamin D2, (DRISDOL ) 1,250 mcg (50,000 unit) Oral Capsule Take 1 Capsule (50,000 Units total) by mouth Every Sunday      escitalopram  oxalate (LEXAPRO ) 20 mg Oral Tablet Take 1 Tablet (20 mg total) by mouth Daily      famotidine  (PEPCID ) 20 mg Oral Tablet Take 1 Tablet (20 mg total) by mouth Daily      FREESTYLE LANCETS 28 gauge Misc TEST ONCE DAILY      FREESTYLE LITE METER Kit EVERY DAY      FREESTYLE LITE STRIPS Strip TEST EVERY DAY      hydroCHLOROthiazide   (HYDRODIURIL ) 25 mg Oral Tablet Take 1 Tablet (25 mg total) by mouth Every morning      levothyroxine  (SYNTHROID ) 25 mcg Oral Tablet TAKE 1 TABLET ONCE A DAY IN THE MORNING 30 MINUTES BEFORE MEALS OR ANY OTHER MEDICATIONS      losartan  (COZAAR ) 100 mg Oral Tablet Take 1 Tablet (100 mg total) by mouth Daily      magnesium  Oxide 420 mg Oral Tablet Take 1 Tablet (420 mg total) by mouth Daily      methocarbamoL  (ROBAXIN ) 500 mg Oral Tablet Take 1 Tablet (500 mg total) by mouth Four times a day as needed      oxyCODONE  (ROXICODONE ) 5 mg Oral Tablet Take 1 Tablet (5 mg total) by mouth Every 6 hours as needed for Pain 20 Tablet 0    phenytoin  sodium extended release (DILANTIN ) 100 mg Oral Capsule TAKE 3 CAPSULES BY MOUTH TWICE A DAY      semaglutide  (OZEMPIC ) 0.25 mg or 0.5 mg(2 mg/1.5 mL) Subcutaneous Pen Injector Inject 0.5 mg under the skin Every 7 days       No current facility-administered medications for this visit.         Physical Exam:  BP 121/73   Pulse 74   Temp (!) 35.7 C (96.3 F) (Thermal Scan)  Ht 1.575 m (5' 2)   SpO2 100%   BMI 32.54 kg/m       Wt Readings from Last 3 Encounters:   08/02/23 80.7 kg (177 lb 14.6 oz)   07/23/23 85.3 kg (188 lb)   07/16/23 89.4 kg (197 lb)       General: no acute distress, appears stated age  Head: normocephalic, atraumatic  Eyes anicteric, extraocular movements are intact  Neck: trachea midline, no JVD  Chest: clear bilaterally, normal excursion   Heart: RRR.  Abdomen: soft, non-distended   Lymphatics: no palpable adenopathy  Extremeties: no edema, cyanosis  Skin: normal turgor, non-icteric, no obvious rashes  Neuro: no focal neurological deficit  Psych: normal affect and judgement      Radiology:  (images and reports personally reviewed):    XR CHEST PA AND LATERAL performed on 08/13/2023 1:36 PM.     IMPRESSION:  No interval change in radiographic appearance of the chest.         ASSESSMENT:  53 yo s/p flexible EGD, lysis of adhesions, robotic assisted repair of  left posterior diaphragmatic hernia, gastropexy, and PEG tube placement 07/29/23 for diaphragmatic hernia s/p L partial nephrectomy. Patient tolerating a soft diet. G tube in place.     PLAN:  -RTC in 4 weeks for G tube removal  -Patient will need to hold Lovenox  24 hrs prior to removal  -Will need workup for esophageal submucosal nodule later  -This patient was also seen and evaluated by Dr. Fredonia. The patient was given the opportunity to ask questions and review today's plan of care. All questions were answered today in clinic and they are in agreement with current plan of care. Patient was instructed to call/return to clinic if any questions or concerning symptoms arise.     I independently of the faculty provider spent a total of (15) minutes in direct/indirect care of this patient including initial evaluation, review of laboratory, radiology, diagnostic studies, review of medical record, order entry and coordination of care.      Rankin Gosling, PA-C          I attest that I both saw and examined the patient during the outpatient visit with Rankin Gosling, PA-C.  All pertinent patient history, medical records, lab tests, and related imaging studies were personally reviewed. See her note for details. I agree with the assessment and plan as outlined and any exceptions or additions are edited and/or summarized below.     As part of this shared service with the APP, I spent 20 minutes of the total clinical encounter in independent patient care and counseling without APP overlap. This care included initial evaluation, review of laboratory, radiology, diagnostic/imaging studies, review of medical record, order entry, patient and family discussions, and coordination of care with other providers.     Cornel Fredonia, MD

## 2023-08-20 ENCOUNTER — Ambulatory Visit (INDEPENDENT_AMBULATORY_CARE_PROVIDER_SITE_OTHER): Payer: Self-pay | Admitting: HEMATOLOGY-ONCOLOGY

## 2023-08-20 ENCOUNTER — Ambulatory Visit (INDEPENDENT_AMBULATORY_CARE_PROVIDER_SITE_OTHER): Payer: Self-pay

## 2023-09-12 ENCOUNTER — Ambulatory Visit (HOSPITAL_COMMUNITY): Payer: Self-pay | Admitting: THORACIC SURGERY CARDIOTHORACIC VASCULAR SURGERY

## 2023-09-12 NOTE — Telephone Encounter (Addendum)
-----   Message from Avelina B sent at 09/10/2023  2:48 PM EDT -----  Regarding: RE: Clinical Question  ----- Message from Avelina NOVAK sent at 09/10/2023  2:25 PM EDT -----  Copied From CRM #[id number].  [name], Elizabeth (Self) called with a clinical question.     Pt of Dr Voppuru    Pt is asking when her peg tube can come out.    Pt can be reached at [phone] or [phone].    Advised patient of the 24/48hr Refill/return call policy      Thank you    RN called the patient and let her know she can ask her PCP if and see if she can take it out but if not she will have to come back to clinic to get it removed. She verbalized understanding.

## 2023-10-14 ENCOUNTER — Other Ambulatory Visit: Payer: Self-pay

## 2023-10-15 ENCOUNTER — Encounter (HOSPITAL_COMMUNITY): Payer: Self-pay | Admitting: HEMATOLOGY-ONCOLOGY

## 2023-10-15 ENCOUNTER — Ambulatory Visit: Payer: Self-pay | Attending: HEMATOLOGY-ONCOLOGY | Admitting: HEMATOLOGY-ONCOLOGY

## 2023-10-15 ENCOUNTER — Ambulatory Visit (INDEPENDENT_AMBULATORY_CARE_PROVIDER_SITE_OTHER): Admission: RE | Admit: 2023-10-15 | Payer: Self-pay | Source: Ambulatory Visit

## 2023-10-15 ENCOUNTER — Telehealth (INDEPENDENT_AMBULATORY_CARE_PROVIDER_SITE_OTHER): Payer: Self-pay | Admitting: HEMATOLOGY-ONCOLOGY

## 2023-10-15 ENCOUNTER — Encounter (INDEPENDENT_AMBULATORY_CARE_PROVIDER_SITE_OTHER): Payer: Self-pay | Admitting: HEMATOLOGY-ONCOLOGY

## 2023-10-15 ENCOUNTER — Other Ambulatory Visit: Payer: Self-pay

## 2023-10-15 VITALS — BP 138/63 | HR 81 | Temp 97.6°F | Ht 62.0 in | Wt 189.6 lb

## 2023-10-15 DIAGNOSIS — Z923 Personal history of irradiation: Secondary | ICD-10-CM | POA: Insufficient documentation

## 2023-10-15 DIAGNOSIS — Z85841 Personal history of malignant neoplasm of brain: Secondary | ICD-10-CM | POA: Insufficient documentation

## 2023-10-15 DIAGNOSIS — Z9221 Personal history of antineoplastic chemotherapy: Secondary | ICD-10-CM | POA: Insufficient documentation

## 2023-10-15 DIAGNOSIS — C642 Malignant neoplasm of left kidney, except renal pelvis: Secondary | ICD-10-CM

## 2023-10-15 DIAGNOSIS — C649 Malignant neoplasm of unspecified kidney, except renal pelvis: Secondary | ICD-10-CM | POA: Insufficient documentation

## 2023-10-15 NOTE — Progress Notes (Addendum)
 Department of Hematology/Oncology  Progress Note   Name: Elizabeth [name]  FMW:[id number]  Date of Birth: 1971-01-10  Encounter Date: 10/15/2023    REFERRING PROVIDER:  Pcp, No  No address on file    TELEMEDICINE DOCUMENTATION:  Patient Location:  [facility] outpatient Hematology/Oncology [address], 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  She was treated with surgery followed by temozolomide plus radiation.  The combination therapy was finished in October 2015.    Clinically, she is been doing well since that time.  She does have some aphasia and word-finding difficulty.    ROS:   Pertinent review of systems as discussed in HPI    HISTORY:  Past Medical History:   Diagnosis Date    Anemia     Cancer (CMS HCC)     brain tumor 8/15 with chemo and radiation    Chest pain, unspecified type 07/17/2023    CPAP (continuous positive airway pressure) dependence     CVA (cerebrovascular accident)     mini during brain surgery per pt    Diabetes mellitus, type 2     Diabetes mellitus, type 2     Disorder of thyroid      Dyspnea, unspecified type 07/17/2023    Epileptic seizure (CMS HCC)     Essential hypertension     GERD (gastroesophageal reflux disease) 08/02/2023    History of kidney surgery 06/2023    Pulmonary embolism 07/16/2023    Sleep apnea          Past Surgical History:   Procedure Laterality Date    BRAIN SURGERY      HX CHOLECYSTECTOMY           Social History     Socioeconomic History    Marital status: Divorced     Spouse name: Not on file    Number of children: Not on file    Years of education: Not on file    Highest education level: Not on file    Occupational History    Not on file   Tobacco Use    Smoking status: Never    Smokeless tobacco: Never   Vaping Use    Vaping status: Never Used   Substance and Sexual Activity    Alcohol  use: Never    Drug use: Never    Sexual activity: Not on file   Other Topics Concern    Ability to Walk 1 Flight of Steps without SOB/CP Yes    Routine Exercise No    Ability to Walk 2 Flight of Steps without SOB/CP No    Unable to Ambulate Not Asked    Total Care Not Asked    Ability To Do Own ADL's Yes    Uses Walker Not Asked    Other Activity Level Not Asked    Uses Cane Not Asked   Social History Narrative    Not on file     Social Determinants of Health     Financial Resource Strain: Low Risk (07/24/2023)    Financial Resource Strain     SDOH Financial: No   Transportation Needs: High Risk (07/24/2023)  Transportation Needs     SDOH Transportation: Yes, it has kept me from medical appointments or from getting my medications   Social Connections: Low Risk (07/24/2023)    Social Connections     SDOH Social Isolation: 5 or more times a week   Intimate Partner Violence: Not on file   Housing Stability: Low Risk (07/24/2023)    Housing Stability     SDOH Housing Situation: I have housing.     SDOH Housing Worry: No     Family Medical History:       Problem Relation (Age of Onset)    Arthritis-osteo Mother    Breast Cancer Paternal Grandmother    Heart Disease Father    Hypertension (High Blood Pressure) Mother    No Known Problems Sister, Brother, Maternal Grandmother, Maternal Grandfather, Paternal Grandfather, Daughter, Son, Maternal Aunt, Maternal Uncle, Paternal Aunt, Paternal Uncle, Other            Current Outpatient Medications   Medication Sig    atorvastatin  (LIPITOR) 20 mg Oral Tablet Take 1 Tablet (20 mg total) by mouth Every night    ergocalciferol , vitamin D2, (DRISDOL ) 1,250 mcg (50,000 unit) Oral Capsule Take 1 Capsule (50,000 Units total) by mouth Every Sunday    escitalopram  oxalate (LEXAPRO ) 20 mg Oral Tablet  Take 1 Tablet (20 mg total) by mouth Daily    famotidine  (PEPCID ) 20 mg Oral Tablet Take 1 Tablet (20 mg total) by mouth Daily    FREESTYLE LANCETS 28 gauge Misc TEST ONCE DAILY    FREESTYLE LITE METER Kit EVERY DAY    FREESTYLE LITE STRIPS Strip TEST EVERY DAY    hydroCHLOROthiazide  (HYDRODIURIL ) 25 mg Oral Tablet Take 1 Tablet (25 mg total) by mouth Every morning    levothyroxine  (SYNTHROID ) 25 mcg Oral Tablet TAKE 1 TABLET ONCE A DAY IN THE MORNING 30 MINUTES BEFORE MEALS OR ANY OTHER MEDICATIONS    losartan  (COZAAR ) 100 mg Oral Tablet Take 1 Tablet (100 mg total) by mouth Daily    magnesium  Oxide 420 mg Oral Tablet Take 1 Tablet (420 mg total) by mouth Daily    methocarbamoL  (ROBAXIN ) 500 mg Oral Tablet Take 1 Tablet (500 mg total) by mouth Four times a day as needed    oxyCODONE  (ROXICODONE ) 5 mg Oral Tablet Take 1 Tablet (5 mg total) by mouth Every 6 hours as needed for Pain    phenytoin  sodium extended release (DILANTIN ) 100 mg Oral Capsule TAKE 3 CAPSULES BY MOUTH TWICE A DAY     Allergies   Allergen Reactions    Ace Inhibitors      Other reaction(s): Angioedema       PHYSICAL EXAM:  Most Recent Vitals    Flowsheet Row Office Visit from 07/05/2021 [facility]   Temperature 36.8 C (98.3 F) filed at... 07/05/2021 1548   Heart Rate 76 filed at... 07/05/2021 1548   Respiratory Rate --   BP (Non-Invasive) 158/70 filed at... 07/05/2021 1548   SpO2 --   Height 1.575 m (5' 2) filed at... 07/05/2021 1548   Weight 94.5 kg (208 lb 4.8 oz) filed at... 07/05/2021 1548   BMI (Calculated) 38.18 filed at... 07/05/2021 1548   BSA (Calculated) 2.03 filed at... 07/05/2021 1548      ECOG Status: (1) Restricted in physically strenuous activity, ambulatory and able to do work of light nature   Physical Exam  Constitutional:       General: She is not in acute distress.  Appearance: Normal appearance.   Eyes:      Extraocular Movements: Extraocular movements intact.   Cardiovascular:       Rate and Rhythm: Normal rate and regular rhythm.   Pulmonary:      Effort: Pulmonary effort is normal.   Abdominal:      General: Abdomen is flat.      Palpations: Abdomen is soft.   Musculoskeletal:         General: Normal range of motion.      Cervical back: Normal range of motion.   Skin:     General: Skin is warm and dry.   Neurological:      General: No focal deficit present.      Mental Status: She is alert.   Psychiatric:         Mood and Affect: Mood normal.         DIAGNOSTIC DATA:  No results found for this or any previous visit (from the past 82479 hours).    LABS:   CBC  Diff   Lab Results   Component Value Date/Time    WBC 5.2 08/11/2023 07:26 AM    WBC 5.2 08/11/2023 07:26 AM    HGB 11.0 (L) 08/11/2023 07:26 AM    HGB 11.0 (L) 08/11/2023 07:26 AM    HCT 33.6 (L) 08/11/2023 07:26 AM    HCT 33.6 (L) 08/11/2023 07:26 AM    PLTCNT 292 08/11/2023 07:26 AM    PLTCNT 292 08/11/2023 07:26 AM    RBC 3.47 (L) 08/11/2023 07:26 AM    RBC 3.47 (L) 08/11/2023 07:26 AM    MCV 96.8 08/11/2023 07:26 AM    MCV 96.8 08/11/2023 07:26 AM    MCHC 32.7 08/11/2023 07:26 AM    MCHC 32.7 08/11/2023 07:26 AM    MCH 31.7 08/11/2023 07:26 AM    MCH 31.7 08/11/2023 07:26 AM    RDW 15.6 07/23/2023 03:44 AM    MPV 9.2 08/11/2023 07:26 AM    MPV 9.2 08/11/2023 07:26 AM    Lab Results   Component Value Date/Time    PMNS 43.4 08/11/2023 07:26 AM    PMNS 43.4 08/11/2023 07:26 AM    LYMPHOCYTES 31 08/04/2023 06:46 AM    LYMPHOCYTES 31 08/04/2023 06:46 AM    EOSINOPHIL 13 08/04/2023 06:46 AM    EOSINOPHIL 13 08/04/2023 06:46 AM    MONOCYTES 7.2 08/11/2023 07:26 AM    MONOCYTES 7.2 08/11/2023 07:26 AM    BASOPHILS 1.0 08/11/2023 07:26 AM    BASOPHILS <0.10 08/11/2023 07:26 AM    BASOPHILS 1.0 08/11/2023 07:26 AM    BASOPHILS <0.10 08/11/2023 07:26 AM    PMNABS 2.25 08/11/2023 07:26 AM    PMNABS 2.25 08/11/2023 07:26 AM    LYMPHSABS 2.12 08/11/2023 07:26 AM    LYMPHSABS 2.12 08/11/2023 07:26 AM    EOSABS 0.36 08/11/2023 07:26 AM     EOSABS 0.36 08/11/2023 07:26 AM    MONOSABS 0.37 08/11/2023 07:26 AM    MONOSABS 0.37 08/11/2023 07:26 AM            Comprehensive Metabolic Profile    Lab Results   Component Value Date    SODIUM 139 08/11/2023    POTASSIUM 4.1 08/11/2023    CHLORIDE 106 08/11/2023    CO2 25 08/11/2023    ANIONGAP 8 08/11/2023    BUN 10 08/11/2023    CREATININE 0.70 08/11/2023    ALBUMIN  3.3 (L) 08/04/2023    CALCIUM  9.1 08/11/2023  GLUCOSENF 124 (H) 07/23/2023    ALKPHOS 122 08/04/2023    ALT 36 (H) 08/04/2023    AST 25 08/04/2023    TOTBILIRUBIN 0.2 (L) 08/04/2023    TOTALPROTEIN 5.5 (L) 08/04/2023          BASIC METABOLIC PANEL  Lab Results   Component Value Date    SODIUM 139 08/11/2023    POTASSIUM 4.1 08/11/2023    CHLORIDE 106 08/11/2023    CO2 25 08/11/2023    ANIONGAP 8 08/11/2023    BUN 10 08/11/2023    CREATININE 0.70 08/11/2023    BUNCRRATIO 14 08/11/2023    GFR >90 08/11/2023    CALCIUM  9.1 08/11/2023    GLUCOSENF 124 (H) 07/23/2023           ASSESSMENT:  Problem List Items Addressed This Visit    None  Visit Diagnoses         Malignant neoplasm of kidney, unspecified laterality (CMS HCC)    -  Primary    Relevant Orders    Referral to ONCOLOGY INFUSION - [place] - (Chemo, Hydration, Injection, Infusion, Pheresis, Photopheresis))             ICD-10-CM    1. Malignant neoplasm of kidney, left (CMS HCC)  C64.2       2. Malignant neoplasm of kidney, unspecified laterality (CMS HCC)  C64.9 Referral to ONCOLOGY INFUSION - [place] - (Chemo, Hydration, Injection, Infusion, Pheresis, Photopheresis))     Referral to GENERAL SURGERY - [place] - DUREMDES, MULLINS, HOPKINS           PLAN:   1. All relevant medical records were reviewed including available pertinent provider notes, procedure notes, imaging, laboratory, and pathology.   2. All pertinent labs and/or imaging were reviewed with the patient.   3. Anaplastic astrocytoma:  The patient received definitive treat 8 years ago and has been stable since that time.  At  this point, we will see her back every 6 months for ongoing surveillance.    Etrulia Renk was given the chance to ask questions, and these were answered to their satisfaction. The patient is welcome to call with any questions or concerns in the meantime.     On the day of the encounter, a total of 35 minutes was spent on this patient encounter including review of historical information, examination, documentation and post-visit activities.   Return in about 1 week (around 10/22/2023).     Lynwood Pipes, MD  10/15/2023 , 15:16  The patient was seen as part of a collaborative telemedicine service with Dr. Pipes who participated in the encounter by active presence via approved video/audio means for portions of the encounter.    The patient's insurance company bears full legal and financial responsibility resulting from any deviations that they cause to my recommended treatment plan.    CC:  No Pcp  No address on file    Pcp, No  No address on file    This note was partially generated using MModal Fluency Direct system, and there may be some incorrect words, spellings, and punctuation that were not noted in checking the note before saving.

## 2023-10-15 NOTE — Nursing Note (Signed)
 Patient in room. Patient had left kidney removed. Patient is stage 3. Went over NCCN guidelines. Patient to get Keytruda. Chemo consent signed, side effects explained and information given. Patient doesn't have a port. All questions answered. Patient to continue Lovenox . Patient is wanting PEG removed. Return in a couple months.

## 2023-10-17 ENCOUNTER — Other Ambulatory Visit (INDEPENDENT_AMBULATORY_CARE_PROVIDER_SITE_OTHER): Payer: Self-pay

## 2023-10-17 ENCOUNTER — Other Ambulatory Visit (INDEPENDENT_AMBULATORY_CARE_PROVIDER_SITE_OTHER): Payer: Self-pay | Admitting: HEMATOLOGY-ONCOLOGY

## 2023-10-17 ENCOUNTER — Encounter (HOSPITAL_COMMUNITY): Payer: Self-pay | Admitting: HEMATOLOGY-ONCOLOGY

## 2023-10-17 ENCOUNTER — Encounter (INDEPENDENT_AMBULATORY_CARE_PROVIDER_SITE_OTHER): Payer: Self-pay | Admitting: HEMATOLOGY-ONCOLOGY

## 2023-10-17 MED ORDER — ONDANSETRON 4 MG DISINTEGRATING TABLET
4.0000 mg | ORAL_TABLET | Freq: Four times a day (QID) | ORAL | 3 refills | Status: AC | PRN
Start: 2023-10-17 — End: ?

## 2023-10-17 NOTE — Nursing Note (Signed)
 Diagnosis: Kidney cancer  Patient completed psychosocial distress screening using the Enhanced NCCN Distress Thermometer (DT) Tool and self-reported an overall score of 10. Patient reports stress and anxiety regarding diagnosis and treatment as main concern. Patient completed social needs screening tool and reports bug infestation, inadequate heat, and transportation as concerns. Informed Child psychotherapist of reported concerns. 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 Patient's son and autistic daughter lives with her. Patient reports her daughter does require a fair bit of care and reports her mother stays with her daughter when she has appointments or errands to run. Patient ambulates with assistance of cane or wheeled walker and is able to complete all activities independently. Patient reports she does have a vehicle but does not drive and reports her sister or son bring her to appointments. Advised patient to inform us  if transportation will be an issue so we can assist. Assessed for barriers to learning, language, learning preference, and teaching needs. Verbally reviewed that appointments may be rescheduled but missed appointments will be followed up with a phone call or letter from our clinic. Goals of treatment, side effects, and infertility risk verbally reviewed by physician at the time of signing an informed consent for anticancer treatment. Assessed patient's ability to adhere to prescribed medications and addressed possible barriers to adherence. A copy of signed consent was given to the patient and a copy was scanned into patient's chart.   Treatment plan verbally reviewed and written information provided on: Pembrolizumab  Supportive Medications: Zofran   Generalized immunotherapy and medication specific side effects reviewed.  Schedule: every 21 days  Planned Duration: until disease progression or intolerability/16 cycles    The following written information was  provided and reviewed:  [employer] Chemotherapy and You  ASCO Answers: Understanding Immunotherapy  Administering Chemotherapy at [employer] [facility] [employer]     [employer] telephone triage phone number given to patient with verbal instructions on how to access this support should the patient develop symptoms, side effects and when necessary go to a local emergency room. Blood work between chemotherapy treatments will be at [employer]. Referral sent to surgery for portacath placement. Gave patient verbal and written instructions regarding portacath. Patient also reports she has a PEG tube that was inserted s/p hernia repair surgery at [facility] 07/24/2023 and she would  like to have this removed but does not want to make the 4.5 hour trip back to the surgeon that inserted the PEG tube to have it removed and would like to have it done closer to home. Advised patient that will call inserting provider and obtain consent to have it removed and see if surgeon can remove PEG tube when PAC is inserted. Care will be coordinated with outpatient oncology department. Patient was engaged during the treatment plan education and verbalized understanding of treatment plan. Patient has active MyChart status and is aware of how to use. All questions and concerns were answered to satisfaction.

## 2023-10-17 NOTE — Nursing Note (Signed)
 Sent secure chat to Dr. Fredonia and Rankin Gosling, NP to obtain consent to have PEG tube removed as he last saw patient in July and documented that patient could have it removed in 4 weeks. Patient has not used PEG tube, reports good appetite and PO intake, and denies any difficulty swallowing. Obtained consent to have PEG tube removed by local surgeon. Sent secure chat to A. Gills requesting that this be done when patient's [facility] is placed, patient is seeing Dr. Marlyce as NPV on 10/23/2023  and he will inform.

## 2023-10-20 ENCOUNTER — Encounter (HOSPITAL_COMMUNITY): Payer: Self-pay | Admitting: HEMATOLOGY-ONCOLOGY

## 2023-10-20 ENCOUNTER — Encounter (INDEPENDENT_AMBULATORY_CARE_PROVIDER_SITE_OTHER): Payer: Self-pay

## 2023-10-20 NOTE — Progress Notes (Addendum)
 LSW called and spoke to patient regarding concerns from Social Needs Screening Tool.  Patient stated that she has previously had [facility] with speech and physical therapy's.  Patient stated that she rented the home, and the landlord was generally good about working with the family.  Patient had difficulty speaking over the phone, and had her son assist with the call.  Patient son BJ advised that they had some water  come to the home when it rained a lot due to living [place] a [place].  Patient stated that there were water  leaks and drafts, but patient son denied this.  LSW provided education on local resources that could help with utility bills, weatherization.  Patient son advised that they had transportation but were concerned with long distance travel, particularly to [facility] when patient had to go there. LSW reviewed bug infestation, Patient son stated that this was for roaches and gnats, although BJ stated that the roaches were getting better.  LSW discussed working with landlord to address for professional treatment and other solutions.  BJ stated that they had tried multiple products for the gnats, but they were very bad.  BJ stated that they would not have the means to leave the home for the treatment time frame.  LSW discussed meeting and reviewing resource guide following week at appointment, patient agreeable.  LSW to access 211 and Findhelp for additional resource options.  LSW to continue to follow and assist as needed.       Buster Schueller, [place]  [phone] exct [phone]

## 2023-10-21 ENCOUNTER — Encounter (HOSPITAL_COMMUNITY): Payer: Self-pay | Admitting: HEMATOLOGY-ONCOLOGY

## 2023-10-21 ENCOUNTER — Encounter (INDEPENDENT_AMBULATORY_CARE_PROVIDER_SITE_OTHER): Payer: Self-pay | Admitting: HEMATOLOGY-ONCOLOGY

## 2023-10-21 ENCOUNTER — Ambulatory Visit (HOSPITAL_COMMUNITY)

## 2023-10-21 NOTE — Nursing Note (Signed)
 Called patient and informed that Keytruda has been approved and she has been scheduled for f/u with Dr. Moses on 11/03/2023 at 1130 with first Keytruda infusion to follow. Patient requesting later appointment for same day; messaged registration and infusion staff and appointment moved to 1530 with Keytruda to follow at 1600. Patient verbalized agreement and understanding to treatment plan.

## 2023-10-23 ENCOUNTER — Ambulatory Visit (INDEPENDENT_AMBULATORY_CARE_PROVIDER_SITE_OTHER): Payer: Self-pay | Admitting: Surgery

## 2023-10-23 ENCOUNTER — Encounter (INDEPENDENT_AMBULATORY_CARE_PROVIDER_SITE_OTHER): Payer: Self-pay | Admitting: Surgery

## 2023-10-23 ENCOUNTER — Other Ambulatory Visit: Payer: Self-pay

## 2023-10-23 VITALS — BP 103/54 | HR 87 | Wt 189.0 lb

## 2023-10-23 DIAGNOSIS — Z931 Gastrostomy status: Secondary | ICD-10-CM

## 2023-10-23 DIAGNOSIS — I878 Other specified disorders of veins: Secondary | ICD-10-CM

## 2023-10-23 DIAGNOSIS — C649 Malignant neoplasm of unspecified kidney, except renal pelvis: Secondary | ICD-10-CM

## 2023-10-25 ENCOUNTER — Encounter (INDEPENDENT_AMBULATORY_CARE_PROVIDER_SITE_OTHER): Payer: Self-pay | Admitting: Surgery

## 2023-10-25 NOTE — Progress Notes (Signed)
 GENERAL SURGERY, [facility] GENERAL SURGERY  [address]  [place] [address]    History and Physical    Name: Kilani Joffe MRN:  [id number]   Date: 10/23/2023 DOB:  1970/12/26 (53 y.o.)              Reason for Visit: General (Port insertion )    History of Present Illness  Ms. Debono presents today for Port-A-Cath insertion and G-tube removal.  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    Negative blood thinner, diabetes      Review of the result(s) of each unique test:  Patient underwent diagnostic testing ( none) prior to this dates visit.  I have personally reviewed the results and that serves as a component of the medical decision making for this encounter       Review of prior external note(s) from each unique source:  Patients referral to this office including a recent assessment by the referring provider.  This was reviewed by me for this unique office visit for the indication and intent of the referral as well as any pertinent medical or surgical history relevant to the patients independent evaluation by me today.      Patient History  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 with chemo and radiation    Chest pain, unspecified type 07/17/2023    CPAP (continuous positive airway pressure) dependence     CVA (cerebrovascular accident)     mini during brain surgery per pt    Diabetes mellitus, type 2     Diabetes mellitus, type 2     Disorder of thyroid      Dyspnea, unspecified type 07/17/2023    Epileptic seizure (CMS HCC)     Essential hypertension     GERD (gastroesophageal reflux disease) 08/02/2023    History of kidney surgery 06/2023    Pulmonary embolism 07/16/2023    Sleep apnea          Past Surgical History:   Procedure Laterality Date    BRAIN SURGERY      HX CHOLECYSTECTOMY      HX HERNIA REPAIR      KIDNEY SURGERY Left     removal         Current Outpatient Medications    Medication Sig    atorvastatin  (LIPITOR) 20 mg Oral Tablet Take 1 Tablet (20 mg total) by mouth Every night    ergocalciferol , vitamin D2, (DRISDOL ) 1,250 mcg (50,000 unit) Oral Capsule Take 1 Capsule (50,000 Units total) by mouth Every Sunday    escitalopram  oxalate (LEXAPRO ) 20 mg Oral Tablet Take 1 Tablet (20 mg total) by mouth Daily    famotidine  (PEPCID ) 20 mg Oral Tablet Take 1 Tablet (20 mg total) by mouth Daily    FREESTYLE LANCETS 28 gauge Misc TEST ONCE DAILY    FREESTYLE LITE METER Kit EVERY DAY    FREESTYLE LITE STRIPS Strip TEST EVERY DAY    hydroCHLOROthiazide  (HYDRODIURIL ) 25 mg Oral Tablet Take 1 Tablet (25 mg total) by mouth Every morning    levothyroxine  (SYNTHROID ) 25 mcg Oral Tablet TAKE 1 TABLET ONCE A DAY IN THE MORNING 30 MINUTES BEFORE MEALS OR ANY OTHER MEDICATIONS    losartan  (COZAAR ) 100 mg Oral Tablet Take 1 Tablet (100 mg total) by mouth Daily    magnesium  Oxide 420 mg Oral  Tablet Take 1 Tablet (420 mg total) by mouth Daily    methocarbamoL  (ROBAXIN ) 500 mg Oral Tablet Take 1 Tablet (500 mg total) by mouth Four times a day as needed    ondansetron  (ZOFRAN  ODT) 4 mg Oral Tablet, Rapid Dissolve Take 1 Tablet (4 mg total) by mouth Every 6 hours as needed for Nausea/Vomiting Indications: prevent nausea and vomiting from cancer chemotherapy    oxyCODONE  (ROXICODONE ) 5 mg Oral Tablet Take 1 Tablet (5 mg total) by mouth Every 6 hours as needed for Pain    phenytoin  sodium extended release (DILANTIN ) 100 mg Oral Capsule TAKE 3 CAPSULES BY MOUTH TWICE A DAY     Allergies[1]  Family Medical History:       Problem Relation (Age of Onset)    Arthritis-osteo Mother    Breast Cancer Paternal Grandmother    Heart Disease Father    Hypertension (High Blood Pressure) Mother    No Known Problems Sister, Brother, Maternal Grandmother, Maternal Grandfather, Paternal Grandfather, Daughter, Son, Maternal Aunt, Maternal Uncle, Paternal Aunt, Paternal Uncle, Other            Social History[2]         Physical  Examination:  Vitals:    10/23/23 1423   BP: (!) 103/54   Pulse: 87   SpO2: 96%   Weight: 85.7 kg (189 lb)        General: appropriate for age. in no acute distress.    Vital signs are present above and have been reviewed by me     HEENT: Atraumatic, Normocephalic. PERRLA. EOMI. Nose clear. Throat clear    Lungs: Nonlabored breathing with symmetric expansion. Clear to auscultation bilaterally    Heart:Regular wth respect to rate and rythmn.    Abdomen:Soft. Nontender. Nondistended and benign.  G-tube in place    Extremities: Grossly normal. No major deformities     Neuro:  Grossly normal motor and sensory function    Psychiatric: Alert and oriented to person, place, and time. affect appropriate      Assessment and Plan  Port-A-Cath insertion, left side and G-tube removal scheduled for 10/30/2023    Risks, benefits, indications, complication of portacath/mediport insertion and G-tube removal were discussed including but not limited to bleeding, infection, pneumothorax, arterial injury, cardiac arrhythmia, catheter related infectious process, procedural discomfort, air embolus, and even procedural related death.  All questions answered with informed signed consent obtained.      Follow Up:  No follow-ups on file.      ICD-10-CM    1. Poor venous access  I87.8       2. Malignant neoplasm of kidney, unspecified laterality (CMS HCC)  C64.9       3. 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  If you have any questions or concerns regarding this encounter, please do not hesitate to contact me at your convenience.      This note may have been partially generated using MModal Fluency Direct system, and there may be some incorrect words, spellings, and punctuation that were not noted in checking the note before saving, though effort was made to avoid such errors.               [1]   Allergies  Allergen Reactions     Ace Inhibitors  Other reaction(s): Angioedema   [2]   Social History  Tobacco Use    Smoking status: Never    Smokeless tobacco: Never   Vaping Use    Vaping status: Never Used   Substance Use Topics    Alcohol  use: Never    Drug use: Never

## 2023-10-27 ENCOUNTER — Other Ambulatory Visit (INDEPENDENT_AMBULATORY_CARE_PROVIDER_SITE_OTHER): Payer: Self-pay | Admitting: HEMATOLOGY-ONCOLOGY

## 2023-10-29 ENCOUNTER — Ambulatory Visit (INDEPENDENT_AMBULATORY_CARE_PROVIDER_SITE_OTHER): Payer: Self-pay

## 2023-10-29 ENCOUNTER — Ambulatory Visit (INDEPENDENT_AMBULATORY_CARE_PROVIDER_SITE_OTHER): Payer: Self-pay | Admitting: HEMATOLOGY-ONCOLOGY

## 2023-10-29 ENCOUNTER — Other Ambulatory Visit (INDEPENDENT_AMBULATORY_CARE_PROVIDER_SITE_OTHER): Payer: Self-pay | Admitting: HEMATOLOGY-ONCOLOGY

## 2023-10-30 ENCOUNTER — Ambulatory Visit (HOSPITAL_COMMUNITY): Admitting: Surgery

## 2023-10-30 ENCOUNTER — Encounter (HOSPITAL_COMMUNITY)
Admission: RE | Disposition: A | Discharge status: Home / Self Care | Payer: Self-pay | Source: Ambulatory Visit | Attending: Surgery

## 2023-10-30 ENCOUNTER — Other Ambulatory Visit: Payer: Self-pay

## 2023-10-30 ENCOUNTER — Encounter (HOSPITAL_COMMUNITY): Payer: Self-pay | Admitting: Surgery

## 2023-10-30 ENCOUNTER — Ambulatory Visit
Admission: RE | Admit: 2023-10-30 | Discharge: 2023-10-30 | Disposition: A | Discharge status: Home / Self Care | Source: Ambulatory Visit | Attending: Surgery

## 2023-10-30 ENCOUNTER — Ambulatory Visit
Admission: RE | Admit: 2023-10-30 | Discharge: 2023-10-30 | Disposition: A | Discharge status: Home / Self Care | Source: Ambulatory Visit | Attending: Surgery | Admitting: Surgery

## 2023-10-30 ENCOUNTER — Ambulatory Visit (HOSPITAL_COMMUNITY)

## 2023-10-30 DIAGNOSIS — Z431 Encounter for attention to gastrostomy: Secondary | ICD-10-CM | POA: Insufficient documentation

## 2023-10-30 DIAGNOSIS — Z931 Gastrostomy status: Secondary | ICD-10-CM | POA: Insufficient documentation

## 2023-10-30 DIAGNOSIS — C642 Malignant neoplasm of left kidney, except renal pelvis: Secondary | ICD-10-CM | POA: Insufficient documentation

## 2023-10-30 DIAGNOSIS — C649 Malignant neoplasm of unspecified kidney, except renal pelvis: Secondary | ICD-10-CM | POA: Insufficient documentation

## 2023-10-30 DIAGNOSIS — E119 Type 2 diabetes mellitus without complications: Secondary | ICD-10-CM | POA: Insufficient documentation

## 2023-10-30 DIAGNOSIS — Z85841 Personal history of malignant neoplasm of brain: Secondary | ICD-10-CM | POA: Insufficient documentation

## 2023-10-30 DIAGNOSIS — Z452 Encounter for adjustment and management of vascular access device: Secondary | ICD-10-CM | POA: Insufficient documentation

## 2023-10-30 LAB — POC BLOOD GLUCOSE (RESULTS)
GLUCOSE, POC: 97 mg/dL (ref 70–100)
GLUCOSE, POC: 80 mg/dL (ref 70–100)

## 2023-10-30 SURGERY — CHANGE GASTROSTOMY TUBE
Anesthesia: General | Site: Chest | Wound class: Clean Wound: Uninfected operative wounds in which no inflammation occurred

## 2023-10-30 MED ORDER — ACETAMINOPHEN 325 MG TABLET
650.0000 mg | ORAL_TABLET | ORAL | Status: DC | PRN
Start: 2023-10-30 — End: 2023-10-30

## 2023-10-30 MED ORDER — LACTATED RINGERS INTRAVENOUS SOLUTION
INTRAVENOUS | Status: DC | PRN
Start: 2023-10-30 — End: 2023-10-30

## 2023-10-30 MED ORDER — ONDANSETRON HCL (PF) 4 MG/2 ML INJECTION SOLUTION
4.0000 mg | Freq: Four times a day (QID) | INTRAMUSCULAR | Status: DC | PRN
Start: 2023-10-30 — End: 2023-10-30

## 2023-10-30 MED ORDER — CEFAZOLIN 2 GRAM INTRAVENOUS SOLUTION
INTRAVENOUS | Status: AC
Start: 2023-10-30 — End: 2023-10-30
  Filled 2023-10-30: qty 14.71

## 2023-10-30 MED ORDER — ONDANSETRON 4 MG DISINTEGRATING TABLET: 4.0000 mg | ORAL_TABLET | Freq: Three times a day (TID) | ORAL | 0 refills | Status: DC | PRN

## 2023-10-30 MED ORDER — HEPARIN (PORCINE) 5,000 UNIT/ML INJECTION SOLUTION
INTRAMUSCULAR | Status: AC
Start: 2023-10-30 — End: 2023-10-30
  Filled 2023-10-30: qty 3

## 2023-10-30 MED ORDER — PROCHLORPERAZINE EDISYLATE 10 MG/2 ML (5 MG/ML) INJECTION SOLUTION
5.0000 mg | Freq: Once | INTRAMUSCULAR | Status: DC | PRN
Start: 2023-10-30 — End: 2023-10-30

## 2023-10-30 MED ORDER — FENTANYL (PF) 50 MCG/ML INJECTION WRAPPER
50.0000 ug | INJECTION | INTRAMUSCULAR | Status: DC | PRN
Start: 2023-10-30 — End: 2023-10-30

## 2023-10-30 MED ORDER — SODIUM CHLORIDE 0.9 % (FLUSH) INJECTION SYRINGE
3.0000 mL | INJECTION | INTRAMUSCULAR | Status: DC | PRN
Start: 2023-10-30 — End: 2023-10-30

## 2023-10-30 MED ORDER — ALBUTEROL SULFATE 2.5 MG/3 ML (0.083 %) SOLUTION FOR NEBULIZATION
2.5000 mg | INHALATION_SOLUTION | Freq: Once | RESPIRATORY_TRACT | Status: DC | PRN
Start: 2023-10-30 — End: 2023-10-30

## 2023-10-30 MED ORDER — HYDROMORPHONE 2 MG/ML INJECTION WRAPPER
0.2500 mg | INJECTION | INTRAMUSCULAR | Status: DC | PRN
Start: 2023-10-30 — End: 2023-10-30

## 2023-10-30 MED ORDER — FENTANYL (PF) 50 MCG/ML INJECTION WRAPPER
25.0000 ug | INJECTION | INTRAMUSCULAR | Status: DC | PRN
Start: 2023-10-30 — End: 2023-10-30

## 2023-10-30 MED ORDER — METOCLOPRAMIDE 5 MG/ML INJECTION SOLUTION
10.0000 mg | Freq: Four times a day (QID) | INTRAMUSCULAR | Status: DC | PRN
Start: 2023-10-30 — End: 2023-10-30

## 2023-10-30 MED ORDER — DEXTROSE 5% IN WATER (D5W) FLUSH BAG - 250 ML
INTRAVENOUS | Status: DC | PRN
Start: 2023-10-30 — End: 2023-10-30

## 2023-10-30 MED ORDER — IPRATROPIUM 0.5 MG-ALBUTEROL 3 MG (2.5 MG BASE)/3 ML NEBULIZATION SOLN
3.0000 mL | INHALATION_SOLUTION | Freq: Once | RESPIRATORY_TRACT | Status: DC | PRN
Start: 2023-10-30 — End: 2023-10-30

## 2023-10-30 MED ORDER — HYDROCODONE 5 MG-ACETAMINOPHEN 325 MG TABLET
1.0000 | ORAL_TABLET | ORAL | Status: DC | PRN
Start: 2023-10-30 — End: 2023-10-30

## 2023-10-30 MED ORDER — HYDROMORPHONE 2 MG/ML INJECTION WRAPPER
0.5000 mg | INJECTION | INTRAMUSCULAR | Status: DC | PRN
Start: 2023-10-30 — End: 2023-10-30

## 2023-10-30 MED ORDER — KETOROLAC 30 MG/ML (1 ML) INJECTION SOLUTION
15.0000 mg | Freq: Four times a day (QID) | INTRAMUSCULAR | Status: DC | PRN
Start: 2023-10-30 — End: 2023-10-30

## 2023-10-30 MED ORDER — ETHYL ALCOHOL 62 % TOPICAL SWAB
1.0000 | Freq: Once | CUTANEOUS | Status: AC
Start: 2023-10-30 — End: 2023-10-30
  Administered 2023-10-30: 1 via NASAL

## 2023-10-30 MED ORDER — TRAMADOL 50 MG TABLET
1.0000 | ORAL_TABLET | ORAL | 1 refills | Status: AC | PRN
Start: 2023-10-30 — End: ?

## 2023-10-30 MED ORDER — PROPOFOL 10 MG/ML IV BOLUS
INJECTION | Freq: Once | INTRAVENOUS | Status: DC | PRN
Start: 2023-10-30 — End: 2023-10-30
  Administered 2023-10-30: 50 mg via INTRAVENOUS

## 2023-10-30 MED ORDER — LIDOCAINE (PF) 100 MG/5 ML (2 %) INTRAVENOUS SYRINGE
INJECTION | Freq: Once | INTRAVENOUS | Status: DC | PRN
Start: 2023-10-30 — End: 2023-10-30
  Administered 2023-10-30: 50 mg via INTRAVENOUS

## 2023-10-30 MED ORDER — ONDANSETRON 4 MG DISINTEGRATING TABLET
4.0000 mg | ORAL_TABLET | Freq: Four times a day (QID) | ORAL | Status: DC | PRN
Start: 2023-10-30 — End: 2023-10-30

## 2023-10-30 MED ORDER — LACTATED RINGERS INTRAVENOUS SOLUTION
INTRAVENOUS | Status: DC
Start: 2023-10-30 — End: 2023-10-30

## 2023-10-30 MED ORDER — HEPARIN (PORCINE) 5,000 UNIT/ML INJECTION SOLUTION
Freq: Once | INTRAMUSCULAR | Status: DC | PRN
Start: 2023-10-30 — End: 2023-10-30
  Administered 2023-10-30: 5000 [IU] via INTRAMUSCULAR

## 2023-10-30 MED ORDER — HYDROMORPHONE 2 MG/ML INJECTION WRAPPER
0.2000 mg | INJECTION | INTRAMUSCULAR | Status: DC | PRN
Start: 2023-10-30 — End: 2023-10-30

## 2023-10-30 MED ORDER — SODIUM CHLORIDE 0.9% FLUSH BAG - 250 ML
INTRAVENOUS | Status: DC | PRN
Start: 2023-10-30 — End: 2023-10-30

## 2023-10-30 MED ORDER — BACITRACIN ZINC 500 UNIT-POLYMYXIN B 10,000 UNIT/GRAM TOPICAL OINTMENT
TOPICAL_OINTMENT | CUTANEOUS | Status: AC
Start: 2023-10-30 — End: 2023-10-30
  Filled 2023-10-30: qty 14.2

## 2023-10-30 MED ORDER — ONDANSETRON HCL (PF) 4 MG/2 ML INJECTION SOLUTION
4.0000 mg | Freq: Once | INTRAMUSCULAR | Status: DC | PRN
Start: 2023-10-30 — End: 2023-10-30

## 2023-10-30 MED ORDER — SODIUM CHLORIDE 0.9 % (FLUSH) INJECTION SYRINGE
3.0000 mL | INJECTION | Freq: Three times a day (TID) | INTRAMUSCULAR | Status: DC
Start: 2023-10-30 — End: 2023-10-30

## 2023-10-30 MED ORDER — ROPIVACAINE (PF) 2 MG/ML (0.2 %) INJECTION SOLUTION
INTRAMUSCULAR | Status: AC
Start: 2023-10-30 — End: 2023-10-30
  Filled 2023-10-30: qty 50

## 2023-10-30 MED ORDER — CEFAZOLIN 1 GRAM SOLUTION FOR INJECTION
Freq: Once | INTRAMUSCULAR | Status: DC | PRN
Start: 2023-10-30 — End: 2023-10-30
  Administered 2023-10-30: 2000 mg via INTRAVENOUS

## 2023-10-30 MED ORDER — ROPIVACAINE (PF) 2 MG/ML (0.2 %) INJECTION SOLUTION
Freq: Once | INTRAMUSCULAR | Status: DC | PRN
Start: 2023-10-30 — End: 2023-10-30
  Administered 2023-10-30: 30 mL via EPIDURAL

## 2023-10-30 MED ORDER — PROPOFOL 10 MG/ML INTRAVENOUS EMULSION
INTRAVENOUS | Status: DC | PRN
Start: 2023-10-30 — End: 2023-10-30
  Administered 2023-10-30: 0 ug/kg/min via INTRAVENOUS
  Administered 2023-10-30: 140 ug/kg/min via INTRAVENOUS

## 2023-10-30 MED ORDER — SODIUM CHLORIDE 0.9 % INTRAVENOUS SOLUTION
INTRAVENOUS | Status: AC
Start: 2023-10-30 — End: 2023-10-30
  Filled 2023-10-30: qty 50

## 2023-10-30 SURGICAL SUPPLY — 27 items
ADH LIQUID LF  WTPRF VIAL PREP NONSTAIN MASTISOL STYRAX GUM MASTIC ALC MTHY SLCYT STRL CLR NHZR 2/3 (WOUND CARE SUPPLY) ×2 IMPLANT
BLADE 15 2 END CBNSTL SURG STRL DISP (SURGICAL CUTTING SUPPLIES) ×4 IMPLANT
CLEANER INSTRUMENT PRE-KLENZ 13.5 OZ (MISCELLANEOUS PT CARE ITEMS) ×2 IMPLANT
COUNTER 20 CNT BLOCK ADH NEEDLE STRL LF  RD SHARP FOAM 15.75X11.5X14IN DISP (MED SURG SUPPLIES) ×2 IMPLANT
COVER 53X24IN MAYOSTAND PRXM STRL DISP EQP SMS LF (DRAPE/PACKS/SHEETS/OR TOWEL) ×2 IMPLANT
COVER EQP 90X60IN HVDTY BCK PAD FNFLD BLU STRL (DRAPE/PACKS/SHEETS/OR TOWEL) ×2 IMPLANT
DCNTR FLUID DISPENSR BAG BAJ DISP STRL LF  ASPT TRANSF (IV TUBING & ACCESSORIES) ×2 IMPLANT
DRAPE 76X55IN 3 QT HALYARD LF  STRL DISP SURG (DRAPE/PACKS/SHEETS/OR TOWEL) ×2 IMPLANT
DRESS 10X4IN POSTOP STRL MPLX BR FOAM DISP (WOUND CARE SUPPLY) ×2 IMPLANT
DRESS TRNSPR 4.75X4IN POLYUR ADH HYPOALL WTPRF TGDRM STD STRL LF (WOUND CARE SUPPLY) ×4 IMPLANT
DSCT 21CM LIGASURE EXACT 40D 20.6MM 19.8MM 4MM CURVE JAW LOW TEMP PROF SURG 21.6X2MM VLAB (SURGICAL CUTTING SUPPLIES) ×2 IMPLANT
GLOVE SURG 7 LF  PF BEAD CUF STRL CRM 11.8IN PROTEXIS PI PLISPRN THK9.1 MIL (GLOVES AND ACCESSORIES) ×6 IMPLANT
GOWN SURG LRG STD LGTH L3 HKLP CLSR RGLN SLEEVE TWL STRL LF  DISP GRN AERO BLU PRFRM FBRC (DRAPE/PACKS/SHEETS/OR TOWEL) ×2 IMPLANT
GOWN SURG XL STD LGTH L3 HKLP CLSR RGLN SLEEVE TWL STRL LF  DISP GRN AERO BLU PRFRM FBRC (DRAPE/PACKS/SHEETS/OR TOWEL) ×2 IMPLANT
LABEL MED CORRECT MED LABELING SYS 4 FLG 2 SHEET 24 PRPRNT STRL (MED SURG SUPPLIES) ×2 IMPLANT
NEEDLE HYPO  23GA 1.5IN TW PRCSNGL SS POLYPROP REG BVL LL HUB DEHP-FR TRQS STRL LF  DISP (MED SURG SUPPLIES) ×4 IMPLANT
PACK SURG UBR BCK TBL CVR ZN REINF MAYO STAND CVR STRL DISP 90X44IN 54X23IN LF (CUSTOM TRAYS & PACK) ×2 IMPLANT
PORT IMPL INFUS POWERPORT CLRVU ARGD SIL POLYUR 8FR 1 LUM LTWT INTMD STRL LF ×2 IMPLANT
SLEEVE COMPRESS MED KNEE LGTH KENDALL SCD SEQ NONST LF  DISP 21- IN DVT PE (MED SURG SUPPLIES) ×2 IMPLANT
SOL IV 0.9% NACL 1000ML STRL PRSV FR FLXB CONTAINR LF (MEDICATIONS/SOLUTIONS) ×2 IMPLANT
SPONGE GAUZE 4X4IN AV GZ CLU COTTON ABS NWVN POSTOP LF  STRL DISP (WOUND CARE SUPPLY) ×4 IMPLANT
SPONGE SURG 4X4IN 16 PLY XRY DETECT COTTON STRL LF  DISP (WOUND CARE SUPPLY) ×2 IMPLANT
STRIP 4X.5IN STRSTRP PLSTR REINF SKNCLS WHT STRL LF (WOUND CARE SUPPLY) ×2 IMPLANT
SUTURE 2-0 SH VICRYL+ 27IN VIOL BRD ANBCTRL COAT ABS (SUTURE/WOUND CLOSURE) ×2 IMPLANT
SUTURE 5-0 C-13 POLYSRB 30IN UNDYED BRD COAT ABS (SUTURE/WOUND CLOSURE) ×2 IMPLANT
SYRINGE LL 10ML LF  STRL GRAD N-PYRG DEHP-FR PVC FREE MED DISP (MED SURG SUPPLIES) ×4 IMPLANT
TOWEL 24X16IN COTTON BLU DISP SURG STRL LF (DRAPE/PACKS/SHEETS/OR TOWEL) ×4 IMPLANT

## 2023-10-30 NOTE — Nurses Notes (Signed)
 Patient transported to [facility]. Receiving nurse, B. Levonne, RN at bedside. Temp.- 97.58F, Heart rate-66 , SPO2- 99% RA, Blood pressure- 126/65. Dressings clean, dry and intact, reviewed with nurse. IV infusing. All questions answered at this time.

## 2023-10-30 NOTE — Anesthesia Postprocedure Evaluation (Signed)
 Anesthesia Post Op Evaluation    Patient: [name]  Procedure(s):  PORTACATH INSERTION, RIGHT CHEST; PERCUTANEOUS ENDOSCOPIC GASTROSTOMY TUBE REMOVAL  N/A    Last Vitals:Temperature: 36.4 C (97.6 F) (10/30/23 0957)  Heart Rate: 71 (10/30/23 1010)  BP (Non-Invasive): 129/70 (10/30/23 1010)  Respiratory Rate: (!) 22 (10/30/23 1010)  SpO2: 98 % (10/30/23 1010)    No notable events documented.    Patient is sufficiently recovered from the effects of anesthesia to participate in the evaluation and has returned to their pre-procedure level.  Patient location during evaluation: PACU       Patient participation: complete - patient participated  Level of consciousness: awake and alert and responsive to verbal stimuli  Multimodal Pain Management: Multimodal analgesia used between 6 hours prior to anesthesia start to PACU discharge  Pain score: 0  Pain management: adequate  Airway patency: patent    Anesthetic complications: no  Cardiovascular status: acceptable  Respiratory status: acceptable  Hydration status: acceptable  Patient post-procedure temperature: Pt Normothermic   PONV Status: Absent

## 2023-10-30 NOTE — H&P (Signed)
 [facility]  General Surgery  History and Physical    Date of Service:  10/30/2023  Elizabeth, [name], 53 y.o. female  Date of Admission:  10/30/2023  Date of Birth:  1970/07/17  PCP: Elizabeth Lamer, NP    Reason for admission:  Port-A-Cath insertion and G-tube removal    HPI:  Elizabeth [name] is a 53 y.o. White female who is admitted for Malignant neoplasm of kidney, unspecified laterality (CMS HCC) [C64.9] .      Elizabeth [name] presents today for Port-A-Cath insertion and G-tube removal.  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     Negative blood thinner, diabetes        Review of the result(s) of each unique test:  Patient underwent diagnostic testing ( none) prior to this dates visit.  I have personally reviewed the results and that serves as a component of the medical decision making for this encounter        Review of prior external note(s) from each unique source:  Patients referral to this office including a recent assessment by the referring provider.  This was reviewed by me for this unique office visit for the indication and intent of the referral as well as any pertinent medical or surgical history relevant to the patients independent evaluation by me today.    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 with chemo and radiation    Chest pain, unspecified type 07/17/2023    CPAP (continuous positive airway pressure) dependence     CVA (cerebrovascular accident)     mini during brain surgery per pt    Diabetes mellitus, type 2     Diabetes mellitus, type 2     Disorder of thyroid      Dyspnea, unspecified type 07/17/2023    Epileptic seizure (CMS HCC)     Essential hypertension     GERD (gastroesophageal reflux disease) 08/02/2023    History of kidney surgery 06/2023    Pulmonary embolism 07/16/2023    Sleep apnea       Past Surgical History:   Procedure Laterality Date    BRAIN SURGERY       GASTROJEJUNOSTOMY W/ JEJUNOSTOMY TUBE      HX CHOLECYSTECTOMY      HX HERNIA REPAIR      KIDNEY SURGERY Left     removal      Social History[1]    Family Medical History:       Problem Relation (Age of Onset)    Arthritis-osteo Mother    Breast Cancer Paternal Grandmother    Heart Disease Father    Hypertension (High Blood Pressure) Mother    No Known Problems Sister, Brother, Maternal Grandmother, Maternal Grandfather, Paternal Grandfather, Daughter, Son, Maternal Aunt, Maternal Uncle, Paternal Aunt, Paternal Uncle, Other           Medications Prior to Admission       Prescriptions    atorvastatin  (LIPITOR) 20 mg Oral Tablet    Take 1 Tablet (20 mg total) by mouth Every night    ergocalciferol , vitamin D2, (DRISDOL ) 1,250 mcg (50,000 unit) Oral Capsule    Take 1 Capsule (50,000 Units total) by mouth Every Sunday    escitalopram  oxalate (LEXAPRO ) 20 mg Oral Tablet    Take 1 Tablet (20 mg total) by mouth Daily    famotidine  (PEPCID ) 20 mg  Oral Tablet    Take 1 Tablet (20 mg total) by mouth Daily    FREESTYLE LANCETS 28 gauge Misc    TEST ONCE DAILY    FREESTYLE LITE METER Kit    EVERY DAY    FREESTYLE LITE STRIPS Strip    TEST EVERY DAY    hydroCHLOROthiazide  (HYDRODIURIL ) 25 mg Oral Tablet    Take 1 Tablet (25 mg total) by mouth Every morning    levothyroxine  (SYNTHROID ) 25 mcg Oral Tablet    TAKE 1 TABLET ONCE A DAY IN THE MORNING 30 MINUTES BEFORE MEALS OR ANY OTHER MEDICATIONS    losartan  (COZAAR ) 100 mg Oral Tablet    Take 1 Tablet (100 mg total) by mouth Daily    magnesium  Oxide 420 mg Oral Tablet    Take 1 Tablet (420 mg total) by mouth Daily    methocarbamoL  (ROBAXIN ) 500 mg Oral Tablet    Take 1 Tablet (500 mg total) by mouth Four times a day as needed    Patient not taking:  Reported on 10/30/2023    ondansetron  (ZOFRAN  ODT) 4 mg Oral Tablet, Rapid Dissolve    Take 1 Tablet (4 mg total) by mouth Every 6 hours as needed for Nausea/Vomiting Indications: prevent nausea and vomiting from cancer chemotherapy     oxyCODONE  (ROXICODONE ) 5 mg Oral Tablet    Take 1 Tablet (5 mg total) by mouth Every 6 hours as needed for Pain    phenytoin  sodium extended release (DILANTIN ) 100 mg Oral Capsule    TAKE 3 CAPSULES BY MOUTH TWICE A DAY           Allergies[2]       Patient Vitals for the past 24 hrs:   BP Temp Pulse Resp SpO2 Height Weight   10/30/23 0707 117/76 36.3 C (97.3 F) 70 18 96 % 1.575 m (5' 2) 83.9 kg (185 lb)          General: appropriate for age. in no acute distress.    Vital signs are present above and have been reviewed by me     HEENT: Atraumatic, Normocephalic. PERRLA, EOMI. Nose clear. Throat clear.    Lungs: Nonlabored breathing with symmetric expansion.  Clear to auscultation bilaterally    Heart:Regular wth respect to rate and rythmn.    Abdomen:Soft. Nontender. Nondistended and benign.  G-tube in place.    Extremities:  Grossly normal with good range of motion and no major deformities.    Neuro:  Grossly normal motor and sensory function. CN's II through XII intact.    Psychiatric: Alert and oriented to person, place, and time. affect appropriate    Laboratory Data:     Results for orders placed or performed during the hospital encounter of 10/30/23 (from the past 24 hours)   POC BLOOD GLUCOSE (RESULTS)   Result Value Ref Range    GLUCOSE, POC 97 70 - 100 mg/dl       Imaging Studies:    FLUORO C ARM IN OR < 60 MINS    (Results Pending)        Assessment/Plan:  Malignant neoplasm of kidney, unspecified laterality (CMS HCC) [C64.9]    Port-A-Cath insertion, left side and G-tube removal scheduled for 10/30/2023     Risks, benefits, indications, complication of portacath/mediport insertion and G-tube removal were discussed including but not limited to bleeding, infection, pneumothorax, arterial injury, cardiac arrhythmia, catheter related infectious process, procedural discomfort, air embolus, and even procedural related death.  All questions answered with  informed signed consent obtained.    This note was  partially created using voice recognition software and is inherently subject to errors including those of syntax and sound alike  substitutions which may escape proof reading. In such instances, original meaning may be extrapolated by contextual derivation.    Cortlan Dolin B Starnisha Batrez, MD, MBA, FACS         [1]   Social History  Tobacco Use    Smoking status: Never    Smokeless tobacco: Never   Vaping Use    Vaping status: Never Used   Substance Use Topics    Alcohol  use: Never    Drug use: Never   [2]   Allergies  Allergen Reactions    Ace Inhibitors      Other reaction(s): Angioedema

## 2023-10-30 NOTE — Anesthesia Preprocedure Evaluation (Signed)
 ANESTHESIA PRE-OP EVALUATION  Planned Procedure: PORTACATH INSERTION ; PERCUTANEOUS ENDOSCOPIC GASTROSTOMY TUBE REMOVAL (Chest)  N/A (Chest)  Review of Systems     anesthesia history negative     patient summary reviewed  nursing notes reviewed        Pulmonary   sleep apnea,   Cardiovascular    Hypertension and ECG reviewed ,No peripheral edema,  Exercise Tolerance: > or = 4 METS        GI/Hepatic/Renal    hiatal hernia        Endo/Other    anemia,      Neuro/Psych/MS    Left sided facial droop and weakness in RLE, CVA     Cancer  CA,   breast cancer,                   Physical Assessment      Airway       Mallampati: III    TM distance: <3 FB    Neck ROM: full  Mouth Opening: good.            Dental           (+) missing           Pulmonary      (+) decreased breath sounds present        Cardiovascular    Rhythm: regular  Rate: Normal  (-) no friction rub, carotid bruit is not present, no peripheral edema and no murmur     Other findings  No loose teeth          Plan  ASA 3     Planned anesthesia type: general     general anesthesia with endotracheal tube intubation      PONV Plan:  I plan to administer pharmcologic prophalaxis antiemetics  POV PLAN:   plan for postoperative opioid use            Intravenous induction     Anesthesia issues/risks discussed are: Difficult Airway, Aspiration, Sore Throat, Blood Loss, Cardiac Events/MI, PONV, Art Line Placement, Post-op Agitation/Tantrum, Post-op Pain Management, Post-op Intubation/Ventilation, Dental Injuries and Eye /Visual Loss.  Anesthetic plan and risks discussed with patient  signed consent obtained      Use of blood products discussed with patient who consented to blood products.      Patient's NPO status is appropriate for Anesthesia.  NPO Status: Full stomach precautions.         Plan discussed with CRNA.

## 2023-10-30 NOTE — OR Surgeon (Signed)
 [facility]      Patient Name: Elizabeth [name], Elizabeth [name] [facility] Number: [id number]  Date of Service: 10/30/2023   Date of Birth: 1970-03-18      Pre-Operative Diagnosis: Malignant neoplasm of kidney, unspecified laterality (CMS HCC) [C64.9]     Post-Operative Diagnosis: Malignant neoplasm of kidney, unspecified laterality  POOR VENOUS ACCESS  NO LONGER NEEDS GASTROSTOMY TUBE    Procedure(s)/Description:  PORTACATH INSERTION, RIGHT CHEST; PERCUTANEOUS ENDOSCOPIC GASTROSTOMY TUBE REMOVAL: 56237 (CPT)    N/A: 63438 (CPT)       Attending Surgeon: Amaryllis KATHEE Denise, MD     Anesthesia:  Anesthesiologist: Juanice Penny, MD  CRNA: Effie Bench, CRNA    Anesthesia Type: .Monitor Anesthesia Care     First Assist: None     Estimated Blood Loss:  none    The patient was brought in to the operating room, placed on the table in the supine position. The chest was then prepped and draped in a sterile manner and the infraclavicular region was infiltrated with local anesthetic. Then after adequate anesthesia the right subclavian vein was entered and via the modified Seldinger technique the guide wire was inserted and confirmed to be in good position under fluoroscopy. I had attempted left sided insertion however the guidewire was not able to be passed to the SVC.The chest was then infiltrated with local anesthetic and a subcutaneous pocket was made. A curved tunneling device was used to make a subcutaneous tunnel, connecting the subcutaneous pocket with the guide wire insertion site. The catheter was then passed through the subcutaneous tunnel and then the introducer and sheath were passed over the guide wire and then the guide wire was removed, as well as the introducer, leaving the sheath in place. The tip of the catheter was inserted into the sheath, into the superior vena cava and then under fluoroscopic confirmation good position of the tip of the catheter was identified. The catheter was then cut, such that its  length would approximate the level of the subcutaneous pocket. It was then attached to the subcutaneous port and sutured to the underlying fascia with 2-0 Vicryl sutures. Hemostasis was well obtained. The catheter was aspirated with good blood return and the catheter was flushed. The subcutaneous pocket was then closed with a running 5-0 Vicryl suture in a buried subcuticular fashion. The superior incision at the guide wire entry site was closed with a single interrupted 5-0 Vicryl suture in a buried subcuticular fashion. The area was clean and dry and sterile dressing was applied over the incision.     The Gtube was removed intact without any difficulty and the site dressed with gauze.  Patient tolerated the procedure well. No complications encountered    Etheline Geppert B. Kristoffer Bala, MD, MBA, FACS  [facility] -General Surgery

## 2023-10-30 NOTE — Anesthesia Transfer of Care (Signed)
 ANESTHESIA TRANSFER OF CARE   Elizabeth [name] is a 53 y.o. ,female, Weight: 83.9 kg (185 lb)   had Procedure(s):  PORTACATH INSERTION, RIGHT CHEST; PERCUTANEOUS ENDOSCOPIC GASTROSTOMY TUBE REMOVAL  N/A  performed  10/30/23   Primary Service: Amaryllis KATHEE Denise, MD    Past Medical History:   Diagnosis Date   . Anemia    . Cancer (CMS [facility])     brain tumor 09/2013 with chemo and radiation   . Chest pain, unspecified type 07/17/2023   . CPAP (continuous positive airway pressure) dependence    . CVA (cerebrovascular accident)     mini during brain surgery per pt   . Diabetes mellitus, type 2    . Diabetes mellitus, type 2    . Disorder of thyroid     . Dyspnea, unspecified type 07/17/2023   . Epileptic seizure (CMS HCC)    . Essential hypertension    . GERD (gastroesophageal reflux disease) 08/02/2023   . History of kidney surgery 06/2023   . Pulmonary embolism 07/16/2023   . Sleep apnea       Allergy History as of 10/30/23       ACE INHIBITORS         Noted Status Severity Type Reaction    04/05/21 1524 Teresa Pulling, [place] 09/10/13 Active       Comments: Other reaction(s): Angioedema                   I completed my transfer of care / handoff to the receiving personnel during which we discussed:  Access, Airway, All key/critical aspects of case discussed, Analgesia, Antibiotics, Expectation of post procedure, Fluids/Product, Gave opportunity for questions and acknowledgement of understanding, Labs and PMHx  Report given to: Sherre Lukes, RN    Post Location: PACU                                                           Last OR Temp: Temperature: 36.4 C (97.6 F)  ABG:  PH (ARTERIAL)   Date Value Ref Range Status   07/26/2023 7.32 (L) 7.35 - 7.45 Final     PH (T)   Date Value Ref Range Status   06/19/2023 7.31 (L) 7.32 - 7.43 Final     PCO2 (ARTERIAL)   Date Value Ref Range Status   07/26/2023 44 35 - 45 mm/Hg Final     PCO2 (VENOUS)   Date Value Ref Range Status   06/19/2023 39 (L) 41 - 51 mm/Hg Final     PO2 (ARTERIAL)   Date  Value Ref Range Status   07/26/2023 80 (L) 83 - 108 mm/Hg Final     PO2 (VENOUS)   Date Value Ref Range Status   06/19/2023 131 35 - 50 mm/Hg Final     SODIUM   Date Value Ref Range Status   07/26/2023 138 136 - 145 mmol/L Final     POTASSIUM   Date Value Ref Range Status   08/11/2023 4.1 3.5 - 5.1 mmol/L Final     KETONES   Date Value Ref Range Status   07/29/2023 40 (A) Negative mg/dL Final     WHOLE BLOOD POTASSIUM   Date Value Ref Range Status   07/26/2023 3.9 3.5 - 5.1 mmol/L Final     CHLORIDE  Date Value Ref Range Status   07/26/2023 105 98 - 107 mmol/L Final     CALCIUM    Date Value Ref Range Status   08/11/2023 9.1 8.6 - 10.2 mg/dL Final     Comment:     Gadolinium-containing contrast can interfere with calcium  measurement.       Calculated P Axis   Date Value Ref Range Status   07/26/2023 64 degrees Final     Calculated R Axis   Date Value Ref Range Status   07/26/2023 92 degrees Final     Calculated T Axis   Date Value Ref Range Status   07/26/2023 -80 degrees Final     IONIZED CALCIUM    Date Value Ref Range Status   07/26/2023 1.29 1.15 - 1.33 mmol/L Final     LACTATE   Date Value Ref Range Status   07/26/2023 1.9 <=1.9 mmol/L Final     HEMOGLOBIN   Date Value Ref Range Status   07/26/2023 13.0 12.0 - 18.0 g/dL Final     OXYHEMOGLOBIN   Date Value Ref Range Status   07/26/2023 94.1 90.0 - 95.0 % Final     CARBOXYHEMOGLOBIN   Date Value Ref Range Status   07/26/2023 2.1 <=3.0 % Final     MET-HEMOGLOBIN   Date Value Ref Range Status   07/26/2023 1.0 <=1.5 % Final     BASE EXCESS (ARTERIAL)   Date Value Ref Range Status   07/26/2023 -1.5 -3.0 - 1.0 mmol/L Final     BASE DEFICIT   Date Value Ref Range Status   07/26/2023 3.4 (H) 0.0 - 3.0 mmol/L Final   06/19/2023 6.2 (H) 0.0 - 3.0 mmol/L Final     BICARBONATE (ARTERIAL)   Date Value Ref Range Status   07/26/2023 22.2 21.0 - 28.0 mmol/L Final     BICARBONATE (VENOUS)   Date Value Ref Range Status   06/19/2023 20.1 (L) 22.0 - 29.0 mmol/L Final      TEMPERATURE, COMP   Date Value Ref Range Status   06/19/2023 37.0 15.0 - 40.0 C Final     %FIO2 (VENOUS)   Date Value Ref Range Status   06/19/2023 61.0 % Final     Airway:* No LDAs found *  Blood pressure (!) 131/58, pulse 74, temperature 36.4 C (97.6 F), resp. rate 16, height 1.575 m (5' 2), weight 83.9 kg (185 lb), SpO2 98%.

## 2023-10-31 ENCOUNTER — Telehealth (INDEPENDENT_AMBULATORY_CARE_PROVIDER_SITE_OTHER): Payer: Self-pay | Admitting: Surgery

## 2023-11-03 ENCOUNTER — Other Ambulatory Visit: Payer: Self-pay

## 2023-11-03 ENCOUNTER — Ambulatory Visit (HOSPITAL_BASED_OUTPATIENT_CLINIC_OR_DEPARTMENT_OTHER): Payer: Self-pay | Admitting: HEMATOLOGY-ONCOLOGY

## 2023-11-03 ENCOUNTER — Encounter (INDEPENDENT_AMBULATORY_CARE_PROVIDER_SITE_OTHER): Payer: Self-pay | Admitting: HEMATOLOGY-ONCOLOGY

## 2023-11-03 ENCOUNTER — Ambulatory Visit
Admission: RE | Admit: 2023-11-03 | Discharge: 2023-11-03 | Disposition: A | Discharge status: Home / Self Care | Source: Ambulatory Visit | Attending: HEMATOLOGY-ONCOLOGY | Admitting: HEMATOLOGY-ONCOLOGY

## 2023-11-03 ENCOUNTER — Ambulatory Visit (INDEPENDENT_AMBULATORY_CARE_PROVIDER_SITE_OTHER)
Admission: RE | Admit: 2023-11-03 | Discharge: 2023-11-03 | Disposition: A | Payer: Self-pay | Source: Ambulatory Visit | Attending: HEMATOLOGY-ONCOLOGY

## 2023-11-03 ENCOUNTER — Encounter (HOSPITAL_COMMUNITY): Payer: Self-pay

## 2023-11-03 ENCOUNTER — Ambulatory Visit (INDEPENDENT_AMBULATORY_CARE_PROVIDER_SITE_OTHER): Payer: Self-pay

## 2023-11-03 VITALS — BP 122/73 | HR 79 | Temp 97.7°F | Ht 62.0 in | Wt 193.6 lb

## 2023-11-03 VITALS — BP 126/77 | HR 71 | Temp 97.7°F | Resp 18

## 2023-11-03 DIAGNOSIS — I69820 Aphasia following other cerebrovascular disease: Secondary | ICD-10-CM | POA: Insufficient documentation

## 2023-11-03 DIAGNOSIS — Z9889 Other specified postprocedural states: Secondary | ICD-10-CM | POA: Insufficient documentation

## 2023-11-03 DIAGNOSIS — Z5112 Encounter for antineoplastic immunotherapy: Secondary | ICD-10-CM | POA: Insufficient documentation

## 2023-11-03 DIAGNOSIS — Z79620 Long term (current) use of immunosuppressive biologic: Secondary | ICD-10-CM | POA: Insufficient documentation

## 2023-11-03 DIAGNOSIS — C642 Malignant neoplasm of left kidney, except renal pelvis: Secondary | ICD-10-CM

## 2023-11-03 DIAGNOSIS — Z9221 Personal history of antineoplastic chemotherapy: Secondary | ICD-10-CM | POA: Insufficient documentation

## 2023-11-03 DIAGNOSIS — I10 Essential (primary) hypertension: Secondary | ICD-10-CM | POA: Insufficient documentation

## 2023-11-03 DIAGNOSIS — Z85841 Personal history of malignant neoplasm of brain: Secondary | ICD-10-CM | POA: Insufficient documentation

## 2023-11-03 DIAGNOSIS — Z923 Personal history of irradiation: Secondary | ICD-10-CM | POA: Insufficient documentation

## 2023-11-03 LAB — CBC WITH DIFF
BASOPHIL #: 0.1 x10ˆ3/uL (ref 0.00–0.10)
BASOPHIL %: 1 % (ref 0–1)
EOSINOPHIL #: 0.2 x10ˆ3/uL (ref 0.00–0.50)
EOSINOPHIL %: 3 % (ref 1–7)
HCT: 38.2 % (ref 31.2–41.9)
HGB: 13.1 g/dL (ref 10.9–14.3)
LYMPHOCYTE #: 2.6 x10ˆ3/uL (ref 1.10–3.10)
LYMPHOCYTE %: 37 % (ref 16–46)
MCH: 30.6 pg (ref 24.7–32.8)
MCHC: 34.4 g/dL (ref 32.3–35.6)
MCV: 88.9 fL (ref 75.5–95.3)
MONOCYTE #: 0.6 x10ˆ3/uL (ref 0.20–0.90)
MONOCYTE %: 8 % (ref 4–11)
MPV: 7.1 fL — ABNORMAL LOW (ref 7.9–10.8)
NEUTROPHIL #: 3.8 x10ˆ3/uL (ref 1.90–8.20)
NEUTROPHIL %: 52 % (ref 43–77)
PLATELETS: 248 x10ˆ3/uL (ref 140–440)
RBC: 4.29 x10ˆ6/uL (ref 3.63–4.92)
RDW: 13.8 % (ref 12.3–17.7)
WBC: 7.2 x10ˆ3/uL (ref 3.8–11.8)

## 2023-11-03 LAB — COMPREHENSIVE METABOLIC PANEL, NON-FASTING
ALBUMIN/GLOBULIN RATIO: 1.7 — ABNORMAL HIGH (ref 0.8–1.4)
ALBUMIN: 4.5 g/dL (ref 3.5–5.7)
ALKALINE PHOSPHATASE: 123 U/L — ABNORMAL HIGH (ref 34–104)
ALT (SGPT): 19 U/L (ref 7–52)
ANION GAP: 7 mmol/L (ref 4–13)
AST (SGOT): 14 U/L (ref 13–39)
BILIRUBIN TOTAL: 0.3 mg/dL (ref 0.3–1.0)
BUN/CREA RATIO: 23 — ABNORMAL HIGH (ref 6–22)
BUN: 17 mg/dL (ref 7–25)
CALCIUM, CORRECTED: 8.9 mg/dL (ref 8.9–10.8)
CALCIUM: 9.3 mg/dL (ref 8.6–10.3)
CHLORIDE: 103 mmol/L (ref 98–107)
CO2 TOTAL: 30 mmol/L (ref 21–31)
CREATININE: 0.73 mg/dL (ref 0.60–1.30)
ESTIMATED GFR: 98 mL/min/1.73mˆ2 (ref >59)
GLOBULIN: 2.7 (ref 2.0–3.5)
GLUCOSE: 133 mg/dL — ABNORMAL HIGH (ref 74–109)
OSMOLALITY, CALCULATED: 283 mosm/kg (ref 270–290)
POTASSIUM: 3.9 mmol/L (ref 3.5–5.1)
PROTEIN TOTAL: 7.2 g/dL (ref 6.4–8.9)
SODIUM: 140 mmol/L (ref 136–145)

## 2023-11-03 LAB — THYROID STIMULATING HORMONE WITH FREE T4 REFLEX: TSH: 2.937 u[IU]/mL (ref 0.450–5.330)

## 2023-11-03 LAB — MAGNESIUM: MAGNESIUM: 2 mg/dL (ref 1.9–2.7)

## 2023-11-03 MED ORDER — EPINEPHRINE 1 MG/ML (1 ML) INJECTION SOLUTION
0.3000 mg | Freq: Once | INTRAMUSCULAR | Status: DC | PRN
Start: 2023-11-03 — End: 2023-11-04

## 2023-11-03 MED ORDER — ALBUTEROL SULFATE HFA 90 MCG/ACTUATION AEROSOL INHALER - RN
2.0000 | Freq: Once | RESPIRATORY_TRACT | Status: DC | PRN
Start: 2023-11-03 — End: 2023-11-04

## 2023-11-03 MED ORDER — DIPHENHYDRAMINE 50 MG/ML INJECTION SOLUTION
25.0000 mg | Freq: Once | INTRAMUSCULAR | Status: DC | PRN
Start: 2023-11-03 — End: 2023-11-04

## 2023-11-03 MED ORDER — SODIUM CHLORIDE 0.9 % IV BOLUS
500.0000 mL | INJECTION | Freq: Once | Status: DC | PRN
Start: 2023-11-03 — End: 2023-11-04

## 2023-11-03 MED ORDER — SODIUM CHLORIDE 0.9% FLUSH BAG - 250 ML
INTRAVENOUS | Status: DC | PRN
Start: 2023-11-03 — End: 2023-11-04

## 2023-11-03 MED ORDER — ONDANSETRON HCL 8 MG TABLET
8.0000 mg | ORAL_TABLET | Freq: Once | ORAL | Status: DC | PRN
Start: 2023-11-03 — End: 2023-11-04

## 2023-11-03 MED ORDER — HYDROCORTISONE SOD SUCCINATE 100 MG/2 ML VIAL WRAPPER
100.0000 mg | Freq: Once | INTRAMUSCULAR | Status: DC | PRN
Start: 2023-11-03 — End: 2023-11-04

## 2023-11-03 MED ORDER — ALBUTEROL SULFATE 2.5 MG/3 ML (0.083 %) SOLUTION FOR NEBULIZATION
2.5000 mg | INHALATION_SOLUTION | Freq: Once | RESPIRATORY_TRACT | Status: DC | PRN
Start: 2023-11-03 — End: 2023-11-04

## 2023-11-03 MED ORDER — DEXTROSE 5% IN WATER (D5W) FLUSH BAG - 250 ML
INTRAVENOUS | Status: DC | PRN
Start: 2023-11-03 — End: 2023-11-04

## 2023-11-03 MED ORDER — SODIUM CHLORIDE 0.9 % INTRAVENOUS SOLUTION
200.0000 mg | Freq: Once | INTRAVENOUS | Status: AC
Start: 2023-11-03 — End: 2023-11-03
  Administered 2023-11-03: 200 mg via INTRAVENOUS
  Administered 2023-11-03: 0 mg via INTRAVENOUS
  Filled 2023-11-03: qty 8

## 2023-11-03 MED ORDER — ONDANSETRON HCL (PF) 4 MG/2 ML INJECTION SOLUTION
8.0000 mg | Freq: Once | INTRAMUSCULAR | Status: DC | PRN
Start: 2023-11-03 — End: 2023-11-04

## 2023-11-03 MED ORDER — MEPERIDINE (PF) 25 MG/ML INJECTION SOLUTION
12.5000 mg | Freq: Once | INTRAMUSCULAR | Status: DC | PRN
Start: 2023-11-03 — End: 2023-11-04

## 2023-11-03 MED ORDER — DIPHENHYDRAMINE 50 MG/ML INJECTION SOLUTION
50.0000 mg | Freq: Once | INTRAMUSCULAR | Status: DC | PRN
Start: 2023-11-03 — End: 2023-11-04

## 2023-11-03 MED ORDER — FAMOTIDINE (PF) 20 MG/2 ML INTRAVENOUS SOLUTION
20.0000 mg | Freq: Once | INTRAVENOUS | Status: DC | PRN
Start: 2023-11-03 — End: 2023-11-04

## 2023-11-03 NOTE — Nursing Note (Signed)
 Patient to get keytruda today. All questions answered. Went over pathology again. Patient to get treatment today. Return in 3 weeks.

## 2023-11-03 NOTE — Nurses Notes (Signed)
 1636 Pt to OP Onc unit via wheelchair with son to assist. Labs drawn at Oncology Clinic prior to appt with Dr.Mackey. VS obtained. Immunotherapy Flow Sheet complete. Verbal and written information provided to pt. Rexene Lipps, RN  Patient Assessment/Symptom Management Patient Has No MD Appointment Today   Key: (+) Symptom present           (-)  Symptom not present If Symptom is Positive(+) a Nursing Note is required   Edema -   Uncontrolled Nausea -   Vomiting -   Inability to eat/drink -   Mouth Sores -   Diarrhea -   Constipation (? Last BM) -   Fatigue that interferes with ADL's -   Numbness/Tingling -change -   Other -   Fever/Signs & Symptoms of infection -   Nurse Initials TL RN   1651 Port accessed with ease. Excellent blood return and flushes easily. Rexene Lipps, RN  [phone] Keytruda 200 mg IV infusion started. Rexene Lipps, RN  1734 Keytruda infusion completed. VS obtained. Rexene Lipps, RN  [phone] 30 min observation period completed. No adverse reaction noted. Port flushed with 30 ml NS. Olean d/c'ed. Site clear. Drsg applied. Pt left OP Onc unit via wheelchair. No adverse reaction noted. Rexene Lipps, RN

## 2023-11-03 NOTE — Progress Notes (Signed)
 Department of Hematology/Oncology  Progress Note   Name: Elizabeth [name]  FMW:[id number]  Date of Birth: 12/22/1970  Encounter Date: 11/03/2023    REFERRING PROVIDER:  Pcp, No  No address on file    REASON FOR OFFICE VISIT:  Renal Cell Cancer     HISTORY OF PRESENT ILLNESS:  Elizabeth [name] is a 53 y.o. 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  She was treated with surgery followed by temozolomide plus radiation.  The combination therapy was finished in October 2015.    Clinically, she is been doing well since that time.  She does have some aphasia and word-finding difficulty.    11/03/2023: The patient is here for follow up of renal cell carcinoma.  The lesion was T IIIb, with all margins negative.  No lymph nodes were seen in the sample.    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    Chest pain, unspecified type 07/17/2023    CPAP (continuous positive airway pressure) dependence     CVA (cerebrovascular accident)     mini during brain surgery per pt    Diabetes mellitus, type 2     Diabetes mellitus, type 2     Disorder of thyroid      Dyspnea, unspecified type 07/17/2023    Epileptic seizure (CMS HCC)     Essential hypertension     GERD (gastroesophageal reflux disease) 08/02/2023    History of kidney surgery 06/2023    Pulmonary embolism 07/16/2023    Sleep apnea          Past Surgical History:   Procedure Laterality Date    BRAIN SURGERY      GASTROJEJUNOSTOMY W/ JEJUNOSTOMY TUBE      HX CHOLECYSTECTOMY      HX HERNIA REPAIR      KIDNEY SURGERY Left     removal         Social History     Socioeconomic History    Marital status: Divorced     Spouse name: Not on file    Number of children: Not on file    Years of education: Not on file    Highest education level: Not on file    Occupational History    Not on file   Tobacco Use    Smoking status: Never    Smokeless tobacco: Never   Vaping Use    Vaping status: Never Used   Substance and Sexual Activity    Alcohol  use: Never    Drug use: Never    Sexual activity: Not on file   Other Topics Concern    Ability to Walk 1 Flight of Steps without SOB/CP Yes    Routine Exercise No    Ability to Walk 2 Flight of Steps without SOB/CP No    Unable to Ambulate Not Asked    Total Care Not Asked    Ability To Do Own ADL's Yes    Uses Walker Not Asked    Other Activity Level Not Asked    Uses Cane Not Asked   Social History Narrative    Not on file     Social Determinants of Health     Financial Resource Strain: Low  Risk (07/24/2023)    Financial Resource Strain     SDOH Financial: No   Transportation Needs: High Risk (07/24/2023)    Transportation Needs     SDOH Transportation: Yes, it has kept me from medical appointments or from getting my medications   Social Connections: Low Risk (07/24/2023)    Social Connections     SDOH Social Isolation: 5 or more times a week   Intimate Partner Violence: Not on file   Housing Stability: Low Risk (07/24/2023)    Housing Stability     SDOH Housing Situation: I have housing.     SDOH Housing Worry: No     Family Medical History:       Problem Relation (Age of Onset)    Arthritis-osteo Mother    Breast Cancer Paternal Grandmother    Heart Disease Father    Hypertension (High Blood Pressure) Mother    No Known Problems Sister, Brother, Maternal Grandmother, Maternal Grandfather, Paternal Grandfather, Daughter, Son, Maternal Aunt, Maternal Uncle, Paternal Aunt, Paternal Uncle, Other            Current Outpatient Medications   Medication Sig    atorvastatin  (LIPITOR) 20 mg Oral Tablet Take 1 Tablet (20 mg total) by mouth Every night    ergocalciferol , vitamin D2, (DRISDOL ) 1,250 mcg (50,000 unit) Oral Capsule Take 1 Capsule (50,000 Units total) by mouth Every Sunday    escitalopram  oxalate (LEXAPRO ) 20 mg Oral Tablet  Take 1 Tablet (20 mg total) by mouth Daily    famotidine  (PEPCID ) 20 mg Oral Tablet Take 1 Tablet (20 mg total) by mouth Daily    FREESTYLE LANCETS 28 gauge Misc TEST ONCE DAILY    FREESTYLE LITE METER Kit EVERY DAY    FREESTYLE LITE STRIPS Strip TEST EVERY DAY    hydroCHLOROthiazide  (HYDRODIURIL ) 25 mg Oral Tablet Take 1 Tablet (25 mg total) by mouth Every morning    levothyroxine  (SYNTHROID ) 25 mcg Oral Tablet TAKE 1 TABLET ONCE A DAY IN THE MORNING 30 MINUTES BEFORE MEALS OR ANY OTHER MEDICATIONS    losartan  (COZAAR ) 100 mg Oral Tablet Take 1 Tablet (100 mg total) by mouth Daily    magnesium  Oxide 420 mg Oral Tablet Take 1 Tablet (420 mg total) by mouth Daily    methocarbamoL  (ROBAXIN ) 500 mg Oral Tablet Take 1 Tablet (500 mg total) by mouth Four times a day as needed    ondansetron  (ZOFRAN  ODT) 4 mg Oral Tablet, Rapid Dissolve Take 1 Tablet (4 mg total) by mouth Every 6 hours as needed for Nausea/Vomiting Indications: prevent nausea and vomiting from cancer chemotherapy    ondansetron  (ZOFRAN  ODT) 4 mg Oral Tablet, Rapid Dissolve Take 1 Tablet (4 mg total) by mouth Every 8 hours as needed for Nausea/Vomiting    oxyCODONE  (ROXICODONE ) 5 mg Oral Tablet Take 1 Tablet (5 mg total) by mouth Every 6 hours as needed for Pain    phenytoin  sodium extended release (DILANTIN ) 100 mg Oral Capsule TAKE 3 CAPSULES BY MOUTH TWICE A DAY    traMADoL (ULTRAM) 50 mg Oral Tablet Take 1 Tablet (50 mg total) by mouth Every 4 hours as needed for Pain     Allergies   Allergen Reactions    Ace Inhibitors      Other reaction(s): Angioedema       PHYSICAL EXAM:  Most Recent Vitals    Flowsheet Row Office Visit from 07/05/2021 in Hematology/Oncology, [facility]   Temperature 36.8 C (98.3 F) filed at... 07/05/2021  1548   Heart Rate 76 filed at... 07/05/2021 1548   Respiratory Rate --   BP (Non-Invasive) 158/70 filed at... 07/05/2021 1548   SpO2 --   Height 1.575 m (5' 2) filed at... 07/05/2021 1548   Weight 94.5 kg (208  lb 4.8 oz) filed at... 07/05/2021 1548   BMI (Calculated) 38.18 filed at... 07/05/2021 1548   BSA (Calculated) 2.03 filed at... 07/05/2021 1548      ECOG Status: (1) Restricted in physically strenuous activity, ambulatory and able to do work of light nature   Physical Exam  Constitutional:       General: She is not in acute distress.     Appearance: Normal appearance.   Eyes:      Extraocular Movements: Extraocular movements intact.   Cardiovascular:      Rate and Rhythm: Normal rate and regular rhythm.   Pulmonary:      Effort: Pulmonary effort is normal.   Abdominal:      General: Abdomen is flat.      Palpations: Abdomen is soft.   Musculoskeletal:         General: Normal range of motion.      Cervical back: Normal range of motion.   Skin:     General: Skin is warm and dry.   Neurological:      General: No focal deficit present.      Mental Status: She is alert.   Psychiatric:         Mood and Affect: Mood normal.         DIAGNOSTIC DATA:  No results found for this or any previous visit (from the past 82479 hours).    LABS:   CBC  Diff   Lab Results   Component Value Date/Time    WBC 7.2 11/03/2023 03:37 PM    HGB 13.1 11/03/2023 03:37 PM    HCT 38.2 11/03/2023 03:37 PM    PLTCNT 248 11/03/2023 03:37 PM    RBC 4.29 11/03/2023 03:37 PM    MCV 88.9 11/03/2023 03:37 PM    MCHC 34.4 11/03/2023 03:37 PM    MCH 30.6 11/03/2023 03:37 PM    RDW 13.8 11/03/2023 03:37 PM    MPV 7.1 (L) 11/03/2023 03:37 PM    Lab Results   Component Value Date/Time    PMNS 52 11/03/2023 03:37 PM    LYMPHOCYTES 37 11/03/2023 03:37 PM    EOSINOPHIL 3 11/03/2023 03:37 PM    MONOCYTES 8 11/03/2023 03:37 PM    BASOPHILS 1 11/03/2023 03:37 PM    BASOPHILS 0.10 11/03/2023 03:37 PM    PMNABS 3.80 11/03/2023 03:37 PM    LYMPHSABS 2.60 11/03/2023 03:37 PM    EOSABS 0.20 11/03/2023 03:37 PM    MONOSABS 0.60 11/03/2023 03:37 PM            Comprehensive Metabolic Profile    Lab Results   Component Value Date    SODIUM 140 11/03/2023    POTASSIUM 3.9  11/03/2023    CHLORIDE 103 11/03/2023    CO2 30 11/03/2023    ANIONGAP 7 11/03/2023    BUN 17 11/03/2023    CREATININE 0.73 11/03/2023    ALBUMIN  4.5 11/03/2023    CALCIUM  9.3 11/03/2023    GLUCOSENF 133 (H) 11/03/2023    ALKPHOS 123 (H) 11/03/2023    ALT 19 11/03/2023    AST 14 11/03/2023    TOTBILIRUBIN 0.3 11/03/2023    TOTALPROTEIN 7.2 11/03/2023  BASIC METABOLIC PANEL  Lab Results   Component Value Date    SODIUM 140 11/03/2023    POTASSIUM 3.9 11/03/2023    CHLORIDE 103 11/03/2023    CO2 30 11/03/2023    ANIONGAP 7 11/03/2023    BUN 17 11/03/2023    CREATININE 0.73 11/03/2023    BUNCRRATIO 23 (H) 11/03/2023    GFR 98 11/03/2023    CALCIUM  9.3 11/03/2023    GLUCOSENF 133 (H) 11/03/2023           ASSESSMENT:  Problem List Items Addressed This Visit          Oncology    Clear cell renal cell carcinoma, left (CMS HCC) - Primary        ICD-10-CM    1. Clear cell renal cell carcinoma, left (CMS HCC)  C64.2            PLAN:   1. All relevant medical records were reviewed including available pertinent provider notes, procedure notes, imaging, laboratory, and pathology.   2. All pertinent labs and/or imaging were reviewed with the patient.   3. Anaplastic astrocytoma:  The patient received definitive treat 8 years ago and has been stable since that time.  For this issue, we will see her back every 6 months for ongoing surveillance.  4. Renal cell carcinoma: The patient had a T IIIb lesion.  It appears to have been completely resected, but she is at risk for recurrence and adjuvant immunotherapy is indicated.  Arrangements have already been made for her to receive pembrolizumab.  We will plan on doing this once every three weeks for 12 months.  We reviewed this in detail and she is motivated to proceed.  Treatment has already been arranged for today.    Kasha Richeson was given the chance to ask questions, and these were answered to their satisfaction. The patient is welcome to call with any questions or concerns  in the meantime.     On the day of the encounter, a total of 35 minutes was spent on this patient encounter including review of historical information, examination, documentation and post-visit activities.   Return in about 3 weeks (around 11/24/2023).     Lynwood Pipes, MD  11/03/2023 , 16:23    The patient's insurance company bears full legal and financial responsibility resulting from any deviations that they cause to my recommended treatment plan.    CC:  Corean Lamer, NP  [address]  [facility] [id number]    Pcp, No  No address on file    This note was partially generated using MModal Fluency Direct system, and there may be some incorrect words, spellings, and punctuation that were not noted in checking the note before saving.

## 2023-11-05 ENCOUNTER — Other Ambulatory Visit (INDEPENDENT_AMBULATORY_CARE_PROVIDER_SITE_OTHER): Payer: Self-pay | Admitting: HEMATOLOGY-ONCOLOGY

## 2023-11-19 ENCOUNTER — Other Ambulatory Visit (INDEPENDENT_AMBULATORY_CARE_PROVIDER_SITE_OTHER): Payer: Self-pay | Admitting: HEMATOLOGY-ONCOLOGY

## 2023-11-22 NOTE — Progress Notes (Unsigned)
 [facility]  HEMATOLOGY/ONCOLOGY, [facility]  [address]  [place] [address]  [phone]   Hematology/Oncology    Clinic Progress Note      Elizabeth [name], Elizabeth [name], 53 y.o. female  Date of Birth:  Jun 13, 1970    Referral Physician:No Pcp    Chief Complaint:  RCC    HPI:  Patient is 53 year old female with PMH of hypertension, CVA, PE, sleep apnea on home O2, DM, hypothyroidism follow-up for RCC      Current Treatment:    Past Treatment:    Social History/Family History:    Review of Systems:   .    There were no vitals taken for this visit.         There is no height or weight on file to calculate BMI.     ECOG:    Physical Exam:               IMPRESSION:  1-RCC, stage III,[T3b,N0,M0].  On pembrolizumab started 10/22/2023.  2-Anaplastic astrocytoma,  DX  2015 after developing seizures; s/p  surgery followed by temozolomide plus RTX; till 2015.   .  .  [name]    PLAN:  -pembrolizumab cycle 2 on 11/24/2023 till 10/2024.  Monitor TSH.  Colonoscopy status.    Mammogram status.  .  .  .  [name] [name], MD       Portions of the current encounter note  have been copied from  the previous encounter which have been reviewed and updated where appropriate and reflects current medical decision making for today's encounter.    This note was partially created using voice recognition software and is inherently subject to errors  including those of syntax and sound alike substitutions which may escape  proof  reading.  In such  instances, original meaning may be extrapolated by contextual derivation.  Please notify any unusual errors in this record at   [phone] so these may be corrected and resolved.

## 2023-11-24 ENCOUNTER — Encounter (INDEPENDENT_AMBULATORY_CARE_PROVIDER_SITE_OTHER): Payer: Self-pay

## 2023-11-24 ENCOUNTER — Ambulatory Visit (INDEPENDENT_AMBULATORY_CARE_PROVIDER_SITE_OTHER)
Admission: RE | Admit: 2023-11-24 | Discharge: 2023-11-24 | Disposition: A | Discharge status: Home / Self Care | Payer: Self-pay | Source: Ambulatory Visit

## 2023-11-24 ENCOUNTER — Ambulatory Visit
Admission: RE | Admit: 2023-11-24 | Discharge: 2023-11-24 | Disposition: A | Discharge status: Home / Self Care | Payer: Self-pay | Source: Ambulatory Visit | Attending: HEMATOLOGY-ONCOLOGY | Admitting: HEMATOLOGY-ONCOLOGY

## 2023-11-24 ENCOUNTER — Ambulatory Visit (INDEPENDENT_AMBULATORY_CARE_PROVIDER_SITE_OTHER): Payer: Self-pay

## 2023-11-24 ENCOUNTER — Other Ambulatory Visit: Payer: Self-pay

## 2023-11-24 VITALS — BP 135/74 | HR 81 | Temp 97.8°F | Resp 20

## 2023-11-24 VITALS — BP 137/69 | HR 89 | Temp 97.7°F | Ht 62.0 in | Wt 198.0 lb

## 2023-11-24 DIAGNOSIS — C642 Malignant neoplasm of left kidney, except renal pelvis: Secondary | ICD-10-CM

## 2023-11-24 DIAGNOSIS — Z7901 Long term (current) use of anticoagulants: Secondary | ICD-10-CM | POA: Insufficient documentation

## 2023-11-24 DIAGNOSIS — I2699 Other pulmonary embolism without acute cor pulmonale: Secondary | ICD-10-CM | POA: Insufficient documentation

## 2023-11-24 DIAGNOSIS — M25519 Pain in unspecified shoulder: Secondary | ICD-10-CM

## 2023-11-24 DIAGNOSIS — Z79620 Long term (current) use of immunosuppressive biologic: Secondary | ICD-10-CM | POA: Insufficient documentation

## 2023-11-24 DIAGNOSIS — Z9889 Other specified postprocedural states: Secondary | ICD-10-CM | POA: Insufficient documentation

## 2023-11-24 DIAGNOSIS — M25512 Pain in left shoulder: Secondary | ICD-10-CM | POA: Insufficient documentation

## 2023-11-24 DIAGNOSIS — Z5112 Encounter for antineoplastic immunotherapy: Secondary | ICD-10-CM | POA: Insufficient documentation

## 2023-11-24 DIAGNOSIS — Z85841 Personal history of malignant neoplasm of brain: Secondary | ICD-10-CM | POA: Insufficient documentation

## 2023-11-24 LAB — THYROID STIMULATING HORMONE WITH FREE T4 REFLEX: TSH: 2.797 u[IU]/mL (ref 0.450–5.330)

## 2023-11-24 LAB — COMPREHENSIVE METABOLIC PANEL, NON-FASTING
ALBUMIN/GLOBULIN RATIO: 1.9 — ABNORMAL HIGH (ref 0.8–1.4)
ALBUMIN: 4.4 g/dL (ref 3.5–5.7)
ALKALINE PHOSPHATASE: 120 U/L — ABNORMAL HIGH (ref 34–104)
ALT (SGPT): 18 U/L (ref 7–52)
ANION GAP: 10 mmol/L (ref 4–13)
AST (SGOT): 15 U/L (ref 13–39)
BILIRUBIN TOTAL: 0.3 mg/dL (ref 0.3–1.0)
BUN/CREA RATIO: 22 (ref 6–22)
BUN: 17 mg/dL (ref 7–25)
CALCIUM, CORRECTED: 8.8 mg/dL — ABNORMAL LOW (ref 8.9–10.8)
CALCIUM: 9.1 mg/dL (ref 8.6–10.3)
CHLORIDE: 108 mmol/L — ABNORMAL HIGH (ref 98–107)
CO2 TOTAL: 24 mmol/L (ref 21–31)
CREATININE: 0.77 mg/dL (ref 0.60–1.30)
ESTIMATED GFR: 92 mL/min/1.73mˆ2 (ref >59)
GLOBULIN: 2.3 (ref 2.0–3.5)
GLUCOSE: 140 mg/dL — ABNORMAL HIGH (ref 74–109)
OSMOLALITY, CALCULATED: 287 mosm/kg (ref 270–290)
POTASSIUM: 4 mmol/L (ref 3.5–5.1)
PROTEIN TOTAL: 6.7 g/dL (ref 6.4–8.9)
SODIUM: 142 mmol/L (ref 136–145)

## 2023-11-24 LAB — CBC WITH DIFF
BASOPHIL #: 0.1 x10ˆ3/uL (ref 0.00–0.10)
BASOPHIL %: 1 % (ref 0–1)
EOSINOPHIL #: 0.2 x10ˆ3/uL (ref 0.00–0.50)
EOSINOPHIL %: 2 % (ref 1–7)
HCT: 36.3 % (ref 31.2–41.9)
HGB: 12.9 g/dL (ref 10.9–14.3)
LYMPHOCYTE #: 2.3 x10ˆ3/uL (ref 1.10–3.10)
LYMPHOCYTE %: 32 % (ref 16–46)
MCH: 31.8 pg (ref 24.7–32.8)
MCHC: 35.5 g/dL (ref 32.3–35.6)
MCV: 89.6 fL (ref 75.5–95.3)
MONOCYTE #: 0.4 x10ˆ3/uL (ref 0.20–0.90)
MONOCYTE %: 6 % (ref 4–11)
MPV: 7.3 fL — ABNORMAL LOW (ref 7.9–10.8)
NEUTROPHIL #: 4.2 x10ˆ3/uL (ref 1.90–8.20)
NEUTROPHIL %: 59 % (ref 43–77)
PLATELETS: 217 x10ˆ3/uL (ref 140–440)
RBC: 4.05 x10ˆ6/uL (ref 3.63–4.92)
RDW: 15.3 % (ref 12.3–17.7)
WBC: 7.1 x10ˆ3/uL (ref 3.8–11.8)

## 2023-11-24 LAB — MAGNESIUM: MAGNESIUM: 1.9 mg/dL (ref 1.9–2.7)

## 2023-11-24 MED ORDER — MEPERIDINE (PF) 25 MG/ML INJECTION SOLUTION
12.5000 mg | Freq: Once | INTRAMUSCULAR | Status: DC | PRN
Start: 2023-11-24 — End: 2023-11-25

## 2023-11-24 MED ORDER — ONDANSETRON HCL 8 MG TABLET
8.0000 mg | ORAL_TABLET | Freq: Once | ORAL | Status: DC | PRN
Start: 2023-11-24 — End: 2023-11-25

## 2023-11-24 MED ORDER — DIPHENHYDRAMINE 50 MG/ML INJECTION SOLUTION
25.0000 mg | Freq: Once | INTRAMUSCULAR | Status: DC | PRN
Start: 2023-11-24 — End: 2023-11-25

## 2023-11-24 MED ORDER — HYDROCORTISONE SOD SUCCINATE 100 MG/2 ML VIAL WRAPPER
100.0000 mg | Freq: Once | INTRAMUSCULAR | Status: DC | PRN
Start: 2023-11-24 — End: 2023-11-25

## 2023-11-24 MED ORDER — ALBUTEROL SULFATE 2.5 MG/3 ML (0.083 %) SOLUTION FOR NEBULIZATION
2.5000 mg | INHALATION_SOLUTION | Freq: Once | RESPIRATORY_TRACT | Status: DC | PRN
Start: 2023-11-24 — End: 2023-11-25

## 2023-11-24 MED ORDER — EPINEPHRINE 1 MG/ML (1 ML) INJECTION SOLUTION
0.3000 mg | Freq: Once | INTRAMUSCULAR | Status: DC | PRN
Start: 2023-11-24 — End: 2023-11-25

## 2023-11-24 MED ORDER — SODIUM CHLORIDE 0.9% FLUSH BAG - 250 ML
INTRAVENOUS | Status: DC | PRN
Start: 2023-11-24 — End: 2023-11-25

## 2023-11-24 MED ORDER — DIPHENHYDRAMINE 50 MG/ML INJECTION SOLUTION
50.0000 mg | Freq: Once | INTRAMUSCULAR | Status: DC | PRN
Start: 2023-11-24 — End: 2023-11-25

## 2023-11-24 MED ORDER — SODIUM CHLORIDE 0.9 % IV BOLUS
500.0000 mL | INJECTION | Freq: Once | Status: DC | PRN
Start: 2023-11-24 — End: 2023-11-25

## 2023-11-24 MED ORDER — SODIUM CHLORIDE 0.9 % INTRAVENOUS SOLUTION
200.0000 mg | Freq: Once | INTRAVENOUS | Status: AC
Start: 2023-11-24 — End: 2023-11-24
  Administered 2023-11-24: 200 mg via INTRAVENOUS
  Filled 2023-11-24: qty 8

## 2023-11-24 MED ORDER — ONDANSETRON HCL (PF) 4 MG/2 ML INJECTION SOLUTION
8.0000 mg | Freq: Once | INTRAMUSCULAR | Status: DC | PRN
Start: 2023-11-24 — End: 2023-11-25

## 2023-11-24 MED ORDER — ALBUTEROL SULFATE HFA 90 MCG/ACTUATION AEROSOL INHALER - RN
2.0000 | Freq: Once | RESPIRATORY_TRACT | Status: DC | PRN
Start: 2023-11-24 — End: 2023-11-25

## 2023-11-24 MED ORDER — DEXTROSE 5% IN WATER (D5W) FLUSH BAG - 250 ML
INTRAVENOUS | Status: DC | PRN
Start: 2023-11-24 — End: 2023-11-25

## 2023-11-24 MED ORDER — FAMOTIDINE (PF) 20 MG/2 ML INTRAVENOUS SOLUTION
20.0000 mg | Freq: Once | INTRAVENOUS | Status: DC | PRN
Start: 2023-11-24 — End: 2023-11-25

## 2023-11-24 NOTE — Nursing Note (Addendum)
 Triaged patient and placed in room for this in person visit with Dr. Marcey due to clear cell Renal carcinoma. I reviewed her vitals, fall status, and also all current medications that she is taking at this time. This is Dr. Irby patient and we are just seeing her because his schedule was full. She gets her probrolizumab today . Dr. Marcey ordered her a left shoulder xray due to pain, VQ scan and CXR d/t S.O.B. she will come back to see Dr. Moses in 3 weeks.

## 2023-11-24 NOTE — Nurses Notes (Signed)
 [phone] Patient came in via w/c with son for treatment after seeing Dr. Marcey at the Oncology clinic. Port accessed. Keytruda given over 30 minutes. Tolerated well. Port flushed and deaccessed. Site clear. Left  unit via w/c with son. No c/o offered. Sharlet Montclair, RN

## 2023-11-25 ENCOUNTER — Encounter (INDEPENDENT_AMBULATORY_CARE_PROVIDER_SITE_OTHER): Payer: Self-pay

## 2023-11-25 NOTE — Nursing Note (Signed)
 Upon completing chart review, noted that Dr. Marcey ordered CT Chest to r/o PE r/t patient c/o shortness of breath, scheduled for 12/31/2023. Sent secure chat to CT staff and requested that scan be scheduled for ASAP, given appt date and time of 11/27/2023 at 1330 PM. Called patient and gave her date, time, and instructions; patient verbalized agreement and understanding and reports she will be there.

## 2023-11-26 NOTE — Nursing Note (Signed)
 Patient completed psychosocial distress screening using the Enhanced NCCN Distress Thermometer (DT) Tool and self-reported an overall score of 3. Patient has some chronic joint pain, reports appetite is fair, weight stable. Patient still performing all ADLS independently, ambulates with cane.

## 2023-11-27 ENCOUNTER — Ambulatory Visit
Admission: RE | Admit: 2023-11-27 | Discharge: 2023-11-27 | Disposition: A | Discharge status: Home / Self Care | Source: Ambulatory Visit

## 2023-11-27 ENCOUNTER — Other Ambulatory Visit: Payer: Self-pay

## 2023-11-27 DIAGNOSIS — I2699 Other pulmonary embolism without acute cor pulmonale: Secondary | ICD-10-CM | POA: Insufficient documentation

## 2023-11-27 DIAGNOSIS — Z86711 Personal history of pulmonary embolism: Secondary | ICD-10-CM

## 2023-11-27 DIAGNOSIS — R590 Localized enlarged lymph nodes: Secondary | ICD-10-CM

## 2023-11-27 MED ORDER — IOPAMIDOL 370 MG IODINE/ML (76 %) INTRAVENOUS SOLUTION
75.0000 mL | INTRAVENOUS | Status: AC
Start: 2023-11-27 — End: 2023-11-27
  Administered 2023-11-27: 75 mL via INTRAVENOUS

## 2023-12-10 ENCOUNTER — Other Ambulatory Visit (INDEPENDENT_AMBULATORY_CARE_PROVIDER_SITE_OTHER): Payer: Self-pay | Admitting: HEMATOLOGY-ONCOLOGY

## 2023-12-15 ENCOUNTER — Ambulatory Visit (INDEPENDENT_AMBULATORY_CARE_PROVIDER_SITE_OTHER)
Admission: RE | Admit: 2023-12-15 | Discharge: 2023-12-15 | Disposition: A | Source: Ambulatory Visit | Attending: HEMATOLOGY-ONCOLOGY

## 2023-12-15 ENCOUNTER — Ambulatory Visit (INDEPENDENT_AMBULATORY_CARE_PROVIDER_SITE_OTHER): Payer: Self-pay | Admitting: HEMATOLOGY-ONCOLOGY

## 2023-12-15 ENCOUNTER — Other Ambulatory Visit: Payer: Self-pay

## 2023-12-15 ENCOUNTER — Ambulatory Visit
Admission: RE | Admit: 2023-12-15 | Discharge: 2023-12-15 | Disposition: A | Discharge status: Home / Self Care | Payer: Self-pay | Source: Ambulatory Visit | Attending: HEMATOLOGY-ONCOLOGY | Admitting: HEMATOLOGY-ONCOLOGY

## 2023-12-15 ENCOUNTER — Encounter (INDEPENDENT_AMBULATORY_CARE_PROVIDER_SITE_OTHER): Payer: Self-pay | Admitting: HEMATOLOGY-ONCOLOGY

## 2023-12-15 VITALS — BP 125/70 | HR 78 | Temp 97.2°F | Resp 18

## 2023-12-15 VITALS — BP 137/73 | HR 84 | Temp 97.7°F | Ht 62.0 in | Wt 196.8 lb

## 2023-12-15 DIAGNOSIS — Z5112 Encounter for antineoplastic immunotherapy: Secondary | ICD-10-CM | POA: Insufficient documentation

## 2023-12-15 DIAGNOSIS — C642 Malignant neoplasm of left kidney, except renal pelvis: Secondary | ICD-10-CM

## 2023-12-15 DIAGNOSIS — Z9889 Other specified postprocedural states: Secondary | ICD-10-CM | POA: Insufficient documentation

## 2023-12-15 DIAGNOSIS — Z923 Personal history of irradiation: Secondary | ICD-10-CM | POA: Insufficient documentation

## 2023-12-15 DIAGNOSIS — Z79620 Long term (current) use of immunosuppressive biologic: Secondary | ICD-10-CM | POA: Insufficient documentation

## 2023-12-15 DIAGNOSIS — Z79899 Other long term (current) drug therapy: Secondary | ICD-10-CM | POA: Insufficient documentation

## 2023-12-15 DIAGNOSIS — Z7901 Long term (current) use of anticoagulants: Secondary | ICD-10-CM | POA: Insufficient documentation

## 2023-12-15 DIAGNOSIS — Z86711 Personal history of pulmonary embolism: Secondary | ICD-10-CM | POA: Insufficient documentation

## 2023-12-15 DIAGNOSIS — Z85841 Personal history of malignant neoplasm of brain: Secondary | ICD-10-CM | POA: Insufficient documentation

## 2023-12-15 LAB — CBC WITH DIFF
BASOPHIL #: 0.1 x10ˆ3/uL (ref 0.00–0.10)
BASOPHIL %: 1 % (ref 0–1)
EOSINOPHIL #: 0.1 x10ˆ3/uL (ref 0.00–0.50)
EOSINOPHIL %: 2 % (ref 1–7)
HCT: 37.5 % (ref 31.2–41.9)
HGB: 12.9 g/dL (ref 10.9–14.3)
LYMPHOCYTE #: 2.4 x10ˆ3/uL (ref 1.10–3.10)
LYMPHOCYTE %: 31 % (ref 16–46)
MCH: 31.5 pg (ref 24.7–32.8)
MCHC: 34.5 g/dL (ref 32.3–35.6)
MCV: 91.3 fL (ref 75.5–95.3)
MONOCYTE #: 0.6 x10ˆ3/uL (ref 0.20–0.90)
MONOCYTE %: 8 % (ref 4–11)
MPV: 7.3 fL — ABNORMAL LOW (ref 7.9–10.8)
NEUTROPHIL #: 4.5 x10ˆ3/uL (ref 1.90–8.20)
NEUTROPHIL %: 59 % (ref 43–77)
PLATELETS: 215 x10ˆ3/uL (ref 140–440)
RBC: 4.11 x10ˆ6/uL (ref 3.63–4.92)
RDW: 15.2 % (ref 12.3–17.7)
WBC: 7.7 x10ˆ3/uL (ref 3.8–11.8)

## 2023-12-15 LAB — COMPREHENSIVE METABOLIC PANEL, NON-FASTING
ALBUMIN/GLOBULIN RATIO: 1.7 — ABNORMAL HIGH (ref 0.8–1.4)
ALBUMIN: 4.3 g/dL (ref 3.5–5.7)
ALKALINE PHOSPHATASE: 109 U/L — ABNORMAL HIGH (ref 34–104)
ALT (SGPT): 20 U/L (ref 7–52)
ANION GAP: 11 mmol/L (ref 4–13)
AST (SGOT): 15 U/L (ref 13–39)
BILIRUBIN TOTAL: 0.3 mg/dL (ref 0.3–1.0)
BUN/CREA RATIO: 30 — ABNORMAL HIGH (ref 6–22)
BUN: 21 mg/dL (ref 7–25)
CALCIUM, CORRECTED: 9.2 mg/dL (ref 8.9–10.8)
CALCIUM: 9.4 mg/dL (ref 8.6–10.3)
CHLORIDE: 107 mmol/L (ref 98–107)
CO2 TOTAL: 21 mmol/L (ref 21–31)
CREATININE: 0.69 mg/dL (ref 0.60–1.30)
ESTIMATED GFR: 103 mL/min/1.73mˆ2 (ref >59)
GLOBULIN: 2.6 (ref 2.0–3.5)
GLUCOSE: 115 mg/dL — ABNORMAL HIGH (ref 74–109)
OSMOLALITY, CALCULATED: 282 mosm/kg (ref 270–290)
POTASSIUM: 4.1 mmol/L (ref 3.5–5.1)
PROTEIN TOTAL: 6.9 g/dL (ref 6.4–8.9)
SODIUM: 139 mmol/L (ref 136–145)

## 2023-12-15 LAB — THYROID STIMULATING HORMONE WITH FREE T4 REFLEX: TSH: 4.053 u[IU]/mL (ref 0.450–5.330)

## 2023-12-15 LAB — MAGNESIUM: MAGNESIUM: 2 mg/dL (ref 1.9–2.7)

## 2023-12-15 MED ORDER — DEXTROSE 5% IN WATER (D5W) FLUSH BAG - 250 ML
INTRAVENOUS | Status: DC | PRN
Start: 2023-12-15 — End: 2023-12-16

## 2023-12-15 MED ORDER — ALBUTEROL SULFATE HFA 90 MCG/ACTUATION AEROSOL INHALER - RN
2.0000 | Freq: Once | RESPIRATORY_TRACT | Status: DC | PRN
Start: 2023-12-15 — End: 2023-12-16

## 2023-12-15 MED ORDER — SODIUM CHLORIDE 0.9% FLUSH BAG - 250 ML
INTRAVENOUS | Status: DC | PRN
Start: 2023-12-15 — End: 2023-12-16

## 2023-12-15 MED ORDER — DIPHENHYDRAMINE 50 MG/ML INJECTION SOLUTION
25.0000 mg | Freq: Once | INTRAMUSCULAR | Status: DC | PRN
Start: 2023-12-15 — End: 2023-12-16

## 2023-12-15 MED ORDER — DIPHENHYDRAMINE 50 MG/ML INJECTION SOLUTION
50.0000 mg | Freq: Once | INTRAMUSCULAR | Status: DC | PRN
Start: 2023-12-15 — End: 2023-12-16

## 2023-12-15 MED ORDER — MEPERIDINE (PF) 25 MG/ML INJECTION SOLUTION
12.5000 mg | Freq: Once | INTRAMUSCULAR | Status: DC | PRN
Start: 2023-12-15 — End: 2023-12-16

## 2023-12-15 MED ORDER — ONDANSETRON HCL (PF) 4 MG/2 ML INJECTION SOLUTION
8.0000 mg | Freq: Once | INTRAMUSCULAR | Status: DC | PRN
Start: 2023-12-15 — End: 2023-12-16

## 2023-12-15 MED ORDER — FAMOTIDINE (PF) 20 MG/2 ML INTRAVENOUS SOLUTION
20.0000 mg | Freq: Once | INTRAVENOUS | Status: DC | PRN
Start: 2023-12-15 — End: 2023-12-16

## 2023-12-15 MED ORDER — ALBUTEROL SULFATE 2.5 MG/3 ML (0.083 %) SOLUTION FOR NEBULIZATION
2.5000 mg | INHALATION_SOLUTION | Freq: Once | RESPIRATORY_TRACT | Status: DC | PRN
Start: 2023-12-15 — End: 2023-12-16

## 2023-12-15 MED ORDER — EPINEPHRINE 1 MG/ML (1 ML) INJECTION SOLUTION
0.3000 mg | Freq: Once | INTRAMUSCULAR | Status: DC | PRN
Start: 2023-12-15 — End: 2023-12-16

## 2023-12-15 MED ORDER — SODIUM CHLORIDE 0.9 % IV BOLUS
500.0000 mL | INJECTION | Freq: Once | Status: DC | PRN
Start: 2023-12-15 — End: 2023-12-16

## 2023-12-15 MED ORDER — SODIUM CHLORIDE 0.9 % INTRAVENOUS SOLUTION
200.0000 mg | Freq: Once | INTRAVENOUS | Status: AC
Start: 2023-12-15 — End: 2023-12-15
  Administered 2023-12-15: 200 mg via INTRAVENOUS
  Administered 2023-12-15: 0 mg via INTRAVENOUS
  Filled 2023-12-15: qty 8

## 2023-12-15 MED ORDER — HYDROCORTISONE SOD SUCCINATE 100 MG/2 ML VIAL WRAPPER
100.0000 mg | Freq: Once | INTRAMUSCULAR | Status: DC | PRN
Start: 2023-12-15 — End: 2023-12-16

## 2023-12-15 MED ORDER — ONDANSETRON HCL 8 MG TABLET
8.0000 mg | ORAL_TABLET | Freq: Once | ORAL | Status: DC | PRN
Start: 2023-12-15 — End: 2023-12-16

## 2023-12-15 NOTE — Progress Notes (Signed)
 Department of Hematology/Oncology  Progress Note   Name: Elizabeth [name]  FMW:[id number]  Date of Birth: 10/08/1970  Encounter Date: 12/15/2023    REFERRING PROVIDER:  Theo Krabbe, APRN, CNP  [address]  [place],  [place] [address]    REASON FOR OFFICE VISIT:  Renal Cell Cancer     HISTORY OF PRESENT ILLNESS:  Elizabeth [name] is a 53 y.o. 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  She was treated with surgery followed by temozolomide plus radiation.  The combination therapy was finished in October 2015.    Clinically, she is been doing well since that time.  She does have some aphasia and word-finding difficulty.    11/03/2023: The patient is here for follow up of renal cell carcinoma.  The lesion was T IIIb, with all margins negative.  No lymph nodes were seen in the sample.    12/15/2023: The patient is here for follow up of renal cell carcinoma and pulmonary emboli.  She has been on treatment with adjuvant pembrolizumab  and denies any new issues associated with that.  She has been on anticoagulation since June 11, at which time she was found to have segmental and subsegmental pulmonary emboli.  Because she is on phenytoin , she was not able to take an oral anticoagulant and has been on low-molecular weight heparin  injections.    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    Chest pain, unspecified type 07/17/2023    CPAP (continuous positive airway pressure) dependence     CVA (cerebrovascular accident)     mini during brain surgery per pt    Diabetes mellitus, type 2     Diabetes mellitus, type 2     Disorder of thyroid      Dyspnea, unspecified type 07/17/2023    Epileptic seizure (CMS HCC)     Essential hypertension     GERD (gastroesophageal reflux disease) 08/02/2023     History of kidney surgery 06/2023    Pulmonary embolism 07/16/2023    Sleep apnea          Past Surgical History:   Procedure Laterality Date    BRAIN SURGERY      GASTROJEJUNOSTOMY W/ JEJUNOSTOMY TUBE      HX CHOLECYSTECTOMY      HX HERNIA REPAIR      KIDNEY SURGERY Left     removal         Social History     Socioeconomic History    Marital status: Divorced     Spouse name: Not on file    Number of children: Not on file    Years of education: Not on file    Highest education level: Not on file   Occupational History    Not on file   Tobacco Use    Smoking status: Never    Smokeless tobacco: Never   Vaping Use    Vaping status: Never Used   Substance and Sexual Activity    Alcohol  use: Never    Drug use: Never    Sexual activity: Not on file   Other Topics Concern    Ability to Walk 1 Flight of Steps without SOB/CP Yes    Routine Exercise  No    Ability to Walk 2 Flight of Steps without SOB/CP No    Unable to Ambulate Not Asked    Total Care Not Asked    Ability To Do Own ADL's Yes    Uses Walker Not Asked    Other Activity Level Not Asked    Uses Cane Not Asked   Social History Narrative    Not on file     Social Determinants of Health     Financial Resource Strain: Low Risk (07/24/2023)    Financial Resource Strain     SDOH Financial: No   Transportation Needs: High Risk (07/24/2023)    Transportation Needs     SDOH Transportation: Yes, it has kept me from medical appointments or from getting my medications   Social Connections: Low Risk (07/24/2023)    Social Connections     SDOH Social Isolation: 5 or more times a week   Intimate Partner Violence: Not on file   Housing Stability: Low Risk (07/24/2023)    Housing Stability     SDOH Housing Situation: I have housing.     SDOH Housing Worry: No     Family Medical History:       Problem Relation (Age of Onset)    Arthritis-osteo Mother    Breast Cancer Paternal Grandmother    Heart Disease Father    Hypertension (High Blood Pressure) Mother    No Known Problems Sister,  Brother, Maternal Grandmother, Maternal Grandfather, Paternal Grandfather, Daughter, Son, Maternal Aunt, Maternal Uncle, Paternal Aunt, Paternal Uncle, Other            Current Outpatient Medications   Medication Sig    ACCU-CHEK SOFTCLIX LANCETS Misc CHECK BLOOD SUGAR EVERY DAY AS DIRECTED    atorvastatin  (LIPITOR) 20 mg Oral Tablet Take 1 Tablet (20 mg total) by mouth Every night    ergocalciferol , vitamin D2, (DRISDOL ) 1,250 mcg (50,000 unit) Oral Capsule Take 1 Capsule (50,000 Units total) by mouth Every Sunday    escitalopram  oxalate (LEXAPRO ) 20 mg Oral Tablet Take 1 Tablet (20 mg total) by mouth Daily    famotidine  (PEPCID ) 20 mg Oral Tablet Take 1 Tablet (20 mg total) by mouth Daily    FREESTYLE LANCETS 28 gauge Misc TEST ONCE DAILY    FREESTYLE LITE METER Kit EVERY DAY    FREESTYLE LITE STRIPS Strip TEST EVERY DAY    hydroCHLOROthiazide  (HYDRODIURIL ) 25 mg Oral Tablet Take 1 Tablet (25 mg total) by mouth Every morning    levothyroxine  (SYNTHROID ) 25 mcg Oral Tablet TAKE 1 TABLET ONCE A DAY IN THE MORNING 30 MINUTES BEFORE MEALS OR ANY OTHER MEDICATIONS    losartan  (COZAAR ) 100 mg Oral Tablet Take 1 Tablet (100 mg total) by mouth Daily    magnesium  Oxide 420 mg Oral Tablet Take 1 Tablet (420 mg total) by mouth Daily    methocarbamoL  (ROBAXIN ) 500 mg Oral Tablet Take 1 Tablet (500 mg total) by mouth Four times a day as needed    ondansetron  (ZOFRAN  ODT) 4 mg Oral Tablet, Rapid Dissolve Take 1 Tablet (4 mg total) by mouth Every 6 hours as needed for Nausea/Vomiting Indications: prevent nausea and vomiting from cancer chemotherapy    ondansetron  (ZOFRAN  ODT) 4 mg Oral Tablet, Rapid Dissolve Take 1 Tablet (4 mg total) by mouth Every 8 hours as needed for Nausea/Vomiting    oxyCODONE  (ROXICODONE ) 5 mg Oral Tablet Take 1 Tablet (5 mg total) by mouth Every 6 hours as needed for Pain  phenytoin  sodium extended release (DILANTIN ) 100 mg Oral Capsule TAKE 3 CAPSULES BY MOUTH TWICE A DAY    traMADoL  (ULTRAM ) 50 mg  Oral Tablet Take 1 Tablet (50 mg total) by mouth Every 4 hours as needed for Pain     Allergies   Allergen Reactions    Ace Inhibitors      Other reaction(s): Angioedema       PHYSICAL EXAM:  Most Recent Vitals    Flowsheet Row Office Visit from 07/05/2021 in Hematology/Oncology, [facility]   Temperature 36.8 C (98.3 F) filed at... 07/05/2021 1548   Heart Rate 76 filed at... 07/05/2021 1548   Respiratory Rate --   BP (Non-Invasive) 158/70 filed at... 07/05/2021 1548   SpO2 --   Height 1.575 m (5' 2) filed at... 07/05/2021 1548   Weight 94.5 kg (208 lb 4.8 oz) filed at... 07/05/2021 1548   BMI (Calculated) 38.18 filed at... 07/05/2021 1548   BSA (Calculated) 2.03 filed at... 07/05/2021 1548      ECOG Status: (1) Restricted in physically strenuous activity, ambulatory and able to do work of light nature   Physical Exam  Constitutional:       General: She is not in acute distress.     Appearance: Normal appearance.   Eyes:      Extraocular Movements: Extraocular movements intact.   Cardiovascular:      Rate and Rhythm: Normal rate and regular rhythm.   Pulmonary:      Effort: Pulmonary effort is normal.   Abdominal:      General: Abdomen is flat.      Palpations: Abdomen is soft.   Musculoskeletal:         General: Normal range of motion.      Cervical back: Normal range of motion.   Skin:     General: Skin is warm and dry.   Neurological:      General: No focal deficit present.      Mental Status: She is alert.   Psychiatric:         Mood and Affect: Mood normal.         DIAGNOSTIC DATA:  No results found for this or any previous visit (from the past 82479 hours).    LABS:   CBC  Diff   Lab Results   Component Value Date/Time    WBC 7.7 12/15/2023 01:06 PM    HGB 12.9 12/15/2023 01:06 PM    HCT 37.5 12/15/2023 01:06 PM    PLTCNT 215 12/15/2023 01:06 PM    RBC 4.11 12/15/2023 01:06 PM    MCV 91.3 12/15/2023 01:06 PM    MCHC 34.5 12/15/2023 01:06 PM    MCH 31.5 12/15/2023 01:06 PM    RDW 15.2  12/15/2023 01:06 PM    MPV 7.3 (L) 12/15/2023 01:06 PM    Lab Results   Component Value Date/Time    PMNS 59 12/15/2023 01:06 PM    LYMPHOCYTES 31 12/15/2023 01:06 PM    EOSINOPHIL 2 12/15/2023 01:06 PM    MONOCYTES 8 12/15/2023 01:06 PM    BASOPHILS 1 12/15/2023 01:06 PM    BASOPHILS 0.10 12/15/2023 01:06 PM    PMNABS 4.50 12/15/2023 01:06 PM    LYMPHSABS 2.40 12/15/2023 01:06 PM    EOSABS 0.10 12/15/2023 01:06 PM    MONOSABS 0.60 12/15/2023 01:06 PM            Comprehensive Metabolic Profile    Lab Results   Component Value  Date    SODIUM 139 12/15/2023    POTASSIUM 4.1 12/15/2023    CHLORIDE 107 12/15/2023    CO2 21 12/15/2023    ANIONGAP 11 12/15/2023    BUN 21 12/15/2023    CREATININE 0.69 12/15/2023    ALBUMIN  4.3 12/15/2023    CALCIUM  9.4 12/15/2023    GLUCOSENF 115 (H) 12/15/2023    ALKPHOS 109 (H) 12/15/2023    ALT 20 12/15/2023    AST 15 12/15/2023    TOTBILIRUBIN 0.3 12/15/2023    TOTALPROTEIN 6.9 12/15/2023          BASIC METABOLIC PANEL  Lab Results   Component Value Date    SODIUM 139 12/15/2023    POTASSIUM 4.1 12/15/2023    CHLORIDE 107 12/15/2023    CO2 21 12/15/2023    ANIONGAP 11 12/15/2023    BUN 21 12/15/2023    CREATININE 0.69 12/15/2023    BUNCRRATIO 30 (H) 12/15/2023    GFR 103 12/15/2023    CALCIUM  9.4 12/15/2023    GLUCOSENF 115 (H) 12/15/2023           ASSESSMENT:  Problem List Items Addressed This Visit          Oncology    Clear cell renal cell carcinoma, left (CMS HCC) - Primary        ICD-10-CM    1. Clear cell renal cell carcinoma, left (CMS HCC)  C64.2            PLAN:   1. All relevant medical records were reviewed including available pertinent provider notes, procedure notes, imaging, laboratory, and pathology.   2. All pertinent labs and/or imaging were reviewed with the patient.   3. Anaplastic astrocytoma:  The patient received definitive treat 8 years ago and has been stable since that time.  For this issue, we will see her back every 6 months for ongoing surveillance.  4.  Renal cell carcinoma: The patient had a T IIIb lesion.  It appears to have been completely resected, but she is at risk for recurrence and adjuvant immunotherapy is indicated.  Continue adjuvant pembrolizumab  to complete 12 months of therapy.  5. Pulmonary emboli:  She had relatively low volume pulmonary emboli in June, and is therefore been on anticoagulation for about five months.  This should be adequate therapy so she can discontinue the injections at this time    [facility] was given the chance to ask questions, and these were answered to their satisfaction. The patient is welcome to call with any questions or concerns in the meantime.     On the day of the encounter, a total of 35 minutes was spent on this patient encounter including review of historical information, examination, documentation and post-visit activities.   Return in about 3 weeks (around 01/05/2024).     Lynwood Pipes, MD  12/15/2023 , 13:54    The patient's insurance company bears full legal and financial responsibility resulting from any deviations that they cause to my recommended treatment plan.    CC:  Corean Lamer, APRN, CNP  [address]  [place] [address]    Lamer Corean, APRN, CNP  [address]  [place],  [place] [address]    This note was partially generated using MModal Fluency Direct system, and there may be some incorrect words, spellings, and punctuation that were not noted in checking the note before saving.

## 2023-12-15 NOTE — Nurses Notes (Signed)
 1356: Patient came in via w/c with son for treatment after seeing Dr. Marcey at the Oncology clinic. Madelin Clay, RN  1430:Right Port accessed per protocol. Meds released.Madelin Clay, RN  [phone]: Keytruda  given over 30 minutes. Tolerated well. Madelin Clay, RN  [phone]: Port flushed and deaccessed. Site clear.VSS.Madelin Clay, RN  [phone]: Left  unit via w/c with son.  No new distress reported.Madelin Clay, RN

## 2023-12-15 NOTE — Nursing Note (Signed)
 Patient in room asking about Eliquis . Patient has been on Eliquis  for 5 months and can stop. All questions answered. Went over labs. Treatment today.

## 2023-12-26 ENCOUNTER — Other Ambulatory Visit (HOSPITAL_COMMUNITY): Payer: Self-pay | Admitting: NURSE PRACTITIONER

## 2023-12-26 ENCOUNTER — Other Ambulatory Visit: Payer: Self-pay

## 2023-12-26 ENCOUNTER — Ambulatory Visit
Admission: RE | Admit: 2023-12-26 | Discharge: 2023-12-26 | Disposition: A | Source: Ambulatory Visit | Attending: NURSE PRACTITIONER

## 2023-12-26 DIAGNOSIS — M25512 Pain in left shoulder: Secondary | ICD-10-CM

## 2023-12-30 ENCOUNTER — Other Ambulatory Visit (INDEPENDENT_AMBULATORY_CARE_PROVIDER_SITE_OTHER): Payer: Self-pay | Admitting: HEMATOLOGY-ONCOLOGY

## 2023-12-31 ENCOUNTER — Ambulatory Visit (HOSPITAL_BASED_OUTPATIENT_CLINIC_OR_DEPARTMENT_OTHER): Payer: Self-pay

## 2024-01-05 ENCOUNTER — Ambulatory Visit
Admission: RE | Admit: 2024-01-05 | Discharge: 2024-01-05 | Disposition: A | Source: Ambulatory Visit | Attending: HEMATOLOGY-ONCOLOGY

## 2024-01-05 ENCOUNTER — Ambulatory Visit (INDEPENDENT_AMBULATORY_CARE_PROVIDER_SITE_OTHER)
Admission: RE | Admit: 2024-01-05 | Discharge: 2024-01-05 | Disposition: A | Payer: Self-pay | Source: Ambulatory Visit | Attending: HEMATOLOGY-ONCOLOGY

## 2024-01-05 ENCOUNTER — Other Ambulatory Visit: Payer: Self-pay

## 2024-01-05 ENCOUNTER — Encounter (INDEPENDENT_AMBULATORY_CARE_PROVIDER_SITE_OTHER): Payer: Self-pay | Admitting: HEMATOLOGY-ONCOLOGY

## 2024-01-05 ENCOUNTER — Ambulatory Visit (INDEPENDENT_AMBULATORY_CARE_PROVIDER_SITE_OTHER): Payer: Self-pay | Admitting: HEMATOLOGY-ONCOLOGY

## 2024-01-05 VITALS — BP 136/72 | HR 80 | Temp 97.4°F | Resp 18

## 2024-01-05 VITALS — BP 131/66 | HR 80 | Temp 98.9°F | Ht 62.0 in | Wt 205.7 lb

## 2024-01-05 DIAGNOSIS — Z9889 Other specified postprocedural states: Secondary | ICD-10-CM | POA: Insufficient documentation

## 2024-01-05 DIAGNOSIS — Z79899 Other long term (current) drug therapy: Secondary | ICD-10-CM | POA: Insufficient documentation

## 2024-01-05 DIAGNOSIS — Z86711 Personal history of pulmonary embolism: Secondary | ICD-10-CM | POA: Insufficient documentation

## 2024-01-05 DIAGNOSIS — C642 Malignant neoplasm of left kidney, except renal pelvis: Secondary | ICD-10-CM

## 2024-01-05 DIAGNOSIS — Z85841 Personal history of malignant neoplasm of brain: Secondary | ICD-10-CM | POA: Insufficient documentation

## 2024-01-05 DIAGNOSIS — Z5112 Encounter for antineoplastic immunotherapy: Secondary | ICD-10-CM | POA: Insufficient documentation

## 2024-01-05 DIAGNOSIS — Z79620 Long term (current) use of immunosuppressive biologic: Secondary | ICD-10-CM | POA: Insufficient documentation

## 2024-01-05 DIAGNOSIS — I2699 Other pulmonary embolism without acute cor pulmonale: Secondary | ICD-10-CM

## 2024-01-05 LAB — THYROID STIMULATING HORMONE WITH FREE T4 REFLEX: TSH: 1.915 u[IU]/mL (ref 0.450–5.330)

## 2024-01-05 LAB — COMPREHENSIVE METABOLIC PANEL, NON-FASTING
ALBUMIN/GLOBULIN RATIO: 1.8 — ABNORMAL HIGH (ref 0.8–1.4)
ALBUMIN: 4.3 g/dL (ref 3.5–5.7)
ALKALINE PHOSPHATASE: 96 U/L (ref 34–104)
ALT (SGPT): 19 U/L (ref 7–52)
ANION GAP: 8 mmol/L (ref 4–13)
AST (SGOT): 12 U/L — ABNORMAL LOW (ref 13–39)
BILIRUBIN TOTAL: 0.3 mg/dL (ref 0.3–1.0)
BUN/CREA RATIO: 19 (ref 6–22)
BUN: 17 mg/dL (ref 7–25)
CALCIUM, CORRECTED: 9 mg/dL (ref 8.9–10.8)
CALCIUM: 9.2 mg/dL (ref 8.6–10.3)
CHLORIDE: 106 mmol/L (ref 98–107)
CO2 TOTAL: 27 mmol/L (ref 21–31)
CREATININE: 0.91 mg/dL (ref 0.60–1.30)
ESTIMATED GFR: 75 mL/min/1.73mˆ2 (ref >59)
GLOBULIN: 2.4 (ref 2.0–3.5)
GLUCOSE: 120 mg/dL — ABNORMAL HIGH (ref 74–109)
OSMOLALITY, CALCULATED: 284 mosm/kg (ref 270–290)
POTASSIUM: 4 mmol/L (ref 3.5–5.1)
PROTEIN TOTAL: 6.7 g/dL (ref 6.4–8.9)
SODIUM: 141 mmol/L (ref 136–145)

## 2024-01-05 LAB — CBC WITH DIFF
BASOPHIL #: 0.1 x10ˆ3/uL (ref 0.00–0.10)
BASOPHIL %: 1 % (ref 0–1)
EOSINOPHIL #: 0 x10ˆ3/uL (ref 0.00–0.50)
EOSINOPHIL %: 0 % — ABNORMAL LOW (ref 1–7)
HCT: 37.5 % (ref 31.2–41.9)
HGB: 13.1 g/dL (ref 10.9–14.3)
LYMPHOCYTE #: 2.2 x10ˆ3/uL (ref 1.10–3.10)
LYMPHOCYTE %: 29 % (ref 16–46)
MCH: 32 pg (ref 24.7–32.8)
MCHC: 35 g/dL (ref 32.3–35.6)
MCV: 91.3 fL (ref 75.5–95.3)
MONOCYTE #: 0.6 x10ˆ3/uL (ref 0.20–0.90)
MONOCYTE %: 8 % (ref 4–11)
MPV: 7.1 fL — ABNORMAL LOW (ref 7.9–10.8)
NEUTROPHIL #: 4.8 x10ˆ3/uL (ref 1.90–8.20)
NEUTROPHIL %: 62 % (ref 43–77)
PLATELETS: 198 x10ˆ3/uL (ref 140–440)
RBC: 4.1 x10ˆ6/uL (ref 3.63–4.92)
RDW: 14.7 % (ref 12.3–17.7)
WBC: 7.7 x10ˆ3/uL (ref 3.8–11.8)

## 2024-01-05 LAB — MAGNESIUM: MAGNESIUM: 1.9 mg/dL (ref 1.9–2.7)

## 2024-01-05 MED ORDER — HYDROCORTISONE SOD SUCCINATE 100 MG/2 ML VIAL WRAPPER: 100.0000 mg | Freq: Once | INTRAMUSCULAR | Status: DC | PRN

## 2024-01-05 MED ORDER — FAMOTIDINE (PF) 20 MG/2 ML INTRAVENOUS SOLUTION: 20.0000 mg | Freq: Once | INTRAVENOUS | Status: DC | PRN

## 2024-01-05 MED ORDER — SODIUM CHLORIDE 0.9% FLUSH BAG - 250 ML: INTRAVENOUS | Status: DC | PRN

## 2024-01-05 MED ORDER — MEPERIDINE (PF) 25 MG/ML INJECTION SOLUTION: 12.5000 mg | Freq: Once | INTRAMUSCULAR | Status: DC | PRN

## 2024-01-05 MED ORDER — ONDANSETRON HCL 8 MG TABLET: 8.0000 mg | ORAL_TABLET | Freq: Once | ORAL | Status: DC | PRN

## 2024-01-05 MED ORDER — EPINEPHRINE 1 MG/ML (1 ML) INJECTION SOLUTION: 0.3000 mg | Freq: Once | INTRAMUSCULAR | Status: DC | PRN

## 2024-01-05 MED ORDER — SODIUM CHLORIDE 0.9 % INTRAVENOUS SOLUTION
200.0000 mg | Freq: Once | INTRAVENOUS | Status: AC
  Administered 2024-01-05: 0 mg via INTRAVENOUS
  Administered 2024-01-05: 200 mg via INTRAVENOUS
  Filled 2024-01-05: qty 8

## 2024-01-05 MED ORDER — DIPHENHYDRAMINE 50 MG/ML INJECTION SOLUTION: 25.0000 mg | Freq: Once | INTRAMUSCULAR | Status: DC | PRN

## 2024-01-05 MED ORDER — DIPHENHYDRAMINE 50 MG/ML INJECTION SOLUTION: 50.0000 mg | Freq: Once | INTRAMUSCULAR | Status: DC | PRN

## 2024-01-05 MED ORDER — SODIUM CHLORIDE 0.9 % IV BOLUS: 500.0000 mL | INJECTION | Freq: Once | Status: DC | PRN

## 2024-01-05 MED ORDER — ONDANSETRON HCL (PF) 4 MG/2 ML INJECTION SOLUTION: 8.0000 mg | Freq: Once | INTRAMUSCULAR | Status: DC | PRN

## 2024-01-05 MED ORDER — ALBUTEROL SULFATE HFA 90 MCG/ACTUATION AEROSOL INHALER - RN: 2.0000 | Freq: Once | RESPIRATORY_TRACT | Status: DC | PRN

## 2024-01-05 MED ORDER — DEXTROSE 5% IN WATER (D5W) FLUSH BAG - 250 ML: INTRAVENOUS | Status: DC | PRN

## 2024-01-05 MED ORDER — ALBUTEROL SULFATE 2.5 MG/3 ML (0.083 %) SOLUTION FOR NEBULIZATION: 2.5000 mg | INHALATION_SOLUTION | Freq: Once | RESPIRATORY_TRACT | Status: DC | PRN

## 2024-01-05 NOTE — Progress Notes (Signed)
 Department of Hematology/Oncology  Progress Note   Name: Elizabeth [name]  FMW:[id number]  Date of Birth: [birth year]  Encounter Date: 01/05/2024    REFERRING PROVIDER:  Theo Krabbe, APRN, CNP  [address]  [place],  [place] [address]    TELEMEDICINE DOCUMENTATION:  Patient Location:  [facility] outpatient Hematology/Oncology [address] [place] [address]  Patient/family aware of provider location:  yes  Patient/family consent for telemedicine:  yes    REASON FOR OFFICE VISIT:  Renal Cell Cancer     HISTORY OF PRESENT ILLNESS:  Elizabeth [name] is a 53 y.o. 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  She was treated with surgery followed by temozolomide plus radiation.  The combination therapy was finished in October 2015.    Clinically, she is been doing well since that time.  She does have some aphasia and word-finding difficulty.    11/03/2023: The patient is here for follow up of renal cell carcinoma.  The lesion was T IIIb, with all margins negative.  No lymph nodes were seen in the sample.    12/15/2023: The patient is here for follow up of renal cell carcinoma and pulmonary emboli.  She has been on treatment with adjuvant pembrolizumab  and denies any new issues associated with that.  She has been on anticoagulation since June 11, at which time she was found to have segmental and subsegmental pulmonary emboli.  Because she is on phenytoin , she was not able to take an oral anticoagulant and has been on low-molecular weight heparin  injections.    01/05/2024: the patient is here for follow-up of renal cell carcinoma. Since the last visit, she states that she did have some tendinitis that was managed by her primary physician and she states that it has improved.    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    Chest pain, unspecified type 07/17/2023    CPAP (continuous positive airway pressure) dependence     CVA (cerebrovascular accident)     mini during brain surgery per pt    Diabetes mellitus, type 2     Diabetes mellitus, type 2     Disorder of thyroid      Dyspnea, unspecified type 07/17/2023    Epileptic seizure (CMS HCC)     Essential hypertension     GERD (gastroesophageal reflux disease) 08/02/2023    History of kidney surgery 06/2023    Pulmonary embolism 07/16/2023    Sleep apnea          Past Surgical History:   Procedure Laterality Date    BRAIN SURGERY      GASTROJEJUNOSTOMY W/ JEJUNOSTOMY TUBE      HX CHOLECYSTECTOMY      HX HERNIA REPAIR      KIDNEY SURGERY Left     removal         Social History     Socioeconomic History    Marital status: Divorced     Spouse name: Not on file    Number of children: Not on file    Years of education: Not on file    Highest education level: Not on file   Occupational History    Not on file  Tobacco Use    Smoking status: Never    Smokeless tobacco: Never   Vaping Use    Vaping status: Never Used   Substance and Sexual Activity    Alcohol  use: Never    Drug use: Never    Sexual activity: Not on file   Other Topics Concern    Ability to Walk 1 Flight of Steps without SOB/CP Yes    Routine Exercise No    Ability to Walk 2 Flight of Steps without SOB/CP No    Unable to Ambulate Not Asked    Total Care Not Asked    Ability To Do Own ADL's Yes    Uses Walker Not Asked    Other Activity Level Not Asked    Uses Cane Not Asked   Social History Narrative    Not on file     Social Determinants of Health     Financial Resource Strain: Low Risk (07/24/2023)    Financial Resource Strain     SDOH Financial: No   Transportation Needs: High Risk (07/24/2023)    Transportation Needs     SDOH Transportation: Yes, it has kept me from medical appointments or from getting my medications   Social Connections: Low Risk (07/24/2023)     Social Connections     SDOH Social Isolation: 5 or more times a week   Intimate Partner Violence: Not on file   Housing Stability: Low Risk (07/24/2023)    Housing Stability     SDOH Housing Situation: I have housing.     SDOH Housing Worry: No     Family Medical History:       Problem Relation (Age of Onset)    Arthritis-osteo Mother    Breast Cancer Paternal Grandmother    Heart Disease Father    Hypertension (High Blood Pressure) Mother    No Known Problems Sister, Brother, Maternal Grandmother, Maternal Grandfather, Paternal Grandfather, Daughter, Son, Maternal Aunt, Maternal Uncle, Paternal Aunt, Paternal Uncle, Other            Current Outpatient Medications   Medication Sig    ACCU-CHEK SOFTCLIX LANCETS Misc CHECK BLOOD SUGAR EVERY DAY AS DIRECTED    atorvastatin  (LIPITOR) 20 mg Oral Tablet Take 1 Tablet (20 mg total) by mouth Every night    ergocalciferol , vitamin D2, (DRISDOL ) 1,250 mcg (50,000 unit) Oral Capsule Take 1 Capsule (50,000 Units total) by mouth Every Sunday    escitalopram  oxalate (LEXAPRO ) 20 mg Oral Tablet Take 1 Tablet (20 mg total) by mouth Daily    famotidine  (PEPCID ) 20 mg Oral Tablet Take 1 Tablet (20 mg total) by mouth Daily    FREESTYLE LANCETS 28 gauge Misc TEST ONCE DAILY    FREESTYLE LITE METER Kit EVERY DAY    FREESTYLE LITE STRIPS Strip TEST EVERY DAY    hydroCHLOROthiazide  (HYDRODIURIL ) 25 mg Oral Tablet Take 1 Tablet (25 mg total) by mouth Every morning    levothyroxine  (SYNTHROID ) 25 mcg Oral Tablet TAKE 1 TABLET ONCE A DAY IN THE MORNING 30 MINUTES BEFORE MEALS OR ANY OTHER MEDICATIONS    losartan  (COZAAR ) 100 mg Oral Tablet Take 1 Tablet (100 mg total) by mouth Daily    magnesium  Oxide 420 mg Oral Tablet Take 1 Tablet (420 mg total) by mouth Daily    methocarbamoL  (ROBAXIN ) 500 mg Oral Tablet Take 1 Tablet (500 mg total) by mouth Four times a day as needed    ondansetron  (ZOFRAN  ODT) 4 mg Oral Tablet, Rapid Dissolve Take 1 Tablet (  4 mg total) by mouth Every 6 hours as needed  for Nausea/Vomiting Indications: prevent nausea and vomiting from cancer chemotherapy    ondansetron  (ZOFRAN  ODT) 4 mg Oral Tablet, Rapid Dissolve Take 1 Tablet (4 mg total) by mouth Every 8 hours as needed for Nausea/Vomiting    oxyCODONE  (ROXICODONE ) 5 mg Oral Tablet Take 1 Tablet (5 mg total) by mouth Every 6 hours as needed for Pain    phenytoin  sodium extended release (DILANTIN ) 100 mg Oral Capsule TAKE 3 CAPSULES BY MOUTH TWICE A DAY    predniSONE (DELTASONE) 10 mg Oral Tablet TAKE 6 TABLETS ON DAY 1 AS DIRECTED AND DECREASE BY 1 TAB EACH DAY FOR A TOTAL OF 6 DAYS    traMADoL  (ULTRAM ) 50 mg Oral Tablet Take 1 Tablet (50 mg total) by mouth Every 4 hours as needed for Pain     Allergies   Allergen Reactions    Ace Inhibitors      Other reaction(s): Angioedema       PHYSICAL EXAM:  Most Recent Vitals    Flowsheet Row Office Visit from 07/05/2021 in Hematology/Oncology, [facility]   Temperature 36.8 C (98.3 F) filed at... 07/05/2021 1548   Heart Rate 76 filed at... 07/05/2021 1548   Respiratory Rate --   BP (Non-Invasive) 158/70 filed at... 07/05/2021 1548   SpO2 --   Height 1.575 m (5' 2) filed at... 07/05/2021 1548   Weight 94.5 kg (208 lb 4.8 oz) filed at... 07/05/2021 1548   BMI (Calculated) 38.18 filed at... 07/05/2021 1548   BSA (Calculated) 2.03 filed at... 07/05/2021 1548      ECOG Status: (1) Restricted in physically strenuous activity, ambulatory and able to do work of light nature   Physical Exam  Constitutional:       General: She is not in acute distress.     Appearance: Normal appearance.   Eyes:      Extraocular Movements: Extraocular movements intact.   Cardiovascular:      Rate and Rhythm: Normal rate and regular rhythm.   Pulmonary:      Effort: Pulmonary effort is normal.   Abdominal:      General: Abdomen is flat.      Palpations: Abdomen is soft.   Musculoskeletal:         General: Normal range of motion.      Cervical back: Normal range of motion.   Skin:     General:  Skin is warm and dry.   Neurological:      General: No focal deficit present.      Mental Status: She is alert.   Psychiatric:         Mood and Affect: Mood normal.         DIAGNOSTIC DATA:  No results found for this or any previous visit (from the past 82479 hours).    LABS:   CBC  Diff   Lab Results   Component Value Date/Time    WBC 7.7 01/05/2024 01:11 PM    HGB 13.1 01/05/2024 01:11 PM    HCT 37.5 01/05/2024 01:11 PM    PLTCNT 198 01/05/2024 01:11 PM    RBC 4.10 01/05/2024 01:11 PM    MCV 91.3 01/05/2024 01:11 PM    MCHC 35.0 01/05/2024 01:11 PM    MCH 32.0 01/05/2024 01:11 PM    RDW 14.7 01/05/2024 01:11 PM    MPV 7.1 (L) 01/05/2024 01:11 PM    Lab Results   Component Value  Date/Time    PMNS 62 01/05/2024 01:11 PM    LYMPHOCYTES 29 01/05/2024 01:11 PM    EOSINOPHIL 0 (L) 01/05/2024 01:11 PM    MONOCYTES 8 01/05/2024 01:11 PM    BASOPHILS 1 01/05/2024 01:11 PM    BASOPHILS 0.10 01/05/2024 01:11 PM    PMNABS 4.80 01/05/2024 01:11 PM    LYMPHSABS 2.20 01/05/2024 01:11 PM    EOSABS 0.00 01/05/2024 01:11 PM    MONOSABS 0.60 01/05/2024 01:11 PM            Comprehensive Metabolic Profile    Lab Results   Component Value Date    SODIUM 141 01/05/2024    POTASSIUM 4.0 01/05/2024    CHLORIDE 106 01/05/2024    CO2 27 01/05/2024    ANIONGAP 8 01/05/2024    BUN 17 01/05/2024    CREATININE 0.91 01/05/2024    ALBUMIN  4.3 01/05/2024    CALCIUM  9.2 01/05/2024    GLUCOSENF 120 (H) 01/05/2024    ALKPHOS 96 01/05/2024    ALT 19 01/05/2024    AST 12 (L) 01/05/2024    TOTBILIRUBIN 0.3 01/05/2024    TOTALPROTEIN 6.7 01/05/2024          BASIC METABOLIC PANEL  Lab Results   Component Value Date    SODIUM 141 01/05/2024    POTASSIUM 4.0 01/05/2024    CHLORIDE 106 01/05/2024    CO2 27 01/05/2024    ANIONGAP 8 01/05/2024    BUN 17 01/05/2024    CREATININE 0.91 01/05/2024    BUNCRRATIO 19 01/05/2024    GFR 75 01/05/2024    CALCIUM  9.2 01/05/2024    GLUCOSENF 120 (H) 01/05/2024           ASSESSMENT:  Problem List Items Addressed This Visit           Oncology    Clear cell renal cell carcinoma, left (CMS HCC) - Primary       Cardiovascular System    Pulmonary embolism     Other Visit Diagnoses         Malignant neoplasm of kidney, left (CMS HCC)                 ICD-10-CM    1. Clear cell renal cell carcinoma, left (CMS HCC)  C64.2       2. Malignant neoplasm of kidney, left (CMS HCC)  C64.2       3. Pulmonary embolism, unspecified chronicity, unspecified pulmonary embolism type, unspecified whether acute cor pulmonale present (CMS HCC)  I26.99            PLAN:   1. All relevant medical records were reviewed including available pertinent provider notes, procedure notes, imaging, laboratory, and pathology.   2. All pertinent labs and/or imaging were reviewed with the patient.   3. Anaplastic astrocytoma:  The patient received definitive treat 8 years ago and has been stable since that time.  For this issue, we will see her back every 6 months for ongoing surveillance.  4. Renal cell carcinoma: The patient had a T IIIb lesion.  It appears to have been completely resected, but she is at risk for recurrence and adjuvant immunotherapy is indicated.  Continue adjuvant pembrolizumab  to complete 12 months of therapy.  5. Pulmonary emboli:  She had relatively low volume pulmonary emboli in June, and was treated with anticoagulation for a period of five months.    Elizabeth [name] was given the chance to ask questions, and these were answered to their  satisfaction. The patient is welcome to call with any questions or concerns in the meantime.     On the day of the encounter, a total of 35 minutes was spent on this patient encounter including review of historical information, examination, documentation and post-visit activities.   Return in about 3 weeks (around 01/26/2024).     Lynwood Pipes, MD  01/05/2024 , 13:55    The patient was seen as part of a collaborative telemedicine service with Dr. Pipes who participated in the encounter by active presence via approved  video/audio means for portions of the encounter.    The patient's insurance company bears full legal and financial responsibility resulting from any deviations that they cause to my recommended treatment plan.    CC:  Corean Lamer, APRN, CNP  [address]  [place] [address]    Lamer Corean, APRN, CNP  [address]  [place],  [place] [address]    This note was partially generated using MModal Fluency Direct system, and there may be some incorrect words, spellings, and punctuation that were not noted in checking the note before saving.

## 2024-01-05 NOTE — Nurses Notes (Signed)
 1404: Patient came in via w/c with family for treatment after seeing Dr. Marcey at the Oncology clinic. Madelin Clay, RN  1420:Right Port accessed per protocol. Meds released.Madelin Clay, RN  [phone]: Keytruda  given over 30 minutes. Tolerated well. Madelin Clay, RN  [phone]: Port flushed and deaccessed. Site clear.VSS.Madelin Clay, RN  [phone]: Left  unit via w/c with family.  No new distress reported.Madelin Clay, RN

## 2024-01-05 NOTE — Nursing Note (Signed)
 Patient states only problem with keytruda  is nausea. All questions answered. Went over labs. Continue current treatment. Return in 3 weeks.

## 2024-01-26 ENCOUNTER — Encounter (INDEPENDENT_AMBULATORY_CARE_PROVIDER_SITE_OTHER): Payer: Self-pay | Admitting: HEMATOLOGY-ONCOLOGY

## 2024-01-26 ENCOUNTER — Ambulatory Visit
Admission: RE | Admit: 2024-01-26 | Discharge: 2024-01-26 | Disposition: A | Discharge status: Home / Self Care | Source: Ambulatory Visit | Attending: HEMATOLOGY-ONCOLOGY | Admitting: HEMATOLOGY-ONCOLOGY

## 2024-01-26 ENCOUNTER — Other Ambulatory Visit (INDEPENDENT_AMBULATORY_CARE_PROVIDER_SITE_OTHER): Payer: Self-pay | Admitting: HEMATOLOGY-ONCOLOGY

## 2024-01-26 ENCOUNTER — Ambulatory Visit (INDEPENDENT_AMBULATORY_CARE_PROVIDER_SITE_OTHER): Payer: Self-pay | Admitting: HEMATOLOGY-ONCOLOGY

## 2024-01-26 ENCOUNTER — Ambulatory Visit (INDEPENDENT_AMBULATORY_CARE_PROVIDER_SITE_OTHER)
Admission: RE | Admit: 2024-01-26 | Discharge: 2024-01-26 | Disposition: A | Discharge status: Home / Self Care | Payer: Self-pay | Source: Ambulatory Visit | Attending: HEMATOLOGY-ONCOLOGY | Admitting: HEMATOLOGY-ONCOLOGY

## 2024-01-26 ENCOUNTER — Other Ambulatory Visit: Payer: Self-pay

## 2024-01-26 ENCOUNTER — Encounter (HOSPITAL_COMMUNITY): Payer: Self-pay

## 2024-01-26 VITALS — BP 156/84 | HR 70 | Temp 97.4°F | Resp 20

## 2024-01-26 VITALS — BP 141/70 | HR 85 | Temp 97.3°F | Ht 62.0 in | Wt 209.7 lb

## 2024-01-26 DIAGNOSIS — C642 Malignant neoplasm of left kidney, except renal pelvis: Secondary | ICD-10-CM

## 2024-01-26 DIAGNOSIS — Z9889 Other specified postprocedural states: Secondary | ICD-10-CM | POA: Insufficient documentation

## 2024-01-26 DIAGNOSIS — Z85841 Personal history of malignant neoplasm of brain: Secondary | ICD-10-CM | POA: Insufficient documentation

## 2024-01-26 DIAGNOSIS — Z9229 Personal history of other drug therapy: Secondary | ICD-10-CM | POA: Insufficient documentation

## 2024-01-26 DIAGNOSIS — Z79620 Long term (current) use of immunosuppressive biologic: Secondary | ICD-10-CM | POA: Insufficient documentation

## 2024-01-26 DIAGNOSIS — R4701 Aphasia: Secondary | ICD-10-CM | POA: Insufficient documentation

## 2024-01-26 DIAGNOSIS — Z86711 Personal history of pulmonary embolism: Secondary | ICD-10-CM | POA: Insufficient documentation

## 2024-01-26 DIAGNOSIS — Z5112 Encounter for antineoplastic immunotherapy: Secondary | ICD-10-CM | POA: Insufficient documentation

## 2024-01-26 LAB — CBC WITH DIFF
BASOPHIL #: 0.1 x10ˆ3/uL (ref 0.00–0.10)
BASOPHIL %: 1 % (ref 0–1)
EOSINOPHIL #: 0 x10ˆ3/uL (ref 0.00–0.50)
EOSINOPHIL %: 0 % — ABNORMAL LOW (ref 1–7)
HCT: 36.2 % (ref 31.2–41.9)
HGB: 12.9 g/dL (ref 10.9–14.3)
LYMPHOCYTE #: 2 x10ˆ3/uL (ref 1.10–3.10)
LYMPHOCYTE %: 32 % (ref 16–46)
MCH: 32.8 pg (ref 24.7–32.8)
MCHC: 35.6 g/dL (ref 32.3–35.6)
MCV: 92.1 fL (ref 75.5–95.3)
MONOCYTE #: 0.5 x10ˆ3/uL (ref 0.20–0.90)
MONOCYTE %: 7 % (ref 4–11)
MPV: 7.2 fL — ABNORMAL LOW (ref 7.9–10.8)
NEUTROPHIL #: 3.7 x10ˆ3/uL (ref 1.90–8.20)
NEUTROPHIL %: 60 % (ref 43–77)
PLATELETS: 207 x10ˆ3/uL (ref 140–440)
RBC: 3.93 x10ˆ6/uL (ref 3.63–4.92)
RDW: 13.7 % (ref 12.3–17.7)
WBC: 6.2 x10ˆ3/uL (ref 3.8–11.8)

## 2024-01-26 LAB — COMPREHENSIVE METABOLIC PANEL, NON-FASTING
ALBUMIN/GLOBULIN RATIO: 1.9 — ABNORMAL HIGH (ref 0.8–1.4)
ALBUMIN: 4.2 g/dL (ref 3.5–5.7)
ALKALINE PHOSPHATASE: 107 U/L — ABNORMAL HIGH (ref 34–104)
ALT (SGPT): 20 U/L (ref 7–52)
ANION GAP: 10 mmol/L (ref 4–13)
AST (SGOT): 11 U/L — ABNORMAL LOW (ref 13–39)
BILIRUBIN TOTAL: 0.3 mg/dL (ref 0.3–1.0)
BUN/CREA RATIO: 20 (ref 6–22)
BUN: 15 mg/dL (ref 7–25)
CALCIUM, CORRECTED: 8.5 mg/dL — ABNORMAL LOW (ref 8.9–10.8)
CALCIUM: 8.7 mg/dL (ref 8.6–10.3)
CHLORIDE: 111 mmol/L — ABNORMAL HIGH (ref 98–107)
CO2 TOTAL: 21 mmol/L (ref 21–31)
CREATININE: 0.74 mg/dL (ref 0.60–1.30)
ESTIMATED GFR: 95 mL/min/1.73mˆ2 (ref >59)
GLOBULIN: 2.2 (ref 2.0–3.5)
GLUCOSE: 155 mg/dL — ABNORMAL HIGH (ref 74–109)
OSMOLALITY, CALCULATED: 287 mosm/kg (ref 270–290)
POTASSIUM: 3.9 mmol/L (ref 3.5–5.1)
PROTEIN TOTAL: 6.4 g/dL (ref 6.4–8.9)
SODIUM: 142 mmol/L (ref 136–145)

## 2024-01-26 LAB — THYROID STIMULATING HORMONE WITH FREE T4 REFLEX: TSH: 1.615 u[IU]/mL (ref 0.450–5.330)

## 2024-01-26 LAB — MAGNESIUM: MAGNESIUM: 1.9 mg/dL (ref 1.9–2.7)

## 2024-01-26 MED ORDER — DIPHENHYDRAMINE 50 MG/ML INJECTION SOLUTION: 50.0000 mg | Freq: Once | INTRAMUSCULAR | Status: DC | PRN

## 2024-01-26 MED ORDER — SODIUM CHLORIDE 0.9% FLUSH BAG - 250 ML: INTRAVENOUS | Status: DC | PRN

## 2024-01-26 MED ORDER — EPINEPHRINE 1 MG/ML (1 ML) INJECTION SOLUTION: 0.3000 mg | Freq: Once | INTRAMUSCULAR | Status: DC | PRN

## 2024-01-26 MED ORDER — DIPHENHYDRAMINE 50 MG/ML INJECTION SOLUTION: 25.0000 mg | Freq: Once | INTRAMUSCULAR | Status: DC | PRN

## 2024-01-26 MED ORDER — ALBUTEROL SULFATE HFA 90 MCG/ACTUATION AEROSOL INHALER - RN: 2.0000 | Freq: Once | RESPIRATORY_TRACT | Status: DC | PRN

## 2024-01-26 MED ORDER — FAMOTIDINE (PF) 20 MG/2 ML INTRAVENOUS SOLUTION: 20.0000 mg | Freq: Once | INTRAVENOUS | Status: DC | PRN

## 2024-01-26 MED ORDER — SODIUM CHLORIDE 0.9 % IV BOLUS: 500.0000 mL | INJECTION | Freq: Once | Status: DC | PRN

## 2024-01-26 MED ORDER — ONDANSETRON HCL 8 MG TABLET: 8.0000 mg | ORAL_TABLET | Freq: Once | ORAL | Status: DC | PRN

## 2024-01-26 MED ORDER — MEPERIDINE (PF) 25 MG/ML INJECTION SOLUTION: 12.5000 mg | Freq: Once | INTRAMUSCULAR | Status: DC | PRN

## 2024-01-26 MED ORDER — ALBUTEROL SULFATE 2.5 MG/3 ML (0.083 %) SOLUTION FOR NEBULIZATION: 2.5000 mg | INHALATION_SOLUTION | Freq: Once | RESPIRATORY_TRACT | Status: DC | PRN

## 2024-01-26 MED ORDER — SODIUM CHLORIDE 0.9 % INTRAVENOUS SOLUTION
200.0000 mg | Freq: Once | INTRAVENOUS | Status: AC
  Administered 2024-01-26: 0 mg via INTRAVENOUS
  Administered 2024-01-26: 200 mg via INTRAVENOUS
  Filled 2024-01-26: qty 8

## 2024-01-26 MED ORDER — DEXTROSE 5% IN WATER (D5W) FLUSH BAG - 250 ML: INTRAVENOUS | Status: DC | PRN

## 2024-01-26 MED ORDER — HYDROCORTISONE SOD SUCCINATE 100 MG/2 ML VIAL WRAPPER: 100.0000 mg | Freq: Once | INTRAMUSCULAR | Status: DC | PRN

## 2024-01-26 MED ORDER — ONDANSETRON HCL (PF) 4 MG/2 ML INJECTION SOLUTION: 8.0000 mg | Freq: Once | INTRAMUSCULAR | Status: DC | PRN

## 2024-01-26 NOTE — Nurses Notes (Signed)
 1145 - Pt arrived to OP ONC ambulatory to room for treatment, assessment complete, VS/S, waiting on labs to result. Jonette Kitty, RN  1224 - Labs resulted and pt meets parameters to have treatment, Port accessed by Sharlet Montclair, RN, blood return present, flushed without difficulty. Jonette Kitty, RN  1254 - Keytruda  200 mg infusion started at this time, will continue to monitor. Jonette Kitty, RN  1324 - Infusion complete, VS/S, line flushing at this time. Jonette Kitty, RN  [phone] - Line flushed without difficulty, blood return present at port, port flushed without difficulty, port de accessed, pressure dressing applied. Jonette Kitty, RN  [phone] - Pt left unit via wheelchair accompanied by family member without complaints. Jonette Kitty, RN

## 2024-01-26 NOTE — Nursing Note (Signed)
 Patient's only complaint is shoulders hurting. Went over xray. All questions answers. Continue current treatment. Patient can take ozempic . Return in 3 months.

## 2024-01-26 NOTE — Progress Notes (Signed)
 Department of Hematology/Oncology  Progress Note   Name: Elizabeth [name]  FMW:[id number]  Date of Birth: 1971/01/11  Encounter Date: 01/26/2024    REFERRING PROVIDER:  Theo Krabbe, APRN, CNP  [address]  [place],  [place] [address]    REASON FOR OFFICE VISIT:  Renal Cell Cancer     HISTORY OF PRESENT ILLNESS:  Elizabeth [name] is a 53 y.o. 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  She was treated with surgery followed by temozolomide plus radiation.  The combination therapy was finished in October 2015.    Clinically, she is been doing well since that time.  She does have some aphasia and word-finding difficulty.    11/03/2023: The patient is here for follow up of renal cell carcinoma.  The lesion was T IIIb, with all margins negative.  No lymph nodes were seen in the sample.    12/15/2023: The patient is here for follow up of renal cell carcinoma and pulmonary emboli.  She has been on treatment with adjuvant pembrolizumab  and denies any new issues associated with that.  She has been on anticoagulation since June 11, at which time she was found to have segmental and subsegmental pulmonary emboli.  Because she is on phenytoin , she was not able to take an oral anticoagulant and has been on low-molecular weight heparin  injections.    01/05/2024: the patient is here for follow-up of renal cell carcinoma. Since the last visit, she states that she did have some tendinitis that was managed by her primary physician and she states that it has improved.    01/26/2024: The patient is here for follow up of renal cell carcinoma.  She states that she has been tolerating the treatments without significant issues.  She did have some shoulder pain but x-rays showed no issues.    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    Chest pain, unspecified type 07/17/2023    CPAP (continuous positive airway pressure) dependence     CVA (cerebrovascular accident)     mini during brain surgery per pt    Diabetes mellitus, type 2     Diabetes mellitus, type 2     Disorder of thyroid      Dyspnea, unspecified type 07/17/2023    Epileptic seizure (CMS HCC)     Essential hypertension     GERD (gastroesophageal reflux disease) 08/02/2023    History of kidney surgery 06/2023    Pulmonary embolism 07/16/2023    Sleep apnea          Past Surgical History:   Procedure Laterality Date    BRAIN SURGERY      GASTROJEJUNOSTOMY W/ JEJUNOSTOMY TUBE      HX CHOLECYSTECTOMY      HX HERNIA REPAIR      KIDNEY SURGERY Left     removal         Social History     Socioeconomic History    Marital status: Divorced     Spouse name: Not on file    Number of children: Not on file    Years of education: Not on file    Highest education level: Not on file   Occupational History  Not on file   Tobacco Use    Smoking status: Never    Smokeless tobacco: Never   Vaping Use    Vaping status: Never Used   Substance and Sexual Activity    Alcohol  use: Never    Drug use: Never    Sexual activity: Not Currently   Other Topics Concern    Ability to Walk 1 Flight of Steps without SOB/CP Yes    Routine Exercise No    Ability to Walk 2 Flight of Steps without SOB/CP No    Unable to Ambulate Not Asked    Total Care Not Asked    Ability To Do Own ADL's Yes    Uses Walker Not Asked    Other Activity Level Not Asked    Uses Cane Not Asked   Social History Narrative    Not on file     Social Determinants of Health     Financial Resource Strain: Low Risk (07/24/2023)    Financial Resource Strain     SDOH Financial: No   Transportation Needs: High Risk (07/24/2023)    Transportation Needs     SDOH Transportation: Yes, it has kept me from medical appointments or from getting my medications   Social Connections: Low Risk (07/24/2023)    Social Connections     SDOH  Social Isolation: 5 or more times a week   Intimate Partner Violence: Not on file   Housing Stability: Low Risk (07/24/2023)    Housing Stability     SDOH Housing Situation: I have housing.     SDOH Housing Worry: No     Family Medical History:       Problem Relation (Age of Onset)    Arthritis-osteo Mother    Breast Cancer Paternal Grandmother    Heart Disease Father    Hypertension (High Blood Pressure) Mother    No Known Problems Sister, Brother, Maternal Grandmother, Maternal Grandfather, Paternal Grandfather, Daughter, Son, Maternal Aunt, Maternal Uncle, Paternal Aunt, Paternal Uncle, Other            Current Outpatient Medications   Medication Sig    ACCU-CHEK SOFTCLIX LANCETS Misc CHECK BLOOD SUGAR EVERY DAY AS DIRECTED    atorvastatin  (LIPITOR) 20 mg Oral Tablet Take 1 Tablet (20 mg total) by mouth Every night    ergocalciferol , vitamin D2, (DRISDOL ) 1,250 mcg (50,000 unit) Oral Capsule Take 1 Capsule (50,000 Units total) by mouth Every Sunday    escitalopram  oxalate (LEXAPRO ) 20 mg Oral Tablet Take 1 Tablet (20 mg total) by mouth Daily    famotidine  (PEPCID ) 20 mg Oral Tablet Take 1 Tablet (20 mg total) by mouth Daily    FREESTYLE LANCETS 28 gauge Misc TEST ONCE DAILY    FREESTYLE LITE METER Kit EVERY DAY    FREESTYLE LITE STRIPS Strip TEST EVERY DAY    hydroCHLOROthiazide  (HYDRODIURIL ) 25 mg Oral Tablet Take 1 Tablet (25 mg total) by mouth Every morning    levothyroxine  (SYNTHROID ) 25 mcg Oral Tablet TAKE 1 TABLET ONCE A DAY IN THE MORNING 30 MINUTES BEFORE MEALS OR ANY OTHER MEDICATIONS    losartan  (COZAAR ) 100 mg Oral Tablet Take 1 Tablet (100 mg total) by mouth Daily    magnesium  Oxide 420 mg Oral Tablet Take 1 Tablet (420 mg total) by mouth Daily    methocarbamoL  (ROBAXIN ) 500 mg Oral Tablet Take 1 Tablet (500 mg total) by mouth Four times a day as needed    ondansetron  (ZOFRAN  ODT) 4 mg Oral Tablet, Rapid  Dissolve Take 1 Tablet (4 mg total) by mouth Every 6 hours as needed for Nausea/Vomiting  Indications: prevent nausea and vomiting from cancer chemotherapy    oxyCODONE  (ROXICODONE ) 5 mg Oral Tablet Take 1 Tablet (5 mg total) by mouth Every 6 hours as needed for Pain    phenytoin  sodium extended release (DILANTIN ) 100 mg Oral Capsule TAKE 3 CAPSULES BY MOUTH TWICE A DAY    traMADoL  (ULTRAM ) 50 mg Oral Tablet Take 1 Tablet (50 mg total) by mouth Every 4 hours as needed for Pain     Allergies   Allergen Reactions    Ace Inhibitors      Other reaction(s): Angioedema       PHYSICAL EXAM:  Most Recent Vitals    Flowsheet Row Office Visit from 07/05/2021 in Hematology[facility]Oncology, [facility]   Temperature 36.8 C (98.3 F) filed at... 07/05/2021 1548   Heart Rate 76 filed at... 07/05/2021 1548   Respiratory Rate --   BP (Non-Invasive) 158/70 filed at... 07/05/2021 1548   SpO2 --   Height 1.575 m (5' 2) filed at... 07/05/2021 1548   Weight 94.5 kg (208 lb 4.8 oz) filed at... 07/05/2021 1548   BMI (Calculated) 38.18 filed at... 07/05/2021 1548   BSA (Calculated) 2.03 filed at... 07/05/2021 1548      ECOG Status: (1) Restricted in physically strenuous activity, ambulatory and able to do work of light nature   Physical Exam  Constitutional:       General: She is not in acute distress.     Appearance: Normal appearance.   Eyes:      Extraocular Movements: Extraocular movements intact.   Cardiovascular:      Rate and Rhythm: Normal rate and regular rhythm.   Pulmonary:      Effort: Pulmonary effort is normal.   Abdominal:      General: Abdomen is flat.      Palpations: Abdomen is soft.   Musculoskeletal:         General: Normal range of motion.      Cervical back: Normal range of motion.   Skin:     General: Skin is warm and dry.   Neurological:      General: No focal deficit present.      Mental Status: She is alert.   Psychiatric:         Mood and Affect: Mood normal.         DIAGNOSTIC DATA:  No results found for this or any previous visit (from the past 82479 hours).    LABS:   CBC  Diff   Lab  Results   Component Value Date/Time    WBC 6.2 01/26/2024 11:19 AM    HGB 12.9 01/26/2024 11:19 AM    HCT 36.2 01/26/2024 11:19 AM    PLTCNT 207 01/26/2024 11:19 AM    RBC 3.93 01/26/2024 11:19 AM    MCV 92.1 01/26/2024 11:19 AM    MCHC 35.6 01/26/2024 11:19 AM    MCH 32.8 01/26/2024 11:19 AM    RDW 13.7 01/26/2024 11:19 AM    MPV 7.2 (L) 01/26/2024 11:19 AM    Lab Results   Component Value Date/Time    PMNS 60 01/26/2024 11:19 AM    LYMPHOCYTES 32 01/26/2024 11:19 AM    EOSINOPHIL 0 (L) 01/26/2024 11:19 AM    MONOCYTES 7 01/26/2024 11:19 AM    BASOPHILS 1 01/26/2024 11:19 AM    BASOPHILS 0.10 01/26/2024 11:19 AM  PMNABS 3.70 01/26/2024 11:19 AM    LYMPHSABS 2.00 01/26/2024 11:19 AM    EOSABS 0.00 01/26/2024 11:19 AM    MONOSABS 0.50 01/26/2024 11:19 AM            Comprehensive Metabolic Profile    Lab Results   Component Value Date    SODIUM 142 01/26/2024    POTASSIUM 3.9 01/26/2024    CHLORIDE 111 (H) 01/26/2024    CO2 21 01/26/2024    ANIONGAP 10 01/26/2024    BUN 15 01/26/2024    CREATININE 0.74 01/26/2024    ALBUMIN  4.2 01/26/2024    CALCIUM  8.7 01/26/2024    GLUCOSENF 155 (H) 01/26/2024    ALKPHOS 107 (H) 01/26/2024    ALT 20 01/26/2024    AST 11 (L) 01/26/2024    TOTBILIRUBIN 0.3 01/26/2024    TOTALPROTEIN 6.4 01/26/2024          BASIC METABOLIC PANEL  Lab Results   Component Value Date    SODIUM 142 01/26/2024    POTASSIUM 3.9 01/26/2024    CHLORIDE 111 (H) 01/26/2024    CO2 21 01/26/2024    ANIONGAP 10 01/26/2024    BUN 15 01/26/2024    CREATININE 0.74 01/26/2024    BUNCRRATIO 20 01/26/2024    GFR 95 01/26/2024    CALCIUM  8.7 01/26/2024    GLUCOSENF 155 (H) 01/26/2024           ASSESSMENT:  Problem List Items Addressed This Visit    None       No diagnosis found.       PLAN:   1. All relevant medical records were reviewed including available pertinent provider notes, procedure notes, imaging, laboratory, and pathology.   2. All pertinent labs and/or imaging were reviewed with the patient.   3. Anaplastic  astrocytoma:  The patient received definitive treat 8 years ago and has been stable since that time.  For this issue, we will see her back every 6 months for ongoing surveillance.  4. Renal cell carcinoma: The patient had a T IIIb lesion.  It appears to have been completely resected, but she is at risk for recurrence and adjuvant immunotherapy is indicated.  Continue adjuvant pembrolizumab  to complete 12 months of therapy.  5. Pulmonary emboli:  She had relatively low volume pulmonary emboli in June, and was treated with anticoagulation for a period of five months.    Lillyann Stege was given the chance to ask questions, and these were answered to their satisfaction. The patient is welcome to call with any questions or concerns in the meantime.     On the day of the encounter, a total of 35 minutes was spent on this patient encounter including review of historical information, examination, documentation and post-visit activities.   Return in about 3 weeks (around 02/16/2024).     Lynwood Pipes, MD  01/26/2024 , 14:04    The patient's insurance company bears full legal and financial responsibility resulting from any deviations that they cause to my recommended treatment plan.    CC:  Corean Lamer, APRN, CNP  [address]  [place] [address]    Lamer Corean, APRN, CNP  [address]  [place],  [place] [address]    This note was partially generated using M[employer] Fluency Direct system, and there may be some incorrect words, spellings, and punctuation that were not noted in checking the note before saving.

## 2024-02-16 ENCOUNTER — Encounter (HOSPITAL_COMMUNITY): Payer: Self-pay

## 2024-02-16 ENCOUNTER — Ambulatory Visit (INDEPENDENT_AMBULATORY_CARE_PROVIDER_SITE_OTHER): Payer: Self-pay | Admitting: NURSE PRACTITIONER

## 2024-02-16 ENCOUNTER — Encounter (INDEPENDENT_AMBULATORY_CARE_PROVIDER_SITE_OTHER): Payer: Self-pay | Admitting: NURSE PRACTITIONER

## 2024-02-16 ENCOUNTER — Ambulatory Visit (INDEPENDENT_AMBULATORY_CARE_PROVIDER_SITE_OTHER): Admission: RE | Admit: 2024-02-16 | Discharge: 2024-02-16 | Attending: NURSE PRACTITIONER

## 2024-02-16 ENCOUNTER — Other Ambulatory Visit (INDEPENDENT_AMBULATORY_CARE_PROVIDER_SITE_OTHER): Payer: Self-pay | Admitting: HEMATOLOGY-ONCOLOGY

## 2024-02-16 ENCOUNTER — Other Ambulatory Visit: Payer: Self-pay

## 2024-02-16 ENCOUNTER — Ambulatory Visit
Admission: RE | Admit: 2024-02-16 | Discharge: 2024-02-16 | Disposition: A | Payer: Self-pay | Source: Ambulatory Visit | Attending: NURSE PRACTITIONER

## 2024-02-16 VITALS — BP 151/72 | HR 81 | Temp 98.0°F | Resp 18

## 2024-02-16 VITALS — BP 134/64 | HR 88 | Temp 97.7°F | Ht 62.0 in | Wt 205.8 lb

## 2024-02-16 DIAGNOSIS — Z905 Acquired absence of kidney: Secondary | ICD-10-CM | POA: Insufficient documentation

## 2024-02-16 DIAGNOSIS — C642 Malignant neoplasm of left kidney, except renal pelvis: Secondary | ICD-10-CM

## 2024-02-16 DIAGNOSIS — G4730 Sleep apnea, unspecified: Secondary | ICD-10-CM | POA: Insufficient documentation

## 2024-02-16 DIAGNOSIS — Z9889 Other specified postprocedural states: Secondary | ICD-10-CM | POA: Insufficient documentation

## 2024-02-16 DIAGNOSIS — I69820 Aphasia following other cerebrovascular disease: Secondary | ICD-10-CM | POA: Insufficient documentation

## 2024-02-16 DIAGNOSIS — Z923 Personal history of irradiation: Secondary | ICD-10-CM | POA: Insufficient documentation

## 2024-02-16 DIAGNOSIS — Z79890 Hormone replacement therapy (postmenopausal): Secondary | ICD-10-CM | POA: Insufficient documentation

## 2024-02-16 DIAGNOSIS — M25519 Pain in unspecified shoulder: Secondary | ICD-10-CM

## 2024-02-16 DIAGNOSIS — Z85841 Personal history of malignant neoplasm of brain: Secondary | ICD-10-CM | POA: Insufficient documentation

## 2024-02-16 DIAGNOSIS — Z79620 Long term (current) use of immunosuppressive biologic: Secondary | ICD-10-CM | POA: Insufficient documentation

## 2024-02-16 DIAGNOSIS — M25511 Pain in right shoulder: Secondary | ICD-10-CM | POA: Insufficient documentation

## 2024-02-16 DIAGNOSIS — I2699 Other pulmonary embolism without acute cor pulmonale: Secondary | ICD-10-CM

## 2024-02-16 DIAGNOSIS — Z9221 Personal history of antineoplastic chemotherapy: Secondary | ICD-10-CM | POA: Insufficient documentation

## 2024-02-16 DIAGNOSIS — Z5112 Encounter for antineoplastic immunotherapy: Secondary | ICD-10-CM | POA: Insufficient documentation

## 2024-02-16 LAB — COMPREHENSIVE METABOLIC PANEL, NON-FASTING
ALBUMIN/GLOBULIN RATIO: 1.8 — ABNORMAL HIGH (ref 0.8–1.4)
ALBUMIN: 4.4 g/dL (ref 3.5–5.7)
ALKALINE PHOSPHATASE: 123 U/L — ABNORMAL HIGH (ref 34–104)
ALT (SGPT): 24 U/L (ref 7–52)
ANION GAP: 8 mmol/L (ref 4–13)
AST (SGOT): 20 U/L (ref 13–39)
BILIRUBIN TOTAL: 0.4 mg/dL (ref 0.3–1.0)
BUN/CREA RATIO: 16 (ref 6–22)
BUN: 13 mg/dL (ref 7–25)
CALCIUM, CORRECTED: 8.8 mg/dL — ABNORMAL LOW (ref 8.9–10.8)
CALCIUM: 9.1 mg/dL (ref 8.6–10.3)
CHLORIDE: 103 mmol/L (ref 98–107)
CO2 TOTAL: 29 mmol/L (ref 21–31)
CREATININE: 0.82 mg/dL (ref 0.60–1.30)
ESTIMATED GFR: 84 mL/min/1.73mˆ2 (ref >59)
GLOBULIN: 2.4 (ref 2.0–3.5)
GLUCOSE: 114 mg/dL — ABNORMAL HIGH (ref 74–109)
OSMOLALITY, CALCULATED: 280 mosm/kg (ref 270–290)
POTASSIUM: 4 mmol/L (ref 3.5–5.1)
PROTEIN TOTAL: 6.8 g/dL (ref 6.4–8.9)
SODIUM: 140 mmol/L (ref 136–145)

## 2024-02-16 LAB — CBC WITH DIFF
BASOPHIL #: 0 x10ˆ3/uL (ref 0.00–0.10)
BASOPHIL %: 1 % (ref 0–1)
EOSINOPHIL #: 0.2 x10ˆ3/uL (ref 0.00–0.50)
EOSINOPHIL %: 3 % (ref 1–7)
HCT: 37.6 % (ref 31.2–41.9)
HGB: 13.3 g/dL (ref 10.9–14.3)
LYMPHOCYTE #: 2.1 x10ˆ3/uL (ref 1.10–3.10)
LYMPHOCYTE %: 28 % (ref 16–46)
MCH: 32.5 pg (ref 24.7–32.8)
MCHC: 35.3 g/dL (ref 32.3–35.6)
MCV: 92 fL (ref 75.5–95.3)
MONOCYTE #: 0.5 x10ˆ3/uL (ref 0.20–0.90)
MONOCYTE %: 7 % (ref 4–11)
MPV: 7.1 fL — ABNORMAL LOW (ref 7.9–10.8)
NEUTROPHIL #: 4.8 x10ˆ3/uL (ref 1.90–8.20)
NEUTROPHIL %: 62 % (ref 43–77)
PLATELETS: 226 x10ˆ3/uL (ref 140–440)
RBC: 4.09 x10ˆ6/uL (ref 3.63–4.92)
RDW: 13.6 % (ref 12.3–17.7)
WBC: 7.7 x10ˆ3/uL (ref 3.8–11.8)

## 2024-02-16 LAB — MAGNESIUM: MAGNESIUM: 2 mg/dL (ref 1.9–2.7)

## 2024-02-16 LAB — THYROID STIMULATING HORMONE WITH FREE T4 REFLEX: TSH: 2.122 u[IU]/mL (ref 0.450–5.330)

## 2024-02-16 MED ORDER — DIPHENHYDRAMINE 50 MG/ML INJECTION SOLUTION: 25.0000 mg | Freq: Once | INTRAMUSCULAR | Status: DC | PRN

## 2024-02-16 MED ORDER — DEXTROSE 5% IN WATER (D5W) FLUSH BAG - 250 ML: INTRAVENOUS | Status: DC | PRN

## 2024-02-16 MED ORDER — DIPHENHYDRAMINE 50 MG/ML INJECTION SOLUTION: 50.0000 mg | Freq: Once | INTRAMUSCULAR | Status: DC | PRN

## 2024-02-16 MED ORDER — SODIUM CHLORIDE 0.9 % IV BOLUS: 500.0000 mL | INJECTION | Freq: Once | Status: DC | PRN

## 2024-02-16 MED ORDER — ALBUTEROL SULFATE HFA 90 MCG/ACTUATION AEROSOL INHALER - RN: 2.0000 | Freq: Once | RESPIRATORY_TRACT | Status: DC | PRN

## 2024-02-16 MED ORDER — SODIUM CHLORIDE 0.9% FLUSH BAG - 250 ML: INTRAVENOUS | Status: DC | PRN

## 2024-02-16 MED ORDER — MEPERIDINE (PF) 25 MG/ML INJECTION SOLUTION: 12.5000 mg | Freq: Once | INTRAMUSCULAR | Status: DC | PRN

## 2024-02-16 MED ORDER — HYDROCORTISONE SOD SUCCINATE 100 MG/2 ML VIAL WRAPPER: 100.0000 mg | Freq: Once | INTRAMUSCULAR | Status: DC | PRN

## 2024-02-16 MED ORDER — ONDANSETRON HCL 8 MG TABLET: 8.0000 mg | ORAL_TABLET | Freq: Once | ORAL | Status: DC | PRN

## 2024-02-16 MED ORDER — ONDANSETRON HCL (PF) 4 MG/2 ML INJECTION SOLUTION: 8.0000 mg | Freq: Once | INTRAMUSCULAR | Status: DC | PRN

## 2024-02-16 MED ORDER — FAMOTIDINE (PF) 20 MG/2 ML INTRAVENOUS SOLUTION: 20.0000 mg | Freq: Once | INTRAVENOUS | Status: DC | PRN

## 2024-02-16 MED ORDER — ALBUTEROL SULFATE 2.5 MG/3 ML (0.083 %) SOLUTION FOR NEBULIZATION: 2.5000 mg | INHALATION_SOLUTION | Freq: Once | RESPIRATORY_TRACT | Status: DC | PRN

## 2024-02-16 MED ORDER — SODIUM CHLORIDE 0.9 % INTRAVENOUS SOLUTION
200.0000 mg | Freq: Once | INTRAVENOUS | Status: AC
  Administered 2024-02-16: 0 mg via INTRAVENOUS
  Administered 2024-02-16: 200 mg via INTRAVENOUS
  Filled 2024-02-16: qty 8

## 2024-02-16 MED ORDER — EPINEPHRINE 1 MG/ML (1 ML) INJECTION SOLUTION: 0.3000 mg | Freq: Once | INTRAMUSCULAR | Status: DC | PRN

## 2024-02-16 NOTE — Cancer Center Note (Signed)
 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    She is receiving adjuvant pembrolizumab  for renal cell carcinoma, with a planned twelve-month course. She denies significant side effects from immunotherapy at this time. She previously experienced gastrointestinal symptoms after a prior treatment, but currently has no nausea and her home antiemetic is effective. She reports increased appetite. No chest pain or shortness of breath beyond baseline. She has urinary frequency without dysuria or abdominal pain. She has multiple abdominal scars from prior surgeries, with discomfort attributed to scar tissue, especially when bending over. No swelling or palpable lymphadenopathy.    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 She has been stable for eight years and remains on surveillance. 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    She reports persistent shoulder pain and muscle tightness, with unclear findings on recent shoulder radiography. She uses CPAP at night and is considering a new device for breathing. She uses a walker for mobility.         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 with chemo and radiation    Chest pain, unspecified type 07/17/2023    CPAP (continuous positive airway pressure) dependence     CVA (cerebrovascular accident)      mini during brain surgery per pt    Diabetes mellitus, type 2     Diabetes mellitus, type 2     Disorder of thyroid      Dyspnea, unspecified type 07/17/2023    Epileptic seizure (CMS HCC)     Essential hypertension     GERD (gastroesophageal reflux disease) 08/02/2023    History of kidney surgery 06/2023    Pulmonary embolism 07/16/2023    Sleep apnea            Past Surgical History:   Procedure Laterality Date    BRAIN SURGERY      GASTROJEJUNOSTOMY W/ JEJUNOSTOMY TUBE      HX CHOLECYSTECTOMY      HX HERNIA REPAIR      KIDNEY SURGERY Left     removal           Social History     Socioeconomic History    Marital status: Divorced     Spouse name: Not on file    Number of children: Not on file    Years of education: Not on file    Highest education level: Not on file   Occupational History    Not on file   Tobacco Use    Smoking status: Never    Smokeless tobacco: Never   Vaping Use    Vaping status: Never Used   Substance and Sexual Activity    Alcohol  use: Never    Drug use: Never    Sexual  activity: Not Currently   Other Topics Concern    Ability to Walk 1 Flight of Steps without SOB/CP Yes    Routine Exercise No    Ability to Walk 2 Flight of Steps without SOB/CP No    Unable to Ambulate Not Asked    Total Care Not Asked    Ability To Do Own ADL's Yes    Uses Walker Not Asked    Other Activity Level Not Asked    Uses Cane Not Asked   Social History Narrative    Not on file     Social Determinants of Health     Financial Resource Strain: Low Risk (07/24/2023)    Financial Resource Strain     SDOH Financial: No   Transportation Needs: High Risk (07/24/2023)    Transportation Needs     SDOH Transportation: Yes, it has kept me from medical appointments or from getting my medications   Social Connections: Low Risk (07/24/2023)    Social Connections     SDOH Social Isolation: 5 or more times a week   Intimate Partner Violence: Not on file   Housing Stability: Low Risk (07/24/2023)    Housing Stability     SDOH Housing  Situation: I have housing.     SDOH Housing Worry: No       Social History     Social History Narrative    Not on file       Social History     Substance and Sexual Activity   Drug Use Never       Family Medical History:       Problem Relation (Age of Onset)    Arthritis-osteo Mother    Breast Cancer Paternal Grandmother    Heart Disease Father    Hypertension (High Blood Pressure) Mother    No Known Problems Sister, Brother, Maternal Grandmother, Maternal Grandfather, Paternal Grandfather, Daughter, Son, Maternal Aunt, Maternal Uncle, Paternal Aunt, Paternal Uncle, Other              Current Outpatient Medications   Medication Sig    ACCU-CHEK SOFTCLIX LANCETS Misc CHECK BLOOD SUGAR EVERY DAY AS DIRECTED    atorvastatin  (LIPITOR) 20 mg Oral Tablet Take 1 Tablet (20 mg total) by mouth Every night    ergocalciferol , vitamin D2, (DRISDOL ) 1,250 mcg (50,000 unit) Oral Capsule Take 1 Capsule (50,000 Units total) by mouth Every Sunday    escitalopram  oxalate (LEXAPRO ) 20 mg Oral Tablet Take 1 Tablet (20 mg total) by mouth Daily    famotidine  (PEPCID ) 20 mg Oral Tablet Take 1 Tablet (20 mg total) by mouth Daily    FREESTYLE LANCETS 28 gauge Misc TEST ONCE DAILY    FREESTYLE LITE METER Kit EVERY DAY    FREESTYLE LITE STRIPS Strip TEST EVERY DAY    hydroCHLOROthiazide  (HYDRODIURIL ) 25 mg Oral Tablet Take 1 Tablet (25 mg total) by mouth Every morning    levothyroxine  (SYNTHROID ) 25 mcg Oral Tablet TAKE 1 TABLET ONCE A DAY IN THE MORNING 30 MINUTES BEFORE MEALS OR ANY OTHER MEDICATIONS    losartan  (COZAAR ) 100 mg Oral Tablet Take 1 Tablet (100 mg total) by mouth Daily    magnesium  Oxide 420 mg Oral Tablet Take 1 Tablet (420 mg total) by mouth Daily    methocarbamoL  (ROBAXIN ) 500 mg Oral Tablet Take 1 Tablet (500 mg total) by mouth Four times a day as needed    ondansetron  (ZOFRAN  ODT) 4 mg Oral Tablet, Rapid Dissolve Take 1 Tablet (4 mg  total) by mouth Every 6 hours as needed for Nausea/Vomiting Indications: prevent nausea  and vomiting from cancer chemotherapy    oxyCODONE  (ROXICODONE ) 5 mg Oral Tablet Take 1 Tablet (5 mg total) by mouth Every 6 hours as needed for Pain    phenytoin  sodium extended release (DILANTIN ) 100 mg Oral Capsule TAKE 3 CAPSULES BY MOUTH TWICE A DAY    traMADoL  (ULTRAM ) 50 mg Oral Tablet Take 1 Tablet (50 mg total) by mouth Every 4 hours as needed for Pain       Allergies[1]      PHYSICAL EXAM:  BP 134/64 (Site: Right Arm, Patient Position: Sitting, Cuff Size: Adult Large)   Pulse 88   Temp 36.5 C (97.7 F) (Temporal)   Ht 1.575 m (5' 2)   Wt 93.4 kg (205 lb 12.8 oz)   SpO2 98%   BMI 37.64 kg/m        ECOG Status: (1) Restricted in physically strenuous activity, ambulatory and able to do work of light nature   Physical Exam  Vitals and nursing note reviewed.   Constitutional:       Appearance: Normal appearance.   HENT:      Head: Normocephalic.      Nose: Nose normal.      Mouth/Throat:      Mouth: Mucous membranes are moist.      Pharynx: Oropharynx is clear.   Eyes:      General: No scleral icterus.     Extraocular Movements: Extraocular movements intact.   Cardiovascular:      Rate and Rhythm: Normal rate and regular rhythm.      Pulses: Normal pulses.      Heart sounds: Normal heart sounds.   Pulmonary:      Effort: Pulmonary effort is normal.      Breath sounds: Normal breath sounds.   Abdominal:      Palpations: Abdomen is soft.   Musculoskeletal:         General: Normal range of motion.      Cervical back: Normal range of motion and neck supple.   Skin:     General: Skin is warm and dry.   Neurological:      General: No focal deficit present.      Mental Status: She is alert and oriented to person, place, and time. Mental status is at baseline.   Psychiatric:         Mood and Affect: Mood normal.         Behavior: Behavior normal.         Thought Content: Thought content normal.         Judgment: Judgment normal.         DIAGNOSTIC DATA:  Recent Results (from the past [id number] hours)   CT  ANGIO CHEST W IV CONTRAST    Collection Time: 11/27/23  1:57 PM    Narrative    ERLENE Nakajima    RADIOLOGIST: Oneil LITTIE Raw, MD    CT ANGIO CHEST W IV CONTRAST performed on 11/27/2023 1:57 PM    CLINICAL HISTORY: I26.99: Pulmonary embolism, unspecified chronicity, unspecified pulmonary embolism type, unspecified whether acute cor pulmonale present (CMS HCC)  H/O PE, DYSPNEA    TECHNIQUE: CTA imaging of the chest with intravenous contrast.  3D reconstructions.  CONTRAST:  75 ml's of Omnipaque  350    COMPARISON: 07/23/2023         FINDINGS:  Hardware:  None.    Lymph nodes:  There is an enlarged subcarinal lymph node. This is a short axis diameter of 1.9 cm in diameter. On 07/23/2023, this was normal in appearance with a short axis diameter of 0.8 cm at that time. There is right hilar lymphadenopathy present which is new since 07/23/2023. There is a 1.7 cm right hilar lymph node with an adjacent 1 cm right hilar lymph node.    Heart:  Cardiomegaly.  No pericardial effusion and coronary artery calcifications.        RV/LV Diameter Ratio: 0.9    Thoracic Aorta:  No thoracic aortic aneurysm or dissection.    Pulmonary Vessels:  No evidence of acute pulmonary emboli through the major subsegmental branches. There is some minimal residual mural based soft tissue density in the left lower lobe pulmonary artery branch consistent with chronic changes likely related to the prior pulmonary embolus which was present on 07/23/2023.     Most Proximal Level Of Embolus (if embolus present): N/A    Lungs and Airways:  Left lower lobe atelectasis    Pleura: No pleural effusion.  No pneumothorax.    Upper Abdomen: Visualized portions of the upper abdominal viscera are unremarkable.    Bones: Bone windows are unremarkable.        Impression    1. NO ACUTE PULMONARY EMBOLUS  2. THERE IS SOME MINIMAL RESIDUAL MURAL BASED SOFT TISSUE DENSITY IN THE LEFT LOWER LOBE PULMONARY ARTERY BRANCH LIKELY RELATED TO THE PRIOR PULMONARY EMBOLUS  3. THERE  IS SUBCARINAL AND RIGHT HILAR LYMPHADENOPATHY NEW SINCE 07/23/2022.          Radiologist location ID: [id number]         LABS:   CBC  Diff   Lab Results   Component Value Date/Time    WBC 6.2 01/26/2024 11:19 AM    HGB 12.9 01/26/2024 11:19 AM    HCT 36.2 01/26/2024 11:19 AM    PLTCNT 207 01/26/2024 11:19 AM    RBC 3.93 01/26/2024 11:19 AM    MCV 92.1 01/26/2024 11:19 AM    MCHC 35.6 01/26/2024 11:19 AM    MCH 32.8 01/26/2024 11:19 AM    RDW 13.7 01/26/2024 11:19 AM    MPV 7.2 (L) 01/26/2024 11:19 AM    Lab Results   Component Value Date/Time    PMNS 60 01/26/2024 11:19 AM    LYMPHOCYTES 32 01/26/2024 11:19 AM    EOSINOPHIL 0 (L) 01/26/2024 11:19 AM    MONOCYTES 7 01/26/2024 11:19 AM    BASOPHILS 1 01/26/2024 11:19 AM    BASOPHILS 0.10 01/26/2024 11:19 AM    PMNABS 3.70 01/26/2024 11:19 AM    LYMPHSABS 2.00 01/26/2024 11:19 AM    EOSABS 0.00 01/26/2024 11:19 AM    MONOSABS 0.50 01/26/2024 11:19 AM            Comprehensive Metabolic Profile    Lab Results   Component Value Date    SODIUM 142 01/26/2024    POTASSIUM 3.9 01/26/2024    CHLORIDE 111 (H) 01/26/2024    CO2 21 01/26/2024    ANIONGAP 10 01/26/2024    BUN 15 01/26/2024    CREATININE 0.74 01/26/2024    ALBUMIN  4.2 01/26/2024    CALCIUM  8.7 01/26/2024    GLUCOSENF 155 (H) 01/26/2024    ALKPHOS 107 (H) 01/26/2024    ALT 20 01/26/2024    AST 11 (L) 01/26/2024    TOTBILIRUBIN 0.3 01/26/2024    TOTALPROTEIN 6.4 01/26/2024         ESTIMATED  GFR   Date Value Ref Range Status   01/26/2024 95 >59 mL/min/1.90m2 Final       ASSESSMENT:  Assessment & Plan  Renal cell carcinoma status post resection on adjuvant immunotherapy  Status post resection for T3B renal cell carcinoma with negative margins and no lymph node involvement. Remains at risk for recurrence, receiving adjuvant pembrolizumab  for twelve months. Tolerating immunotherapy well, no significant new adverse effects. Previous gastrointestinal symptoms resolved.  - Continue pembrolizumab  to complete twelve months  of adjuvant therapy.  - Monitor for immunotherapy-related adverse effects.  - Continue surveillance for recurrence.  - Follow up in three weeks.  - Facilitate wheelchair assistance for transportation as requested.    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 In remission for eight years with persistent aphasia and word-finding difficulty.  - Continue surveillance for recurrence.  - Monitor for neurological symptoms and provide supportive care for residual aphasia.    Return in about 3 weeks (around 03/08/2024) for Dr. Moses.     Njeri Donaghy was given the chance to ask questions, and these were answered to their satisfaction. The patient is welcome to call with any questions or concerns in the meantime.     Keryl Gholson, APRN,FNP-BC ,02/16/2024 ,14:03     This note was partially generated using MModal Fluency Direct system, and there may be some incorrect words, spellings, and punctuation that were not noted in checking the note before saving.   This note was created using [employer] M*Modal Fluency Align mobile application via conversational audio. Consent for audio recording was obtained by patient/family members prior to recording.   CC:  Corean Lamer, APRN, CNP  [address]  [facility] [address]                               [1]   Allergies  Allergen Reactions    Ace Inhibitors      Other reaction(s): Angioedema

## 2024-02-16 NOTE — Nurses Notes (Signed)
 1413 Pt to OP Onc unit via wheelchair with friend to assist. Labs drawn at Oncology Clinic prior to appt with Melissa Bogle NP. VS obtained. Immunotherapy Flow Sheet complete at Oncology Clinic office. Rexene Lipps, RN  Patient Assessment/Symptom Management Patient Has No MD Appointment Today   Key: (+) Symptom present           (-)  Symptom not present If Symptom is Positive(+) a Nursing Note is required   Edema -   Uncontrolled Nausea -   Vomiting -   Inability to eat/drink -   Mouth Sores -   Diarrhea -   Constipation (? Last BM) -   Fatigue that interferes with ADL's -   Numbness/Tingling -change -   Other -   Fever/Signs & Symptoms of infection -   Nurse Initials TL RN   [phone] Port accessed with ease. Excellent blood return and flushes easily. Rexene Lipps, RN  [phone] Keytruda  200 mg IV infusion started. Rexene Lipps, RN  [phone] Keytruda  infusion completed. Line flushed with NS. Rexene Lipps, RN  [phone] VS obtained. Pt tolerated tx without difficulty. Rexene Lipps, RN  [phone] Port flushed with 30 ml NS. [place]an d/c'ed. Site clear. Drsg applied. Rexene Lipps, RN  [phone] Pt left OP Onc unit via wheelchair with volunteer to assist. No adverse reaction noted. Rexene Lipps, RN

## 2024-03-04 ENCOUNTER — Other Ambulatory Visit (INDEPENDENT_AMBULATORY_CARE_PROVIDER_SITE_OTHER): Payer: Self-pay | Admitting: HEMATOLOGY-ONCOLOGY

## 2024-03-07 ENCOUNTER — Other Ambulatory Visit: Payer: Self-pay

## 2024-03-08 ENCOUNTER — Other Ambulatory Visit: Payer: Self-pay

## 2024-03-08 ENCOUNTER — Ambulatory Visit (INDEPENDENT_AMBULATORY_CARE_PROVIDER_SITE_OTHER): Payer: Self-pay | Admitting: HEMATOLOGY-ONCOLOGY

## 2024-03-08 ENCOUNTER — Ambulatory Visit (INDEPENDENT_AMBULATORY_CARE_PROVIDER_SITE_OTHER)
Admission: RE | Admit: 2024-03-08 | Discharge: 2024-03-08 | Disposition: A | Payer: Self-pay | Source: Ambulatory Visit | Attending: HEMATOLOGY-ONCOLOGY

## 2024-03-08 ENCOUNTER — Encounter (HOSPITAL_COMMUNITY): Payer: Self-pay

## 2024-03-08 ENCOUNTER — Ambulatory Visit: Admission: RE | Admit: 2024-03-08 | Discharge: 2024-03-08 | Payer: Self-pay | Attending: HEMATOLOGY-ONCOLOGY

## 2024-03-08 ENCOUNTER — Encounter (INDEPENDENT_AMBULATORY_CARE_PROVIDER_SITE_OTHER): Payer: Self-pay | Admitting: HEMATOLOGY-ONCOLOGY

## 2024-03-08 VITALS — BP 140/68 | HR 84 | Temp 97.5°F | Ht 62.0 in | Wt 208.8 lb

## 2024-03-08 VITALS — BP 144/73 | HR 87 | Temp 97.6°F | Resp 18

## 2024-03-08 DIAGNOSIS — C642 Malignant neoplasm of left kidney, except renal pelvis: Secondary | ICD-10-CM

## 2024-03-08 DIAGNOSIS — Z86711 Personal history of pulmonary embolism: Secondary | ICD-10-CM | POA: Insufficient documentation

## 2024-03-08 DIAGNOSIS — Z5112 Encounter for antineoplastic immunotherapy: Secondary | ICD-10-CM | POA: Insufficient documentation

## 2024-03-08 DIAGNOSIS — Z85841 Personal history of malignant neoplasm of brain: Secondary | ICD-10-CM | POA: Insufficient documentation

## 2024-03-08 DIAGNOSIS — Z79899 Other long term (current) drug therapy: Secondary | ICD-10-CM | POA: Insufficient documentation

## 2024-03-08 DIAGNOSIS — Z7901 Long term (current) use of anticoagulants: Secondary | ICD-10-CM | POA: Insufficient documentation

## 2024-03-08 LAB — COMPREHENSIVE METABOLIC PANEL, NON-FASTING
ALBUMIN/GLOBULIN RATIO: 1.7 — ABNORMAL HIGH (ref 0.8–1.4)
ALBUMIN: 4.4 g/dL (ref 3.5–5.7)
ALKALINE PHOSPHATASE: 112 U/L — ABNORMAL HIGH (ref 34–104)
ALT (SGPT): 23 U/L (ref 7–52)
ANION GAP: 8 mmol/L (ref 4–13)
AST (SGOT): 14 U/L (ref 13–39)
BILIRUBIN TOTAL: 0.3 mg/dL (ref 0.3–1.0)
BUN/CREA RATIO: 17 (ref 6–22)
BUN: 13 mg/dL (ref 7–25)
CALCIUM, CORRECTED: 9.1 mg/dL (ref 8.9–10.8)
CALCIUM: 9.4 mg/dL (ref 8.6–10.3)
CHLORIDE: 106 mmol/L (ref 98–107)
CO2 TOTAL: 27 mmol/L (ref 21–31)
CREATININE: 0.78 mg/dL (ref 0.60–1.30)
ESTIMATED GFR: 90 mL/min/{1.73_m2} (ref >59)
GLOBULIN: 2.6 (ref 2.0–3.5)
GLUCOSE: 136 mg/dL — ABNORMAL HIGH (ref 74–109)
OSMOLALITY, CALCULATED: 284 mosm/kg (ref 270–290)
POTASSIUM: 4.2 mmol/L (ref 3.5–5.1)
PROTEIN TOTAL: 7 g/dL (ref 6.4–8.9)
SODIUM: 141 mmol/L (ref 136–145)

## 2024-03-08 LAB — CBC WITH DIFF
BASOPHIL #: 0.1 10*3/uL (ref 0.00–0.10)
BASOPHIL %: 1 % (ref 0–1)
EOSINOPHIL #: 0.2 10*3/uL (ref 0.00–0.50)
EOSINOPHIL %: 2 % (ref 1–7)
HCT: 37.9 % (ref 31.2–41.9)
HGB: 13.7 g/dL (ref 10.9–14.3)
LYMPHOCYTE #: 2.1 10*3/uL (ref 1.10–3.10)
LYMPHOCYTE %: 28 % (ref 16–46)
MCH: 33.4 pg — ABNORMAL HIGH (ref 24.7–32.8)
MCHC: 36 g/dL — ABNORMAL HIGH (ref 32.3–35.6)
MCV: 92.5 fL (ref 75.5–95.3)
MONOCYTE #: 0.6 10*3/uL (ref 0.20–0.90)
MONOCYTE %: 8 % (ref 4–11)
MPV: 7.2 fL — ABNORMAL LOW (ref 7.9–10.8)
NEUTROPHIL #: 4.7 10*3/uL (ref 1.90–8.20)
NEUTROPHIL %: 61 % (ref 43–77)
PLATELETS: 223 10*3/uL (ref 140–440)
RBC: 4.1 10*6/uL (ref 3.63–4.92)
RDW: 13.9 % (ref 12.3–17.7)
WBC: 7.7 10*3/uL (ref 3.8–11.8)

## 2024-03-08 LAB — MAGNESIUM: MAGNESIUM: 1.9 mg/dL (ref 1.9–2.7)

## 2024-03-08 LAB — THYROID STIMULATING HORMONE WITH FREE T4 REFLEX: TSH: 3.774 u[IU]/mL (ref 0.450–5.330)

## 2024-03-08 MED ORDER — ONDANSETRON HCL (PF) 4 MG/2 ML INJECTION SOLUTION: 8.0000 mg | Freq: Once | INTRAMUSCULAR | Status: DC | PRN

## 2024-03-08 MED ORDER — ALBUTEROL SULFATE 2.5 MG/3 ML (0.083 %) SOLUTION FOR NEBULIZATION: 2.5000 mg | INHALATION_SOLUTION | Freq: Once | RESPIRATORY_TRACT | Status: DC | PRN

## 2024-03-08 MED ORDER — DEXTROSE 5% IN WATER (D5W) FLUSH BAG - 250 ML: INTRAVENOUS | Status: DC | PRN

## 2024-03-08 MED ORDER — SODIUM CHLORIDE 0.9 % INTRAVENOUS SOLUTION
200.0000 mg | Freq: Once | INTRAVENOUS | Status: AC
  Administered 2024-03-08: 0 mg via INTRAVENOUS
  Administered 2024-03-08: 200 mg via INTRAVENOUS
  Filled 2024-03-08: qty 8

## 2024-03-08 MED ORDER — SODIUM CHLORIDE 0.9 % IV BOLUS: 500.0000 mL | INJECTION | Freq: Once | Status: DC | PRN

## 2024-03-08 MED ORDER — HYDROCORTISONE SOD SUCCINATE 100 MG/2 ML VIAL WRAPPER: 100.0000 mg | Freq: Once | INTRAMUSCULAR | Status: DC | PRN

## 2024-03-08 MED ORDER — ONDANSETRON HCL 8 MG TABLET: 8.0000 mg | ORAL_TABLET | Freq: Once | ORAL | Status: DC | PRN

## 2024-03-08 MED ORDER — FAMOTIDINE (PF) 20 MG/2 ML INTRAVENOUS SOLUTION: 20.0000 mg | Freq: Once | INTRAVENOUS | Status: DC | PRN

## 2024-03-08 MED ORDER — SODIUM CHLORIDE 0.9% FLUSH BAG - 250 ML: INTRAVENOUS | Status: DC | PRN

## 2024-03-08 MED ORDER — MEPERIDINE (PF) 25 MG/ML INJECTION SOLUTION: 12.5000 mg | Freq: Once | INTRAMUSCULAR | Status: DC | PRN

## 2024-03-08 MED ORDER — DIPHENHYDRAMINE 50 MG/ML INJECTION SOLUTION: 25.0000 mg | Freq: Once | INTRAMUSCULAR | Status: DC | PRN

## 2024-03-08 MED ORDER — EPINEPHRINE 1 MG/ML (1 ML) INJECTION SOLUTION: 0.3000 mg | Freq: Once | INTRAMUSCULAR | Status: DC | PRN

## 2024-03-08 MED ORDER — ALBUTEROL SULFATE HFA 90 MCG/ACTUATION AEROSOL INHALER - RN: 2.0000 | Freq: Once | RESPIRATORY_TRACT | Status: DC | PRN

## 2024-03-08 MED ORDER — DIPHENHYDRAMINE 50 MG/ML INJECTION SOLUTION: 50.0000 mg | Freq: Once | INTRAMUSCULAR | Status: DC | PRN

## 2024-03-08 NOTE — Nurses Notes (Signed)
 1230 - Patient ambulatory to unit with use of walker. Assessment complete. Immunotherapy flow sheet completed. Raynelle Rhein, RN  Patient Assessment/Symptom Management Patient Has MD Appointment Today   Key: (+) Symptom present           (-)  Symptom not present If Symptom is Positive(+) a Nursing Note is required   Edema -   Uncontrolled Nausea -   Vomiting -   Inability to eat/drink -   Mouth Sores -   Diarrhea -   Constipation (? Last BM) -   Fatigue that interferes with ADL's -   Numbness/Tingling -change -   Other -   Fever/Signs & Symptoms of infection -   Nurse Initials JD     1240 - PAC accessed with ease and excellent blood return noted. Tailynn Armetta, RN  [phone] - Keytruda  infusion started. Raynelle Rhein, RN  [phone] - Keytruda  infusion complete. Line flushing. Raynelle Rhein, RN  1420 - PAC flushed with 30ml NS and deaccessed. Excellent blood return noted. Raynelle Rhein, RN  [phone] - Patient left unit via ambulation with walker at this time. Garry Bochicchio, RN

## 2024-03-29 ENCOUNTER — Ambulatory Visit (HOSPITAL_COMMUNITY): Payer: Self-pay

## 2024-03-29 ENCOUNTER — Ambulatory Visit (INDEPENDENT_AMBULATORY_CARE_PROVIDER_SITE_OTHER): Payer: Self-pay | Admitting: HEMATOLOGY-ONCOLOGY
